# Patient Record
Sex: Male | Born: 1935 | Race: White | Hispanic: No | Marital: Married | Smoking: Former smoker
Health system: Southern US, Community
[De-identification: ages and names within clinical notes are randomized; demographics above are authoritative.]

## PROBLEM LIST (undated history)

## (undated) DIAGNOSIS — K5909 Other constipation: Secondary | ICD-10-CM

## (undated) DIAGNOSIS — I10 Essential (primary) hypertension: Secondary | ICD-10-CM

## (undated) DIAGNOSIS — Z9289 Personal history of other medical treatment: Secondary | ICD-10-CM

## (undated) DIAGNOSIS — N289 Disorder of kidney and ureter, unspecified: Secondary | ICD-10-CM

## (undated) DIAGNOSIS — C439 Malignant melanoma of skin, unspecified: Secondary | ICD-10-CM

## (undated) DIAGNOSIS — N189 Chronic kidney disease, unspecified: Secondary | ICD-10-CM

## (undated) DIAGNOSIS — R918 Other nonspecific abnormal finding of lung field: Secondary | ICD-10-CM

## (undated) HISTORY — DX: Malignant melanoma of skin, unspecified: C43.9

## (undated) HISTORY — DX: Other constipation: K59.09

## (undated) HISTORY — DX: Personal history of other medical treatment: Z92.89

## (undated) HISTORY — DX: Chronic kidney disease, unspecified: N18.9

## (undated) HISTORY — DX: Disorder of kidney and ureter, unspecified: N28.9

## (undated) HISTORY — DX: Essential (primary) hypertension: I10

## (undated) HISTORY — PX: GANGLION CYST EXCISION: SHX1691

## (undated) HISTORY — DX: Other nonspecific abnormal finding of lung field: R91.8

---

## 2000-07-03 ENCOUNTER — Encounter: Payer: Self-pay | Admitting: Family Medicine

## 2003-05-12 ENCOUNTER — Encounter: Admission: RE | Admit: 2003-05-12 | Discharge: 2003-05-12 | Payer: Self-pay | Admitting: Family Medicine

## 2003-07-31 ENCOUNTER — Encounter: Admission: RE | Admit: 2003-07-31 | Discharge: 2003-07-31 | Payer: Self-pay | Admitting: Family Medicine

## 2004-02-08 ENCOUNTER — Encounter: Admission: RE | Admit: 2004-02-08 | Discharge: 2004-02-08 | Payer: Self-pay | Admitting: Family Medicine

## 2004-03-09 ENCOUNTER — Ambulatory Visit: Payer: Self-pay | Admitting: Family Medicine

## 2004-05-11 ENCOUNTER — Ambulatory Visit: Payer: Self-pay | Admitting: Family Medicine

## 2004-08-03 ENCOUNTER — Ambulatory Visit: Payer: Self-pay | Admitting: Family Medicine

## 2004-08-18 ENCOUNTER — Ambulatory Visit: Payer: Self-pay | Admitting: Family Medicine

## 2004-09-29 ENCOUNTER — Ambulatory Visit: Payer: Self-pay | Admitting: Family Medicine

## 2005-02-07 ENCOUNTER — Ambulatory Visit: Payer: Self-pay | Admitting: Family Medicine

## 2005-02-09 ENCOUNTER — Ambulatory Visit: Payer: Self-pay | Admitting: Family Medicine

## 2005-02-24 ENCOUNTER — Ambulatory Visit: Payer: Self-pay

## 2005-03-06 ENCOUNTER — Ambulatory Visit: Payer: Self-pay | Admitting: Family Medicine

## 2005-06-12 ENCOUNTER — Ambulatory Visit: Payer: Self-pay | Admitting: Family Medicine

## 2005-08-14 ENCOUNTER — Ambulatory Visit: Payer: Self-pay | Admitting: Family Medicine

## 2005-08-16 ENCOUNTER — Ambulatory Visit: Payer: Self-pay | Admitting: Family Medicine

## 2006-02-16 ENCOUNTER — Ambulatory Visit: Payer: Self-pay | Admitting: Family Medicine

## 2006-02-16 LAB — CONVERTED CEMR LAB: PSA: 1.93 ng/mL

## 2006-02-27 ENCOUNTER — Ambulatory Visit: Payer: Self-pay | Admitting: Family Medicine

## 2006-03-01 ENCOUNTER — Ambulatory Visit: Payer: Self-pay | Admitting: Family Medicine

## 2006-03-13 ENCOUNTER — Ambulatory Visit: Payer: Self-pay | Admitting: Family Medicine

## 2006-05-29 ENCOUNTER — Ambulatory Visit: Payer: Self-pay | Admitting: Nephrology

## 2006-08-10 ENCOUNTER — Encounter: Payer: Self-pay | Admitting: Family Medicine

## 2006-08-10 DIAGNOSIS — N289 Disorder of kidney and ureter, unspecified: Secondary | ICD-10-CM | POA: Insufficient documentation

## 2006-08-10 DIAGNOSIS — I10 Essential (primary) hypertension: Secondary | ICD-10-CM | POA: Insufficient documentation

## 2006-08-10 DIAGNOSIS — E7800 Pure hypercholesterolemia, unspecified: Secondary | ICD-10-CM | POA: Insufficient documentation

## 2006-08-10 DIAGNOSIS — Z85828 Personal history of other malignant neoplasm of skin: Secondary | ICD-10-CM | POA: Insufficient documentation

## 2006-08-10 DIAGNOSIS — G2581 Restless legs syndrome: Secondary | ICD-10-CM | POA: Insufficient documentation

## 2006-08-10 DIAGNOSIS — J309 Allergic rhinitis, unspecified: Secondary | ICD-10-CM | POA: Insufficient documentation

## 2006-09-03 ENCOUNTER — Encounter: Payer: Self-pay | Admitting: Family Medicine

## 2006-12-04 ENCOUNTER — Ambulatory Visit: Payer: Self-pay | Admitting: Internal Medicine

## 2007-02-25 ENCOUNTER — Telehealth: Payer: Self-pay | Admitting: Family Medicine

## 2007-02-25 ENCOUNTER — Encounter: Payer: Self-pay | Admitting: Family Medicine

## 2007-04-23 ENCOUNTER — Telehealth: Payer: Self-pay | Admitting: Family Medicine

## 2007-05-28 ENCOUNTER — Ambulatory Visit: Payer: Self-pay | Admitting: Family Medicine

## 2007-05-29 LAB — CONVERTED CEMR LAB
ALT: 25 units/L (ref 0–53)
AST: 22 units/L (ref 0–37)
Albumin: 3.6 g/dL (ref 3.5–5.2)
BUN: 19 mg/dL (ref 6–23)
Basophils Absolute: 0.1 10*3/uL (ref 0.0–0.1)
Basophils Relative: 1.2 % — ABNORMAL HIGH (ref 0.0–1.0)
CO2: 26 meq/L (ref 19–32)
Calcium: 8.9 mg/dL (ref 8.4–10.5)
Chloride: 107 meq/L (ref 96–112)
Cholesterol: 175 mg/dL (ref 0–200)
Creatinine, Ser: 1.4 mg/dL (ref 0.4–1.5)
Eosinophils Absolute: 1 10*3/uL — ABNORMAL HIGH (ref 0.0–0.6)
Eosinophils Relative: 12 % — ABNORMAL HIGH (ref 0.0–5.0)
GFR calc Af Amer: 63 mL/min
GFR calc non Af Amer: 52 mL/min
Glucose, Bld: 94 mg/dL (ref 70–99)
HCT: 46.5 % (ref 39.0–52.0)
HDL: 35.5 mg/dL — ABNORMAL LOW (ref >39.0)
Hemoglobin: 15.4 g/dL (ref 13.0–17.0)
LDL Cholesterol: 118 mg/dL — ABNORMAL HIGH (ref 0–99)
Lymphocytes Relative: 26.5 % (ref 12.0–46.0)
MCHC: 33.2 g/dL (ref 30.0–36.0)
MCV: 91.3 fL (ref 78.0–100.0)
Monocytes Absolute: 0.6 10*3/uL (ref 0.2–0.7)
Monocytes Relative: 7 % (ref 3.0–11.0)
Neutro Abs: 4.3 10*3/uL (ref 1.4–7.7)
Neutrophils Relative %: 53.3 % (ref 43.0–77.0)
PSA: 1.57 ng/mL (ref 0.10–4.00)
Phosphorus: 3.1 mg/dL (ref 2.3–4.6)
Platelets: 203 10*3/uL (ref 150–400)
Potassium: 4.1 meq/L (ref 3.5–5.1)
RBC: 5.1 M/uL (ref 4.22–5.81)
RDW: 12.9 % (ref 11.5–14.6)
Sodium: 139 meq/L (ref 135–145)
TSH: 3.2 microintl units/mL (ref 0.35–5.50)
Total CHOL/HDL Ratio: 4.9
Triglycerides: 110 mg/dL (ref 0–149)
VLDL: 22 mg/dL (ref 0–40)
WBC: 8.2 10*3/uL (ref 4.5–10.5)

## 2007-06-04 ENCOUNTER — Ambulatory Visit: Payer: Self-pay | Admitting: Family Medicine

## 2007-06-05 LAB — FECAL OCCULT BLOOD, GUAIAC: Fecal Occult Blood: NEGATIVE

## 2007-09-16 ENCOUNTER — Encounter: Payer: Self-pay | Admitting: Family Medicine

## 2007-09-18 ENCOUNTER — Encounter: Payer: Self-pay | Admitting: Family Medicine

## 2007-09-26 ENCOUNTER — Ambulatory Visit: Payer: Self-pay | Admitting: Family Medicine

## 2007-09-30 DIAGNOSIS — Z9289 Personal history of other medical treatment: Secondary | ICD-10-CM

## 2007-09-30 HISTORY — DX: Personal history of other medical treatment: Z92.89

## 2007-09-30 HISTORY — PX: OTHER SURGICAL HISTORY: SHX169

## 2007-10-04 ENCOUNTER — Ambulatory Visit: Payer: Self-pay | Admitting: Family Medicine

## 2007-10-04 DIAGNOSIS — R609 Edema, unspecified: Secondary | ICD-10-CM | POA: Insufficient documentation

## 2007-10-04 DIAGNOSIS — H1045 Other chronic allergic conjunctivitis: Secondary | ICD-10-CM | POA: Insufficient documentation

## 2007-10-07 DIAGNOSIS — R930 Abnormal findings on diagnostic imaging of skull and head, not elsewhere classified: Secondary | ICD-10-CM | POA: Insufficient documentation

## 2007-10-10 ENCOUNTER — Encounter: Payer: Self-pay | Admitting: Family Medicine

## 2007-10-30 ENCOUNTER — Encounter: Payer: Self-pay | Admitting: Family Medicine

## 2007-10-30 ENCOUNTER — Ambulatory Visit: Payer: Self-pay

## 2007-11-14 ENCOUNTER — Ambulatory Visit: Payer: Self-pay | Admitting: Family Medicine

## 2007-11-15 LAB — CONVERTED CEMR LAB
Albumin: 3.9 g/dL (ref 3.5–5.2)
BUN: 20 mg/dL (ref 6–23)
CO2: 24 meq/L (ref 19–32)
Calcium: 9.2 mg/dL (ref 8.4–10.5)
Chloride: 104 meq/L (ref 96–112)
Creatinine, Ser: 1.4 mg/dL (ref 0.4–1.5)
GFR calc Af Amer: 63 mL/min
GFR calc non Af Amer: 52 mL/min
Glucose, Bld: 96 mg/dL (ref 70–99)
Phosphorus: 3.2 mg/dL (ref 2.3–4.6)
Potassium: 4.7 meq/L (ref 3.5–5.1)
Sed Rate: 8 mm/hr (ref 0–16)
Sodium: 137 meq/L (ref 135–145)

## 2007-11-18 ENCOUNTER — Encounter: Payer: Self-pay | Admitting: Family Medicine

## 2007-11-22 ENCOUNTER — Ambulatory Visit: Payer: Self-pay | Admitting: Family Medicine

## 2007-11-25 ENCOUNTER — Telehealth: Payer: Self-pay | Admitting: Family Medicine

## 2007-11-27 ENCOUNTER — Encounter: Payer: Self-pay | Admitting: Family Medicine

## 2007-12-30 ENCOUNTER — Ambulatory Visit: Payer: Self-pay | Admitting: Family Medicine

## 2008-01-10 ENCOUNTER — Telehealth (INDEPENDENT_AMBULATORY_CARE_PROVIDER_SITE_OTHER): Payer: Self-pay | Admitting: *Deleted

## 2008-02-24 ENCOUNTER — Ambulatory Visit: Payer: Self-pay | Admitting: Family Medicine

## 2008-02-25 LAB — CONVERTED CEMR LAB
ALT: 23 units/L (ref 0–53)
AST: 22 units/L (ref 0–37)
Albumin: 3.6 g/dL (ref 3.5–5.2)
Alkaline Phosphatase: 76 units/L (ref 39–117)
BUN: 21 mg/dL (ref 6–23)
Bilirubin, Direct: 0.1 mg/dL (ref 0.0–0.3)
CO2: 29 meq/L (ref 19–32)
Calcium: 8.7 mg/dL (ref 8.4–10.5)
Chloride: 110 meq/L (ref 96–112)
Cholesterol: 169 mg/dL (ref 0–200)
Creatinine, Ser: 1.3 mg/dL (ref 0.4–1.5)
GFR calc Af Amer: 68 mL/min
GFR calc non Af Amer: 56 mL/min
Glucose, Bld: 94 mg/dL (ref 70–99)
HDL: 36.6 mg/dL — ABNORMAL LOW (ref >39.0)
LDL Cholesterol: 116 mg/dL — ABNORMAL HIGH (ref 0–99)
Phosphorus: 2.8 mg/dL (ref 2.3–4.6)
Potassium: 4.3 meq/L (ref 3.5–5.1)
Sodium: 144 meq/L (ref 135–145)
Total Bilirubin: 0.8 mg/dL (ref 0.3–1.2)
Total CHOL/HDL Ratio: 4.6
Total Protein: 6.5 g/dL (ref 6.0–8.3)
Triglycerides: 81 mg/dL (ref 0–149)
VLDL: 16 mg/dL (ref 0–40)

## 2008-03-02 ENCOUNTER — Ambulatory Visit: Payer: Self-pay | Admitting: Family Medicine

## 2008-06-23 ENCOUNTER — Encounter: Payer: Self-pay | Admitting: Family Medicine

## 2008-07-22 ENCOUNTER — Encounter: Payer: Self-pay | Admitting: Family Medicine

## 2008-09-01 ENCOUNTER — Encounter (INDEPENDENT_AMBULATORY_CARE_PROVIDER_SITE_OTHER): Payer: Self-pay | Admitting: *Deleted

## 2008-09-03 ENCOUNTER — Ambulatory Visit: Payer: Self-pay | Admitting: Family Medicine

## 2008-10-14 ENCOUNTER — Ambulatory Visit: Payer: Self-pay | Admitting: Family Medicine

## 2009-02-01 ENCOUNTER — Ambulatory Visit: Payer: Self-pay | Admitting: Family Medicine

## 2009-02-02 LAB — CONVERTED CEMR LAB
ALT: 18 units/L (ref 0–53)
AST: 19 units/L (ref 0–37)
Albumin: 3.7 g/dL (ref 3.5–5.2)
BUN: 25 mg/dL — ABNORMAL HIGH (ref 6–23)
Basophils Absolute: 0 10*3/uL (ref 0.0–0.1)
Basophils Relative: 0.1 % (ref 0.0–3.0)
CO2: 29 meq/L (ref 19–32)
Calcium: 9.1 mg/dL (ref 8.4–10.5)
Chloride: 110 meq/L (ref 96–112)
Cholesterol: 156 mg/dL (ref 0–200)
Creatinine, Ser: 1.3 mg/dL (ref 0.4–1.5)
Eosinophils Absolute: 0.5 10*3/uL (ref 0.0–0.7)
Eosinophils Relative: 6.6 % — ABNORMAL HIGH (ref 0.0–5.0)
GFR calc non Af Amer: 56.17 mL/min (ref >60)
Glucose, Bld: 90 mg/dL (ref 70–99)
HCT: 45.7 % (ref 39.0–52.0)
HDL: 36.2 mg/dL — ABNORMAL LOW (ref >39.00)
Hemoglobin: 15.4 g/dL (ref 13.0–17.0)
LDL Cholesterol: 103 mg/dL — ABNORMAL HIGH (ref 0–99)
Lymphocytes Relative: 28 % (ref 12.0–46.0)
Lymphs Abs: 2.2 10*3/uL (ref 0.7–4.0)
MCHC: 33.7 g/dL (ref 30.0–36.0)
MCV: 93.1 fL (ref 78.0–100.0)
Monocytes Absolute: 0.6 10*3/uL (ref 0.1–1.0)
Monocytes Relative: 7.3 % (ref 3.0–12.0)
Neutro Abs: 4.5 10*3/uL (ref 1.4–7.7)
Neutrophils Relative %: 58 % (ref 43.0–77.0)
Phosphorus: 2.8 mg/dL (ref 2.3–4.6)
Platelets: 171 10*3/uL (ref 150.0–400.0)
Potassium: 4.6 meq/L (ref 3.5–5.1)
RBC: 4.9 M/uL (ref 4.22–5.81)
RDW: 12.7 % (ref 11.5–14.6)
Sodium: 143 meq/L (ref 135–145)
TSH: 2.82 microintl units/mL (ref 0.35–5.50)
Total CHOL/HDL Ratio: 4
Triglycerides: 86 mg/dL (ref 0.0–149.0)
VLDL: 17.2 mg/dL (ref 0.0–40.0)
WBC: 7.8 10*3/uL (ref 4.5–10.5)

## 2009-06-09 ENCOUNTER — Ambulatory Visit: Payer: Self-pay | Admitting: Family Medicine

## 2009-06-09 DIAGNOSIS — R42 Dizziness and giddiness: Secondary | ICD-10-CM | POA: Insufficient documentation

## 2009-07-26 ENCOUNTER — Encounter: Payer: Self-pay | Admitting: Family Medicine

## 2010-01-06 ENCOUNTER — Ambulatory Visit: Payer: Self-pay | Admitting: Family Medicine

## 2010-01-06 LAB — CONVERTED CEMR LAB
ALT: 19 units/L (ref 0–53)
AST: 20 units/L (ref 0–37)
Albumin: 3.7 g/dL (ref 3.5–5.2)
BUN: 21 mg/dL (ref 6–23)
Basophils Absolute: 0.1 10*3/uL (ref 0.0–0.1)
Basophils Relative: 0.6 % (ref 0.0–3.0)
CO2: 28 meq/L (ref 19–32)
Calcium: 9 mg/dL (ref 8.4–10.5)
Chloride: 108 meq/L (ref 96–112)
Cholesterol: 152 mg/dL (ref 0–200)
Creatinine, Ser: 1.2 mg/dL (ref 0.4–1.5)
Eosinophils Absolute: 0.4 10*3/uL (ref 0.0–0.7)
Eosinophils Relative: 4.3 % (ref 0.0–5.0)
GFR calc non Af Amer: 60.88 mL/min (ref >60)
Glucose, Bld: 87 mg/dL (ref 70–99)
HCT: 42.2 % (ref 39.0–52.0)
HDL: 39.7 mg/dL (ref >39.00)
Hemoglobin: 14.3 g/dL (ref 13.0–17.0)
LDL Cholesterol: 103 mg/dL — ABNORMAL HIGH (ref 0–99)
Lymphocytes Relative: 29.4 % (ref 12.0–46.0)
Lymphs Abs: 2.4 10*3/uL (ref 0.7–4.0)
MCHC: 33.8 g/dL (ref 30.0–36.0)
MCV: 93.3 fL (ref 78.0–100.0)
Monocytes Absolute: 0.6 10*3/uL (ref 0.1–1.0)
Monocytes Relative: 6.7 % (ref 3.0–12.0)
Neutro Abs: 4.9 10*3/uL (ref 1.4–7.7)
Neutrophils Relative %: 59 % (ref 43.0–77.0)
Phosphorus: 3 mg/dL (ref 2.3–4.6)
Platelets: 186 10*3/uL (ref 150.0–400.0)
Potassium: 4.1 meq/L (ref 3.5–5.1)
RBC: 4.53 M/uL (ref 4.22–5.81)
RDW: 13.6 % (ref 11.5–14.6)
Sodium: 144 meq/L (ref 135–145)
TSH: 3.1 microintl units/mL (ref 0.35–5.50)
Total CHOL/HDL Ratio: 4
Triglycerides: 49 mg/dL (ref 0.0–149.0)
VLDL: 9.8 mg/dL (ref 0.0–40.0)
WBC: 8.3 10*3/uL (ref 4.5–10.5)

## 2010-01-12 ENCOUNTER — Ambulatory Visit: Payer: Self-pay | Admitting: Family Medicine

## 2010-01-12 DIAGNOSIS — K5909 Other constipation: Secondary | ICD-10-CM | POA: Insufficient documentation

## 2010-01-24 ENCOUNTER — Ambulatory Visit: Payer: Self-pay | Admitting: Family Medicine

## 2010-02-28 ENCOUNTER — Encounter: Payer: Self-pay | Admitting: Family Medicine

## 2010-03-18 ENCOUNTER — Ambulatory Visit: Payer: Self-pay | Admitting: Gastroenterology

## 2010-03-18 ENCOUNTER — Encounter: Payer: Self-pay | Admitting: Family Medicine

## 2010-03-18 LAB — HM COLONOSCOPY: HM Colonoscopy: NORMAL

## 2010-05-31 NOTE — Procedures (Signed)
Summary: Colonoscopy by Dr.[facility]  Colonoscopy by Dr.Martin [facility]   Imported By: Beau Fanny 03/23/2010 16:35:12  _____________________________________________________________________  External Attachment:    Type:   Image     Comment:   External Document  Appended Document: Colonoscopy by Dr.[facility]     Clinical Lists Changes  Observations: Added new observation of PAST SURG HX: EGD, Colonoscopy- neg. 3/02 nl colonoscopy Ganglion cyst removal- hands 2D Echo- mild LVH (05/2002) Pulmonary nodules on CT (07/2003) 6 month follow-up CT- stable pulmonary nodules (01/2004) Stress Myoview- neg. (01/2005) Renal US- neg., benign app. cysts (05/2006) CT chest with calcification consistant with asbestos exp, also pleural nodule(6/09)- rec re check 6 mo, incidental gallstones also seen on exam 2D echo 6/09 slt thickening of aortic valve with EF of 60 % CT of head- small vessel isch change, some sinus polyps 2011 redundant colon on colonoscopy  (03/27/2010 13:22) Added new observation of PAST MED HX: Allergic rhinitis Blood transfusion Skin cancer, hx of- melanoma by right eye Hypertension Renal insufficiency chronic kidney diseas stage 3 pulmonary nodules (being obs with serial CT) chronic constipation  renal Dr Dehmer-- Lucienne Minks  GI- Dr Marva Panda (03/27/2010 13:22) Added new observation of COLONOSCOPY: normal (03/18/2010 13:22)       Preventive Care Screening  Colonoscopy:    Date:  03/18/2010    Results:  normal   Past History:  Past Medical History: Allergic rhinitis Blood transfusion Skin cancer, hx of- melanoma by right eye Hypertension Renal insufficiency chronic kidney diseas stage 3 pulmonary nodules (being obs with serial CT) chronic constipation  renal Dr Dehmer-- Lucienne Minks  GI- Dr Marva Panda  Past Surgical History: EGD, Colonoscopy- neg. 3/02 nl colonoscopy Ganglion cyst removal- hands 2D Echo- mild LVH  (05/2002) Pulmonary nodules on CT (07/2003) 6 month follow-up CT- stable pulmonary nodules (01/2004) Stress Myoview- neg. (01/2005) Renal US- neg., benign app. cysts (05/2006) CT chest with calcification consistant with asbestos exp, also pleural nodule(6/09)- rec re check 6 mo, incidental gallstones also seen on exam 2D echo 6/09 slt thickening of aortic valve with EF of 60 % CT of head- small vessel isch change, some sinus polyps 2011 redundant colon on colonoscopy

## 2010-05-31 NOTE — Assessment & Plan Note (Signed)
Summary: ROA 6 MTHS CYD   Vital Signs:  Patient profile:   77 year old male Height:      66.75 inches Weight:      163.25 pounds BMI:     25.85 Temp:     98.3 degrees F oral Pulse rate:   64 / minute Pulse rhythm:   regular BP sitting:   126 / 80  (left arm) Cuff size:   regular  Vitals Entered By: Lewanda Rife LPN (January 12, 2010 8:10 AM) CC: six month f/u   History of Present Illness: here for f/u of HTN and lipids and renal insuff  wt is down 8 lb with good bmi of 25  renal insuff is stable with cr 1.2-- thought to be HTN and age rel sees renal  was changed to chorthalidone for HTN and edema -- he stopped it due to too frequent urination was on lasix and doxasosin got new machine at home and bp are generally 114-130s/80s   (one time 138/90- then it came down)  bp today is 126/80  just on the amlodipine now  not swollen at home -- has hardly needed any lasix in the last year -- only uses for swelling  he thinks his new machine is much more accurate  bp today is excellent     wants flu shot today   lipids stable Last Lipid ProfileCholesterol: 152 (01/06/2010 8:48:18 AM)HDL:  39.70 (01/06/2010 8:48:18 AM)LDL:  103 (01/06/2010 8:48:18 AM)Triglycerides:  Last Liver profileSGOT:  20 (01/06/2010 8:48:18 AM)SPGT:  19 (01/06/2010 8:48:18 AM)T. Bili:  0.8 (02/24/2008 11:06:00 AM)Alk Phos:  76 (02/24/2008 11:06:00 AM)   bp good today at 126/80  pneumovax up to date   ? zoster status    Allergies: 1)  ! Penicillin 2)  ! Codeine 3)  ! Iodine 4)  ! * Chlorthalidone 5)  * Altace 6)  * Requip  Past History:  Past Surgical History: Last updated: 11/20/2007 EGD, Colonoscopy- neg. 3/02 nl colonoscopy Ganglion cyst removal- hands 2D Echo- mild LVH (05/2002) Pulmonary nodules on CT (07/2003) 6 month follow-up CT- stable pulmonary nodules (01/2004) Stress Myoview- neg. (01/2005) Renal US- neg., benign app. cysts (05/2006) CT chest with calcification consistant  with asbestos exp, also pleural nodule(6/09)- rec re check 6 mo, incidental gallstones also seen on exam 2D echo 6/09 slt thickening of aortic valve with EF of 60 % CT of head- small vessel isch change, some sinus polyps  Family History: Last updated: 05/28/2007 father HTN, CVA mother HTN brother with pancreatic ca  Social History: Last updated: 10/04/2007 quit smoking in 1957 Patient is a former smoker. --quit in 1950s plays music with gospel/bluegrass group  Risk Factors: Smoking Status: quit (08/10/2006)  Past Medical History: Allergic rhinitis Blood transfusion Skin cancer, hx of- melanoma by right eye Hypertension Renal insufficiency chronic kidney diseas stage 3 pulmonary nodules (being obs with serial CT) chronic constipation  renal Dr Dehmer-- Lucienne Minks   Review of Systems General:  Denies chills, fatigue, fever, loss of appetite, and malaise. Eyes:  Denies blurring. CV:  Denies chest pain or discomfort, lightheadness, and palpitations. Resp:  Denies cough, shortness of breath, and wheezing. GI:  Denies abdominal pain, change in bowel habits, indigestion, and nausea. MS:  Complains of stiffness; denies muscle aches, cramps, and muscle weakness. Derm:  Denies itching, lesion(s), poor wound healing, and rash. Neuro:  Denies numbness and tingling. Psych:  Denies anxiety and depression. Endo:  Denies cold intolerance, excessive thirst, excessive urination, and heat intolerance.  Heme:  Denies abnormal bruising and bleeding.  Physical Exam  General:  elderly male in no distress  Head:  normocephalic, atraumatic, and no abnormalities observed.   Eyes:  vision grossly intact, pupils equal, pupils round, and pupils reactive to light.  no conjunctival pallor, injection or icterus  Ears:  R ear normal and L ear normal.   Neck:  supple with full rom and no masses or thyromegally, no JVD or carotid bruit  Lungs:  Normal respiratory effort, chest expands symmetrically. Lungs  are clear to auscultation, no crackles or wheezes. Heart:  Normal rate and regular rhythm. S1 and S2 normal without gallop, murmur, click, rub or other extra sounds. Abdomen:  Bowel sounds positive,abdomen soft and non-tender without masses, organomegaly or hernias noted. no renal bruits  Msk:  No deformity or scoliosis noted of thoracic or lumbar spine.   Pulses:  R and L carotid,radial,femoral,dorsalis pedis and posterior tibial pulses are full and equal bilaterally Extremities:  trace pedal edema bilat   Neurologic:  sensation intact to light touch, gait normal, and DTRs symmetrical and normal.   Skin:  Intact without suspicious lesions or rashes Cervical Nodes:  No lymphadenopathy noted Inguinal Nodes:  No significant adenopathy Psych:  normal affect, talkative and pleasant    Impression & Recommendations:  Problem # 1:  Hx of HYPERCHOLESTEROLEMIA (ICD-272.0) Assessment Unchanged  this is stable on lipitor and diet  rev low sat fat diet and emph imp of exercise 6 mo lab and f/u for check up planned His updated medication list for this problem includes:    Lipitor 40 Mg Tabs (Atorvastatin calcium) .Marland Kitchen... Take one by mouth daily  Labs Reviewed: SGOT: 20 (01/06/2010)   SGPT: 19 (01/06/2010)   HDL:39.70 (01/06/2010), 36.20 (02/01/2009)  LDL:103 (01/06/2010), 103 (02/01/2009)  Chol:152 (01/06/2010), 156 (02/01/2009)  Trig:49.0 (01/06/2010), 86.0 (02/01/2009)  Orders: Prescription Created Electronically [phone])  Problem # 2:  EDEMA (ICD-782.3) Assessment: Improved this is very infrequent -- uses lasix rarely His updated medication list for this problem includes:    Lasix 20 Mg Tabs (Furosemide) .Marland Kitchen... 1 by mouth once daily as needed swelling  Problem # 3:  RENAL INSUFFICIENCY (ICD-588.9) Assessment: Improved  improved cr of 1.2  pt will update renal dr of bp off chlorthalidone  Orders: Prescription Created Electronically [id number])  Problem # 4:  HYPERTENSION  (ICD-401.9) Assessment: Improved  bp here and at home are generally imp off doxazosin and chlorthalidone  pt will call renal with readings and further inst  emph imp of exercise and good water intake  The following medications were removed from the medication list:    Doxazosin Mesylate 1 Mg Tabs (Doxazosin mesylate) ..... One by mouth twice a day His updated medication list for this problem includes:    Lasix 20 Mg Tabs (Furosemide) .Marland Kitchen... 1 by mouth once daily as needed swelling    Norvasc 5 Mg Tabs (Amlodipine besylate) .Marland Kitchen... 1 by mouth once daily in ams  BP today: 126/80 Prior BP: 126/74 (06/09/2009)  Labs Reviewed: K+: 4.1 (01/06/2010) Creat: : 1.2 (01/06/2010)   Chol: 152 (01/06/2010)   HDL: 39.70 (01/06/2010)   LDL: 103 (01/06/2010)   TG: 49.0 (01/06/2010)  Orders: Prescription Created Electronically [phone])  Problem # 5:  CONSTIPATION, CHRONIC (ICD-564.09) Assessment: Deteriorated  adv inc water and trial of miralax daily - instead of prn  Orders: Prescription Created Electronically [phone])  Complete Medication List: 1)  Lipitor 40 Mg Tabs (Atorvastatin calcium) .... Take one by mouth daily 2)  Adult  Aspirin Low Strength 81 Mg Tbdp (Aspirin) .... One by mouth daily 3)  Fish Oil 1200 Mg Caps (Omega-3 fatty acids) .... One by mouth daily 4)  Lasix 20 Mg Tabs (Furosemide) .Marland Kitchen.. 1 by mouth once daily as needed swelling 5)  Vitamin D 1000 Iu  .Marland Kitchen.. 1 by mouth once daily 6)  Colace  .Marland KitchenMarland Kitchen. 1 by mouth  every ha as needed 7)  Norvasc 5 Mg Tabs (Amlodipine besylate) .Marland Kitchen.. 1 by mouth once daily in ams  Other Orders: Flu Vaccine 29yrs + MEDICARE PATIENTS (Z6109) Administration Flu vaccine - MCR (U0454)  Patient Instructions: 1)  call your renal doctor when you get home -- let them know you have stopped the chlorthalidone due to frequent urination, still off the doxazosin and off lasix except rarely as needed  2)  let them know your blood pressure  3)  if you are interested in a  shingles vaccine -- check on your coverage with your insurance company and then call us back in about a month to schedule  4)  schedule fasting labs in 6 months lipid/ast/alt/renal/ psa/ for 401.1, 272 and prostate screen-- then f/u for 30 minute check up  Prescriptions: NORVASC 5 MG TABS (AMLODIPINE BESYLATE) 1 by mouth once daily in ams  #30 x 11   Entered and Authorized by:   Judith Part MD   Signed by:   Judith Part MD on 01/12/2010   Method used:   Electronically to        [id number]       [address]       [place], [place]  [address]       Ph: [phone] or [phone]       Fax: [phone]   RxID:   [id number] LASIX 20 MG  TABS (FUROSEMIDE) 1 by mouth once daily as needed swelling  #30 x 3   Entered and Authorized by:   Judith Part MD   Signed by:   Judith Part MD on 01/12/2010   Method used:   [facility] to        [id number]       [address]       [place], [place]  [address]       Ph: [phone] or [phone]       Fax: [phone]   RxID:   [id number] LIPITOR 40 MG  TABS (ATORVASTATIN CALCIUM) take one by mouth daily  #30 x 11   Entered and Authorized by:   Judith Part MD   Signed by:   Judith Part MD on 01/12/2010   Method used:   [facility] to        [id number]       [address]       [place], [place]  [address]       Ph: [phone] or [phone]       Fax: [phone]   RxID:   [id number]   Current Allergies (reviewed today): ! PENICILLIN ! CODEINE ! IODINE ! * CHLORTHALIDONE * ALTACE * REQUIP       Flu Vaccine Consent Questions     Do you have a history of severe allergic reactions to this vaccine? no    Any  prior history of allergic reactions to egg and/or gelatin? no    Do you have a sensitivity to the preservative Thimersol? no    Do you have a past history of Guillan-Barre Syndrome? no    Do you  currently have an acute febrile illness? no    Have you ever had a severe reaction to latex? no    Vaccine information given and explained to patient? yes    Are you currently pregnant? no    Lot Number:[id number]   Exp Date:10/29/2010   Site Given  Left Deltoid IMedflu Lewanda Rife LPN  January 12, 2010 9:12 AM

## 2010-05-31 NOTE — Assessment & Plan Note (Signed)
Summary: FOLLOW UP / LFW   Vital Signs:  Patient profile:   77 year old male Height:      66.75 inches Weight:      171.25 pounds BMI:     27.12 Temp:     97.6 degrees F oral Pulse rate:   64 / minute Pulse rhythm:   irregular BP sitting:   126 / 74  (left arm) Cuff size:   regular  Vitals Entered By: Lewanda Rife LPN (June 09, 2009 10:59 AM)  History of Present Illness: here for f/u of HTN and lipid and renal insuff  is feeling pretty good overall  trying to stay healthy   lipids last were good with LDL 103 in the fall  bp 126/74 today- even better with addn of norvasc  no trouble with meds at all  is trying to stay active - harder in the winter  has an exercise machine -- has started using it   wt is stable swelling is in good control   more vertigo this week  this is the time of year it flares  no headache or nausea  took dramamine twice   Allergies: 1)  ! Penicillin 2)  ! Codeine 3)  ! Iodine 4)  * Altace 5)  * Requip  Past History:  Past Medical History: Last updated: 09/03/2008 Allergic rhinitis Blood transfusion Skin cancer, hx of- melanoma by right eye Hypertension Renal insufficiency chronic kidney diseas stage 3 pulmonary nodules (being obs with serial CT)  renal Dr Dehmer-- Lucienne Minks   Past Surgical History: Last updated: 11/20/2007 EGD, Colonoscopy- neg. 3/02 nl colonoscopy Ganglion cyst removal- hands 2D Echo- mild LVH (05/2002) Pulmonary nodules on CT (07/2003) 6 month follow-up CT- stable pulmonary nodules (01/2004) Stress Myoview- neg. (01/2005) Renal US- neg., benign app. cysts (05/2006) CT chest with calcification consistant with asbestos exp, also pleural nodule(6/09)- rec re check 6 mo, incidental gallstones also seen on exam 2D echo 6/09 slt thickening of aortic valve with EF of 60 % CT of head- small vessel isch change, some sinus polyps  Family History: Last updated: 05/28/2007 father HTN, CVA mother HTN brother with  pancreatic ca  Social History: Last updated: 10/04/2007 quit smoking in 1957 Patient is a former smoker. --quit in 1950s plays music with gospel/bluegrass group  Risk Factors: Smoking Status: quit (08/10/2006)  Review of Systems General:  Denies fatigue, fever, loss of appetite, and malaise. Eyes:  Denies blurring, double vision, and eye irritation. ENT:  Complains of postnasal drainage; denies ear discharge, earache, nasal congestion, sinus pressure, and sore throat. CV:  Denies chest pain or discomfort, palpitations, and shortness of breath with exertion. Resp:  Denies cough, shortness of breath, and wheezing. GI:  Denies change in bowel habits, nausea, and vomiting. GU:  Denies urinary frequency. MS:  Denies joint pain. Derm:  Denies itching, lesion(s), poor wound healing, and rash. Neuro:  Complains of sensation of room spinning; denies difficulty with concentration, falling down, headaches, numbness, tingling, tremors, visual disturbances, and weakness. Psych:  Denies anxiety and depression. Endo:  Denies cold intolerance, excessive thirst, excessive urination, and heat intolerance. Heme:  Denies abnormal bruising and bleeding.  Physical Exam  General:  Well-developed,well-nourished,in no acute distress; alert,appropriate and cooperative throughout examination Head:  normocephalic, atraumatic, and no abnormalities observed.   Eyes:  vision grossly intact, pupils equal, pupils round, and pupils reactive to light.  no conjunctival pallor, injection or icterus  Mouth:  pharynx pink and moist.   Neck:  supple with full rom and no masses or thyromegally, no JVD or carotid bruit  Lungs:  Normal respiratory effort, chest expands symmetrically. Lungs are clear to auscultation, no crackles or wheezes. Heart:  Normal rate and regular rhythm. S1 and S2 normal without gallop, murmur, click, rub or other extra sounds. Abdomen:  Bowel sounds positive,abdomen soft and non-tender without  masses, organomegaly or hernias noted. no renal bruits  Msk:  No deformity or scoliosis noted of thoracic or lumbar spine.   Pulses:  R and L carotid,radial,femoral,dorsalis pedis and posterior tibial pulses are full and equal bilaterally Extremities:  trace pedal edema bilat   Neurologic:  sensation intact to light touch, gait normal, and DTRs symmetrical and normal.   Skin:  Intact without suspicious lesions or rashes Cervical Nodes:  No lymphadenopathy noted Inguinal Nodes:  No significant adenopathy Psych:  normal affect, talkative and pleasant    Impression & Recommendations:  Problem # 1:  Hx of HYPERCHOLESTEROLEMIA (ICD-272.0) Assessment Unchanged  well controlled with lipitor -- will continue that  rev low sat fat diet  lab and f/u 6 mo  His updated medication list for this problem includes:    Lipitor 40 Mg Tabs (Atorvastatin calcium) .Marland Kitchen... Take one by mouth daily  Labs Reviewed: SGOT: 19 (02/01/2009)   SGPT: 18 (02/01/2009)   HDL:36.20 (02/01/2009), 36.6 (02/24/2008)  LDL:103 (02/01/2009), 116 (02/24/2008)  Chol:156 (02/01/2009), 169 (02/24/2008)  Trig:86.0 (02/01/2009), 81 (02/24/2008)  Problem # 2:  HYPERTENSION (ICD-401.9) Assessment: Unchanged  bp continues to improve -- with addn of norvasc edema is minimal as well rev low sodium diet  f/u 6 mo  His updated medication list for this problem includes:    Doxazosin Mesylate 1 Mg Tabs (Doxazosin mesylate) ..... One by mouth twice a day    Lasix 20 Mg Tabs (Furosemide) .Marland Kitchen... 1 by mouth once daily as needed swelling    Norvasc 5 Mg Tabs (Amlodipine besylate) .Marland Kitchen... 1 by mouth once daily in ams  BP today: 126/74 Prior BP: 134/80 (10/14/2008)  Labs Reviewed: K+: 4.6 (02/01/2009) Creat: : 1.3 (02/01/2009)   Chol: 156 (02/01/2009)   HDL: 36.20 (02/01/2009)   LDL: 103 (02/01/2009)   TG: 86.0 (02/01/2009)  Orders: Prescription Created Electronically [phone])  Problem # 3:  RENAL INSUFFICIENCY (ICD-588.9) Assessment:  Unchanged stable on current med will continue aggressive bp and chol control f/u renal upcoming   Problem # 4:  VERTIGO (ICD-780.4) Assessment: Unchanged mild and intermittent disc imp of changing position slowly  meclizine as needed  update if worse or other symptoms   Complete Medication List: 1)  Lipitor 40 Mg Tabs (Atorvastatin calcium) .... Take one by mouth daily 2)  Doxazosin Mesylate 1 Mg Tabs (Doxazosin mesylate) .... One by mouth twice a day 3)  Adult Aspirin Low Strength 81 Mg Tbdp (Aspirin) .... One by mouth daily 4)  Fish Oil 1200 Mg Caps (Omega-3 fatty acids) .... One by mouth daily 5)  Lasix 20 Mg Tabs (Furosemide) .Marland Kitchen.. 1 by mouth once daily as needed swelling 6)  Vitamin D 1000 Iu  .Marland Kitchen.. 1 by mouth once daily 7)  Colace  .Marland KitchenMarland Kitchen. 1 by mouth  every ha as needed 8)  Norvasc 5 Mg Tabs (Amlodipine besylate) .Marland Kitchen.. 1 by mouth once daily in ams  Patient Instructions: 1)  no change in medication  2)  keep up the exercise at home until you can get out in the spring  3)  keep working on healthy low fat diet  4)  blood  pressure is well controlled  5)  schedule fasting lab in 6 months and then follow up lipid/ast/alt/renal/cbc with diff/ tsh 401.1, 272  Prescriptions: LASIX 20 MG  TABS (FUROSEMIDE) 1 by mouth once daily as needed swelling  #30 x 11   Entered and Authorized by:   Judith Part MD   Signed by:   Judith Part MD on 06/09/2009   Method used:   [facility] to        [id number]       [address]       [place], [place]  [address]       Ph: [phone] or [phone]       Fax: [phone]   RxID:   [id number] DOXAZOSIN MESYLATE 1 MG  TABS (DOXAZOSIN MESYLATE) one by mouth twice a day  #60 x 11   Entered and Authorized by:   Judith Part MD   Signed by:   Judith Part MD on 06/09/2009   Method used:   [facility] to        [id number]       [address]       [place], [place]   [address]       Ph: [phone] or [phone]       Fax: [phone]   RxID:   [id number]   Current Allergies (reviewed today): ! PENICILLIN ! CODEINE ! IODINE * ALTACE * REQUIP   Influenza Immunization History:    Influenza # 1:  Fluvax 3+ (03/03/2009)

## 2010-05-31 NOTE — Letter (Signed)
Summary: [facility]  [facility]   Imported By: Lanelle Bal 08/05/2009 13:51:58  _____________________________________________________________________  External Attachment:    Type:   Image     Comment:   External Document

## 2010-05-31 NOTE — Consult Note (Signed)
Summary: [facility]  [facility]   Imported By: Lanelle Bal 03/14/2010 11:45:21  _____________________________________________________________________  External Attachment:    Type:   Image     Comment:   External Document

## 2010-05-31 NOTE — Assessment & Plan Note (Signed)
Summary: STOMACH/CLE   Vital Signs:  Patient profile:   77 year old male Height:      66.75 inches Weight:      161.50 pounds BMI:     25.58 Temp:     97.8 degrees F oral Pulse rate:   68 / minute Pulse rhythm:   regular BP sitting:   140 / 82  (left arm) Cuff size:   regular  Vitals Entered By: Lewanda Rife LPN (January 24, 2010 9:33 AM) CC: No normal BM since 1st week in August. Pt taking MIralax, OTC supp, Colace and drinking prune juice. Pt nauseated for 8 days.   History of Present Illness: is very constipated   nausea every am for past 8-10 days  has not had a good bm since august -- very small amounts  uses suppositories occas   has used mag citrate - and that really helped  did it again a week later (which did help significantly)  takes colace every night -- and that makes stool soft   has a very small bowel movement every day  uses suppository 3-4 times per week -- ? what brand / was not dulcolax   no blood in stool  uses miralax every day except this am   a little fullness in lower abdomen  no cramps  just nausea - not vomiting   is eating cereal and prunes and apples  trying to drink lots of water -- less than 8 glasses - but has apple juice and coffee daily    Allergies: 1)  ! Penicillin 2)  ! Codeine 3)  ! Iodine 4)  ! * Chlorthalidone 5)  * Altace 6)  * Requip  Past History:  Past Medical History: Last updated: 01/12/2010 Allergic rhinitis Blood transfusion Skin cancer, hx of- melanoma by right eye Hypertension Renal insufficiency chronic kidney diseas stage 3 pulmonary nodules (being obs with serial CT) chronic constipation  renal Dr Dehmer-- Lucienne Minks   Past Surgical History: Last updated: 11/20/2007 EGD, Colonoscopy- neg. 3/02 nl colonoscopy Ganglion cyst removal- hands 2D Echo- mild LVH (05/2002) Pulmonary nodules on CT (07/2003) 6 month follow-up CT- stable pulmonary nodules (01/2004) Stress Myoview- neg. (01/2005) Renal US-  neg., benign app. cysts (05/2006) CT chest with calcification consistant with asbestos exp, also pleural nodule(6/09)- rec re check 6 mo, incidental gallstones also seen on exam 2D echo 6/09 slt thickening of aortic valve with EF of 60 % CT of head- small vessel isch change, some sinus polyps  Family History: Last updated: 05/28/2007 father HTN, CVA mother HTN brother with pancreatic ca  Social History: Last updated: 10/04/2007 quit smoking in 1957 Patient is a former smoker. --quit in 1950s plays music with gospel/bluegrass group  Risk Factors: Smoking Status: quit (08/10/2006)  Review of Systems General:  Denies chills, fatigue, and fever. Eyes:  Denies blurring and eye irritation. CV:  Denies lightheadness. Resp:  Denies cough and shortness of breath. GI:  Complains of abdominal pain, change in bowel habits, constipation, gas, and nausea; denies bloody stools, indigestion, and vomiting. GU:  Denies dysuria and urinary frequency. MS:  Denies cramps and muscle weakness. Derm:  Denies itching, lesion(s), poor wound healing, and rash. Neuro:  Denies numbness and tingling. Endo:  Denies cold intolerance, excessive thirst, excessive urination, and heat intolerance. Heme:  Denies abnormal bruising and bleeding.  Physical Exam  General:  elderly male in no distress  Head:  normocephalic, atraumatic, and no abnormalities observed.   Eyes:  vision grossly intact, pupils  equal, pupils round, and pupils reactive to light.  no conjunctival pallor, injection or icterus  Mouth:  pharynx pink and moist.   Neck:  supple with full rom and no masses or thyromegally, no JVD or carotid bruit  Lungs:  Normal respiratory effort, chest expands symmetrically. Lungs are clear to auscultation, no crackles or wheezes. Heart:  Normal rate and regular rhythm. S1 and S2 normal without gallop, murmur, click, rub or other extra sounds. Abdomen:  Bowel sounds positive,abdomen soft and non-tender without  masses, organomegaly or hernias noted. no renal bruits  Pulses:  R and L carotid,radial,femoral,dorsalis pedis and posterior tibial pulses are full and equal bilaterally Extremities:  trace pedal edema bilat   Neurologic:  sensation intact to light touch, gait normal, and DTRs symmetrical and normal.   Skin:  Intact without suspicious lesions or rashes Cervical Nodes:  No lymphadenopathy noted Inguinal Nodes:  No significant adenopathy Psych:  normal affect, talkative and pleasant    Impression & Recommendations:  Problem # 1:  CONSTIPATION, CHRONIC (ICD-564.09) Assessment Deteriorated this is worsening with time - dependent on laxatives , and getting a bit of nausea even with small meals  disc need for fiber and fluids - doing well with this  last colonosc was 2002 ref to GI for eval  pt will use mag citrate today as this works well His updated medication list for this problem includes:    Miralax Powd (Polyethylene glycol 3350) ..... Otc as directed.  Orders: Gastroenterology Referral (GI)  Complete Medication List: 1)  Lipitor 40 Mg Tabs (Atorvastatin calcium) .... Take one by mouth daily 2)  Adult Aspirin Low Strength 81 Mg Tbdp (Aspirin) .... One by mouth daily 3)  Fish Oil 1200 Mg Caps (Omega-3 fatty acids) .... One by mouth daily 4)  Lasix 20 Mg Tabs (Furosemide) .Marland Kitchen.. 1 by mouth once daily as needed swelling 5)  Vitamin D 1000 Iu  .Marland Kitchen.. 1 by mouth once daily 6)  Colace  .Marland KitchenMarland Kitchen. 1 by mouth  every ha as needed 7)  Norvasc 5 Mg Tabs (Amlodipine besylate) .Marland Kitchen.. 1 by mouth once daily in ams 8)  Miralax Powd (Polyethylene glycol 3350) .... Otc as directed. 9)  Otc Suppository To Help Constipation Pt Not Sure of Name  .... Otc as directed.  Patient Instructions: 1)  take the magnesium citrate today 2)  continue miralax and high fiber diet with lots of fluids (colace is ok too)  3)  we will do referral to GI at check out   Current Allergies (reviewed today): ! PENICILLIN !  CODEINE ! IODINE ! * CHLORTHALIDONE * ALTACE * REQUIP

## 2010-07-11 ENCOUNTER — Encounter: Payer: Self-pay | Admitting: *Deleted

## 2010-07-11 ENCOUNTER — Other Ambulatory Visit: Payer: Self-pay | Admitting: Family Medicine

## 2010-07-11 ENCOUNTER — Other Ambulatory Visit (INDEPENDENT_AMBULATORY_CARE_PROVIDER_SITE_OTHER): Payer: Medicare Other

## 2010-07-11 DIAGNOSIS — I10 Essential (primary) hypertension: Secondary | ICD-10-CM

## 2010-07-11 DIAGNOSIS — Z125 Encounter for screening for malignant neoplasm of prostate: Secondary | ICD-10-CM

## 2010-07-11 DIAGNOSIS — E7800 Pure hypercholesterolemia, unspecified: Secondary | ICD-10-CM

## 2010-07-11 DIAGNOSIS — N259 Disorder resulting from impaired renal tubular function, unspecified: Secondary | ICD-10-CM

## 2010-07-11 LAB — LIPID PANEL
Cholesterol: 173 mg/dL (ref 0–200)
HDL: 43.6 mg/dL (ref >39.00)
LDL Cholesterol: 115 mg/dL — ABNORMAL HIGH (ref 0–99)
Total CHOL/HDL Ratio: 4
Triglycerides: 73 mg/dL (ref 0.0–149.0)
VLDL: 14.6 mg/dL (ref 0.0–40.0)

## 2010-07-11 LAB — RENAL FUNCTION PANEL
Albumin: 3.8 g/dL (ref 3.5–5.2)
BUN: 20 mg/dL (ref 6–23)
CO2: 26 mEq/L (ref 19–32)
Calcium: 9.1 mg/dL (ref 8.4–10.5)
Chloride: 104 mEq/L (ref 96–112)
Creatinine, Ser: 1.4 mg/dL (ref 0.4–1.5)
GFR: 52.25 mL/min — ABNORMAL LOW (ref >60.00)
Glucose, Bld: 93 mg/dL (ref 70–99)
Phosphorus: 3.3 mg/dL (ref 2.3–4.6)
Potassium: 4.5 mEq/L (ref 3.5–5.1)
Sodium: 138 mEq/L (ref 135–145)

## 2010-07-11 LAB — PSA: PSA: 1.6 ng/mL (ref 0.10–4.00)

## 2010-07-11 LAB — ALT: ALT: 33 U/L (ref 0–53)

## 2010-07-11 LAB — AST: AST: 26 U/L (ref 0–37)

## 2010-07-13 ENCOUNTER — Encounter: Payer: Self-pay | Admitting: Family Medicine

## 2010-07-13 ENCOUNTER — Ambulatory Visit (INDEPENDENT_AMBULATORY_CARE_PROVIDER_SITE_OTHER): Payer: Medicare Other | Admitting: Family Medicine

## 2010-07-13 DIAGNOSIS — K5909 Other constipation: Secondary | ICD-10-CM

## 2010-07-13 DIAGNOSIS — I10 Essential (primary) hypertension: Secondary | ICD-10-CM

## 2010-07-13 DIAGNOSIS — R609 Edema, unspecified: Secondary | ICD-10-CM

## 2010-07-13 DIAGNOSIS — N259 Disorder resulting from impaired renal tubular function, unspecified: Secondary | ICD-10-CM

## 2010-07-28 NOTE — Assessment & Plan Note (Signed)
Summary: ROA for 6 month follow u p//jrr r/s 07/18/10   Vital Signs:  Patient profile:   77 year old male Height:      66.75 inches Weight:      166.50 pounds BMI:     26.37 Temp:     97.9 degrees F oral Pulse rate:   76 / minute Pulse rhythm:   regular BP sitting:   130 / 76  (left arm) Cuff size:   regular  Vitals Entered By: Lewanda Rife LPN (July 13, 2010 8:33 AM) CC: six month f/u after labs   History of Present Illness: here for f/u of HTN and lipids and renal insuff  has been working in the yard   wt is up 5 lb with bmi of 26 ate too many sweets this winter   HTN 130/76 some at home go up to 140-150 systolic   lipids up just a bit with LDL 115 but better HDL Last Lipid ProfileCholesterol: 173 (07/11/2010 8:45:18 AM)HDL:  43.60 (07/11/2010 8:45:18 AM)LDL:  115 (07/11/2010 8:45:18 AM)Triglycerides:  Last Liver profileSGOT:  26 (07/11/2010 8:45:18 AM)SPGT:  33 (07/11/2010 8:45:18 AM)T. Bili:  0.8 (02/24/2008 11:06:00 AM)Alk Phos:  76 (02/24/2008 11:06:00 AM)   cr is 1.4 up from 1.2  has not been drinking enough water  sees renal   psa is 1.6- stable  chlorthalidone was added and caused too much constipation - had to stop it  has not been taking lasix due to frequent urination  colonosc ok - redundant colon  miralax is helping daily   Allergies: 1)  ! Penicillin 2)  ! Codeine 3)  ! Iodine 4)  ! * Chlorthalidone 5)  * Altace 6)  * Requip  Past History:  Past Medical History: Last updated: 03/27/2010 Allergic rhinitis Blood transfusion Skin cancer, hx of- melanoma by right eye Hypertension Renal insufficiency chronic kidney diseas stage 3 pulmonary nodules (being obs with serial CT) chronic constipation  renal Dr Dehmer-- Lucienne Minks  GI- Dr Marva Panda  Past Surgical History: Last updated: 03/27/2010 EGD, Colonoscopy- neg. 3/02 nl colonoscopy Ganglion cyst removal- hands 2D Echo- mild LVH (05/2002) Pulmonary nodules on CT (07/2003) 6 month follow-up  CT- stable pulmonary nodules (01/2004) Stress Myoview- neg. (01/2005) Renal US- neg., benign app. cysts (05/2006) CT chest with calcification consistant with asbestos exp, also pleural nodule(6/09)- rec re check 6 mo, incidental gallstones also seen on exam 2D echo 6/09 slt thickening of aortic valve with EF of 60 % CT of head- small vessel isch change, some sinus polyps 2011 redundant colon on colonoscopy  Family History: Last updated: 05/28/2007 father HTN, CVA mother HTN brother with pancreatic ca  Social History: Last updated: 10/04/2007 quit smoking in 1957 Patient is a former smoker. --quit in 1950s plays music with gospel/bluegrass group  Risk Factors: Smoking Status: quit (08/10/2006)  Review of Systems General:  Denies fatigue, loss of appetite, and malaise. Eyes:  Denies blurring and eye irritation. CV:  Denies chest pain or discomfort, lightheadness, and palpitations. Resp:  Denies cough, shortness of breath, and wheezing. GI:  Denies abdominal pain, change in bowel habits, and indigestion. GU:  Complains of nocturia and urinary frequency. Derm:  Denies lesion(s), poor wound healing, and rash. Neuro:  Denies numbness and tingling. Psych:  Denies anxiety. Endo:  Denies cold intolerance and excessive hunger. Heme:  Denies abnormal bruising and bleeding.  Physical Exam  General:  Well-developed,well-nourished,in no acute distress; alert,appropriate and cooperative throughout examination Head:  normocephalic, atraumatic, and no abnormalities observed.  Eyes:  vision grossly intact, pupils equal, pupils round, and pupils reactive to light.  no conjunctival pallor, injection or icterus  Nose:  no nasal discharge.   Mouth:  pharynx pink and moist.   Neck:  supple with full rom and no masses or thyromegally, no JVD or carotid bruit  Chest Wall:  No deformities, masses, tenderness or gynecomastia noted. Lungs:  Normal respiratory effort, chest expands symmetrically.  Lungs are clear to auscultation, no crackles or wheezes. Heart:  Normal rate and regular rhythm. S1 and S2 normal without gallop, murmur, click, rub or other extra sounds. Abdomen:  Bowel sounds positive,abdomen soft and non-tender without masses, organomegaly or hernias noted. no renal bruits  Msk:  No deformity or scoliosis noted of thoracic or lumbar spine.   Pulses:  R and L carotid,radial,femoral,dorsalis pedis and posterior tibial pulses are full and equal bilaterally Extremities:  trace pedal edema bilat   Neurologic:  sensation intact to light touch, gait normal, and DTRs symmetrical and normal.   Skin:  Intact without suspicious lesions or rashes Cervical Nodes:  No lymphadenopathy noted Inguinal Nodes:  No significant adenopathy Psych:  normal affect, talkative and pleasant    Impression & Recommendations:  Problem # 1:  CONSTIPATION, CHRONIC (ICD-564.09) Assessment Improved better with miralax daily rev colonosc His updated medication list for this problem includes:    Miralax Powd (Polyethylene glycol 3350) ..... Otc as directed.  Problem # 2:  SPECIAL SCREENING MALIGNANT NEOPLASM OF PROSTATE (ICD-V76.44) Assessment: Comment Only psa stable- no problems   Problem # 3:  Hx of HYPERCHOLESTEROLEMIA (ICD-272.0) Assessment: Unchanged  good control with lipitor and diet rev labs with pt  rev low sat fat diet  His updated medication list for this problem includes:    Lipitor 40 Mg Tabs (Atorvastatin calcium) .Marland Kitchen... Take one by mouth daily  Labs Reviewed: SGOT: 26 (07/11/2010)   SGPT: 33 (07/11/2010)   HDL:43.60 (07/11/2010), 39.70 (01/06/2010)  LDL:115 (07/11/2010), 103 (01/06/2010)  Chol:173 (07/11/2010), 152 (01/06/2010)  Trig:73.0 (07/11/2010), 49.0 (01/06/2010)  Problem # 4:  RENAL INSUFFICIENCY (ICD-588.9) Assessment: Deteriorated a bit worse- poss from inc bp  will inc norvasc to 10- see how he tolerates that and f/u for labs and visit if not changed will f/u  with renal   Problem # 5:  HYPERTENSION (ICD-401.9) Assessment: Deteriorated  not optimal lately inc norvasc to 10 with f/u in 6-8 weeks disc poss side eff ie: swelling His updated medication list for this problem includes:    Lasix 20 Mg Tabs (Furosemide) .Marland Kitchen... 1 by mouth once daily as needed swelling    Norvasc 10 Mg Tabs (Amlodipine besylate) .Marland Kitchen... 1 by mouth once daily  BP today: 130/76 Prior BP: 140/82 (01/24/2010)  Labs Reviewed: K+: 4.5 (07/11/2010) Creat: : 1.4 (07/11/2010)   Chol: 173 (07/11/2010)   HDL: 43.60 (07/11/2010)   LDL: 115 (07/11/2010)   TG: 73.0 (07/11/2010)  Orders: Prescription Created Electronically [phone])  Complete Medication List: 1)  Lipitor 40 Mg Tabs (Atorvastatin calcium) .... Take one by mouth daily 2)  Adult Aspirin Low Strength 81 Mg Tbdp (Aspirin) .... One by mouth daily 3)  Fish Oil 1200 Mg Caps (Omega-3 fatty acids) .... One by mouth daily 4)  Lasix 20 Mg Tabs (Furosemide) .Marland Kitchen.. 1 by mouth once daily as needed swelling 5)  Vitamin D 1000 Iu  .Marland Kitchen.. 1 by mouth once daily 6)  Norvasc 10 Mg Tabs (Amlodipine besylate) .Marland Kitchen.. 1 by mouth once daily 7)  Miralax Powd (Polyethylene glycol 3350) .Marland KitchenMarland KitchenMarland Kitchen  Otc as directed. 8)  Otc Suppository To Help Constipation Pt Not Sure of Name  .... Otc as directed.  Patient Instructions: 1)  increase your norvasc to 10 mg once daily  2)  take lasix when  needed  3)  if you have bad leg swelling - update me  4)  keep watching bp  5)  follow up with me in 6-8 weeks for visit and labs  6)  other labs look good  7)  watch diet  8)  stay active  Prescriptions: NORVASC 10 MG TABS (AMLODIPINE BESYLATE) 1 by mouth once daily  #30 x 11   Entered and Authorized by:   Judith Part MD   Signed by:   Judith Part MD on 07/13/2010   Method used:   Electronically to        [id number] ([facility]ail)       [address]       [place], [place]  [address]       Ph: [phone] or [phone]       Fax:  [phone]   RxID:   [id number]    Orders Added: 1)  Prescription Created Electronically [G8553] 2)  Est. Patient Level IV [10175]    Current Allergies (reviewed today): ! PENICILLIN ! CODEINE ! IODINE ! * CHLORTHALIDONE * ALTACE * REQUIP

## 2010-09-03 ENCOUNTER — Encounter: Payer: Self-pay | Admitting: Family Medicine

## 2010-09-06 ENCOUNTER — Encounter: Payer: Self-pay | Admitting: Family Medicine

## 2010-09-06 ENCOUNTER — Ambulatory Visit (INDEPENDENT_AMBULATORY_CARE_PROVIDER_SITE_OTHER): Payer: Medicare Other | Admitting: Family Medicine

## 2010-09-06 DIAGNOSIS — N259 Disorder resulting from impaired renal tubular function, unspecified: Secondary | ICD-10-CM

## 2010-09-06 DIAGNOSIS — I10 Essential (primary) hypertension: Secondary | ICD-10-CM

## 2010-09-06 NOTE — Assessment & Plan Note (Signed)
Goal to keep bp in good control  This has improved  Labs -renal panel and f/u 3 mo  Does not currently have renal doctor f/u  Will make plan then  Enc good water intake as usual

## 2010-09-06 NOTE — Patient Instructions (Signed)
Your blood pressure is better  Continue your current medications Keep drinking your water  Use lasix when you need it  Schedule labs and then follow up in 3 months

## 2010-09-06 NOTE — Assessment & Plan Note (Signed)
Better control with inc in amlodipine and tolerated well  Disc low sodium diet Use lasix when needed Lab and f/u 3 mo

## 2010-09-06 NOTE — Progress Notes (Signed)
Subjective:    Patient ID: Matthew [name], male    DOB: 1924-10-04, 77 y.o.   MRN: [id number]  HPI Here for f/u of HTN -- is feeling about the same as before  Not optimal at last visit and inc his amlodipine to 10 mg daily  Home bp are much better -- usually 130 or below over 70s-80s  Is a little higher here   Edema -- not bad at all - only had to take lasix twice  A little swollen today   First bp today is 138/80 Feels good  Staying active   Wt is stable   Hx of renal insuff last cr 1.4 Does not have a renal follow up - will need labs  Does work on keeping up a good water intake   Past Medical History  Diagnosis Date  . Allergic rhinitis   . History of transfusion of whole blood   . Melanoma     history of, by right eye  . Hypertension   . Renal insufficiency   . Chronic kidney disease     stage 3, [place] benign appearing cysts 05/2006  . Pulmonary nodules     being obs with serial CT, 6 month f/u stable 10/05  . Chronic constipation   . History of CT scan of chest 6/09    calcification consistent with asbestos exp, also pleural nodule, incidental gallstones    History   Social History  . Marital Status: Married    Spouse Name: N/A    Number of Children: N/A  . Years of Education: N/A   Occupational History  . Not on file.   Social History Main Topics  . Smoking status: Former Games developer  . Smokeless tobacco: Not on file   Comment: Quit smoking in 1957.  Marland Kitchen Alcohol Use: Not on file  . Drug Use: Not on file  . Sexually Active: Not on file   Other Topics Concern  . Not on file   Social History Narrative   Plays music with gospel/ bluegrass group          Review of Systems Review of Systems  Constitutional: Negative for fever, appetite change, fatigue and unexpected weight change.  Eyes: Negative for pain and visual disturbance.  Respiratory: Negative for cough and shortness of breath.   Cardiovascular: Negative for cp and sob, pos for mild edema     Gastrointestinal: Negative for nausea, diarrhea and constipation.  Genitourinary: Negative for urgency and frequency.  Skin: Negative for pallor.  Neurological: Negative for weakness, light-headedness, numbness and headaches.  Hematological: Negative for adenopathy. Does not bruise/bleed easily.  Psychiatric/Behavioral: Negative for dysphoric mood. The patient is not nervous/anxious.          Objective:   Physical Exam  Constitutional: He appears well-developed and well-nourished. No distress.  HENT:  Head: Normocephalic and atraumatic.  Eyes: Conjunctivae and EOM are normal. Pupils are equal, round, and reactive to light.  Neck: Normal range of motion. Neck supple. No JVD present. Carotid bruit is not present. No thyromegaly present.  Cardiovascular: Normal rate, regular rhythm and normal heart sounds.   No murmur heard. Pulmonary/Chest: Effort normal and breath sounds normal. No respiratory distress. He has no wheezes. He has no rales.  Abdominal: Soft. Bowel sounds are normal. He exhibits no abdominal bruit. There is no tenderness.  Musculoskeletal: He exhibits no edema and no tenderness.  Lymphadenopathy:    He has no cervical adenopathy.  Neurological: He is alert. He has normal reflexes.  No cranial nerve deficit. Coordination normal.  Skin: Skin is warm and dry.  Psychiatric: He has a normal mood and affect.          Assessment & Plan:

## 2010-12-05 ENCOUNTER — Other Ambulatory Visit (INDEPENDENT_AMBULATORY_CARE_PROVIDER_SITE_OTHER): Payer: Medicare Other | Admitting: Family Medicine

## 2010-12-05 DIAGNOSIS — N259 Disorder resulting from impaired renal tubular function, unspecified: Secondary | ICD-10-CM

## 2010-12-05 LAB — RENAL FUNCTION PANEL
Albumin: 4.1 g/dL (ref 3.5–5.2)
BUN: 25 mg/dL — ABNORMAL HIGH (ref 6–23)
CO2: 25 mEq/L (ref 19–32)
Calcium: 9.2 mg/dL (ref 8.4–10.5)
Chloride: 103 mEq/L (ref 96–112)
Creatinine, Ser: 1.5 mg/dL (ref 0.4–1.5)
GFR: 48.53 mL/min — ABNORMAL LOW (ref >60.00)
Glucose, Bld: 90 mg/dL (ref 70–99)
Phosphorus: 3.1 mg/dL (ref 2.3–4.6)
Potassium: 4.4 mEq/L (ref 3.5–5.1)
Sodium: 138 mEq/L (ref 135–145)

## 2010-12-07 ENCOUNTER — Ambulatory Visit (INDEPENDENT_AMBULATORY_CARE_PROVIDER_SITE_OTHER): Payer: Medicare Other | Admitting: Family Medicine

## 2010-12-07 ENCOUNTER — Encounter: Payer: Self-pay | Admitting: Family Medicine

## 2010-12-07 DIAGNOSIS — E7800 Pure hypercholesterolemia, unspecified: Secondary | ICD-10-CM

## 2010-12-07 DIAGNOSIS — I10 Essential (primary) hypertension: Secondary | ICD-10-CM

## 2010-12-07 DIAGNOSIS — N259 Disorder resulting from impaired renal tubular function, unspecified: Secondary | ICD-10-CM

## 2010-12-07 MED ORDER — ATORVASTATIN CALCIUM 40 MG PO TABS: 40.0000 mg | ORAL_TABLET | Freq: Every day | ORAL | Status: DC

## 2010-12-07 NOTE — Assessment & Plan Note (Signed)
Has been in good control with statin Refilled today Check at next visit 6 mo

## 2010-12-07 NOTE — Progress Notes (Signed)
Subjective:    Patient ID: Matthew [name], male    DOB: 18-Apr-1925, 77 y.o.   MRN: [id number]  HPI Here for f/u of HTN and renal insuff  HTN is in very good control  110/76 No ha or palpitations  Edema - is doing good even with swelling  Lasix is really helping   In general has very little energy  Gets up with a little vertigo- and it goes away quickly (worse in summer )   Wt is down 7 lb Is working outside and cutting weeds  Renal panel - cr is 1.5 and bun 25 Admittedly does not drink enough water  This cr is up from 1.4  Has seen renal in past - thought to have HTN rel kidney dz  Lab Results  Component Value Date   CHOL 173 07/11/2010   CHOL 152 01/06/2010   CHOL 156 02/01/2009   Lab Results  Component Value Date   HDL 43.60 07/11/2010   HDL 96.29 01/06/2010   HDL 36.20* 02/01/2009   Lab Results  Component Value Date   LDLCALC 115* 07/11/2010   LDLCALC 103* 01/06/2010   LDLCALC 103* 02/01/2009   Lab Results  Component Value Date   TRIG 73.0 07/11/2010   TRIG 49.0 01/06/2010   TRIG 86.0 02/01/2009   Lab Results  Component Value Date   CHOLHDL 4 07/11/2010   CHOLHDL 4 01/06/2010   CHOLHDL 4 02/01/2009   No results found for this basename: LDLDIRECT   ok in march  Patient Active Problem List  Diagnoses  . HYPERCHOLESTEROLEMIA  . RESTLESS LEG SYNDROME  . ALLERGIC CONJUNCTIVITIS  . HYPERTENSION  . ALLERGIC RHINITIS  . CONSTIPATION, CHRONIC  . RENAL INSUFFICIENCY  . VERTIGO  . EDEMA  . Nonspecific (abnormal) findings on radiological and other examination of body structure  . SKIN CANCER, HX OF  . ABNORMAL CHEST XRAY   Past Medical History  Diagnosis Date  . Allergic rhinitis   . History of transfusion of whole blood   . Melanoma     history of, by right eye  . Hypertension   . Renal insufficiency   . Chronic kidney disease     stage 3, [place] benign appearing cysts 05/2006  . Pulmonary nodules     being obs with serial CT, 6 month f/u stable 10/05  . Chronic  constipation   . History of CT scan of chest 6/09    calcification consistent with asbestos exp, also pleural nodule, incidental gallstones   Past Surgical History  Procedure Date  . Ganglion cyst excision     hands  . 2d echo 6/09    slight thickening of aortic valve with EF of 60%   History  Substance Use Topics  . Smoking status: Former Games developer  . Smokeless tobacco: Not on file   Comment: Quit smoking in 1957.  Marland Kitchen Alcohol Use: Not on file   Family History  Problem Relation Age of Onset  . Hypertension Mother   . Hypertension Father   . Stroke Father   . Cancer Brother     pancreatic   Allergies  Allergen Reactions  . Chlorthalidone     REACTION: frequent urination  . Codeine     REACTION: nausea  . Iodine     REACTION: caused bubble to come up in eye  . Penicillins     REACTION: GI  . Ramipril     REACTION: cough  . Ropinirole Hydrochloride     REACTION: insomnia  Current Outpatient Prescriptions on File Prior to Visit  Medication Sig Dispense Refill  . amLODipine (NORVASC) 10 MG tablet Take 10 mg by mouth daily.        Marland Kitchen aspirin 81 MG tablet Take 81 mg by mouth daily.        . Cholecalciferol (VITAMIN D) 1000 UNITS capsule Take 1,000 Units by mouth daily.        . Omega-3 Fatty Acids (FISH OIL) 1200 MG CAPS Take by mouth daily.        . polyethylene glycol powder (MIRALAX) powder Take otc as directed       . furosemide (LASIX) 20 MG tablet Take one by mouth once daily as needed for swelling       . Glycerin, Laxative, (GLYCERIN ADULT) 3 G SUPP Place rectally. OTC as directed           Review of Systems Review of Systems  Constitutional: Negative for fever, appetite change, fatigue and unexpected weight change.  Eyes: Negative for pain and visual disturbance.  Respiratory: Negative for cough and shortness of breath.   Cardiovascular: Negative. For cp or palpitations or edema   Gastrointestinal: Negative for nausea, diarrhea and constipation.    Genitourinary: Negative for urgency and frequency.  Skin: Negative for pallor. or rash  Neurological: Negative for weakness, light-headedness, numbness and headaches.  Hematological: Negative for adenopathy. Does not bruise/bleed easily.  Psychiatric/Behavioral: Negative for dysphoric mood. The patient is not nervous/anxious.         Objective:   Physical Exam  Constitutional: He appears well-developed and well-nourished. No distress.  HENT:  Head: Normocephalic and atraumatic.  Mouth/Throat: Oropharynx is clear and moist.  Eyes: Conjunctivae and EOM are normal. Pupils are equal, round, and reactive to light.  Neck: Normal range of motion. Neck supple. Carotid bruit is not present. No tracheal deviation present. No thyromegaly present.  Cardiovascular: Normal rate, regular rhythm, normal heart sounds and intact distal pulses.   Pulmonary/Chest: Effort normal and breath sounds normal. No respiratory distress. He has no wheezes.  Abdominal: Soft. Bowel sounds are normal. He exhibits no distension and no abdominal bruit. There is no tenderness.  Musculoskeletal: Normal range of motion. He exhibits no edema and no tenderness.  Lymphadenopathy:    He has no cervical adenopathy.  Neurological: He is alert. He has normal reflexes. Coordination normal.  Skin: Skin is warm and dry. No rash noted. No erythema.  Psychiatric: He has a normal mood and affect.          Assessment & Plan:

## 2010-12-07 NOTE — Patient Instructions (Signed)
Keep up the good work with good health habits  bp is good  We will do referral to renal ( kidney doctor ) at check out  (they will need last notes)  Drink more water  Follow up with me in about 6 months with labs prior

## 2010-12-07 NOTE — Assessment & Plan Note (Signed)
In very good control with norvasc and lasix Edema well controlled  Cr is up - will check in with renal  Adv to drink more water

## 2010-12-07 NOTE — Assessment & Plan Note (Signed)
Cr is up to 1.5 Adv to drink more water Has hypertensive renal dz Ref back to renal to check in

## 2011-01-17 ENCOUNTER — Other Ambulatory Visit: Payer: Self-pay | Admitting: *Deleted

## 2011-01-17 MED ORDER — ATORVASTATIN CALCIUM 40 MG PO TABS: 40.0000 mg | ORAL_TABLET | Freq: Every day | ORAL | Status: DC

## 2011-02-06 ENCOUNTER — Ambulatory Visit: Payer: Medicare Other | Admitting: Internal Medicine

## 2011-02-07 ENCOUNTER — Encounter: Payer: Self-pay | Admitting: Family Medicine

## 2011-02-07 ENCOUNTER — Ambulatory Visit (INDEPENDENT_AMBULATORY_CARE_PROVIDER_SITE_OTHER): Payer: Medicare Other | Admitting: Family Medicine

## 2011-02-07 VITALS — BP 122/70 | HR 64 | Temp 97.8°F | Ht 66.75 in | Wt 160.0 lb

## 2011-02-07 DIAGNOSIS — J069 Acute upper respiratory infection, unspecified: Secondary | ICD-10-CM

## 2011-02-07 MED ORDER — BENZONATATE 100 MG PO CAPS: 100.0000 mg | ORAL_CAPSULE | Freq: Four times a day (QID) | ORAL | Status: AC | PRN

## 2011-02-07 MED ORDER — AZITHROMYCIN 250 MG PO TABS: ORAL_TABLET | ORAL | Status: AC

## 2011-02-07 NOTE — Progress Notes (Signed)
SUBJECTIVE:  Matthew [name] is a 77 y.o. male who complains of coryza, dry cough, myalgias and fever for 5 days. He denies a history of nausea, shortness of breath and vomiting and denies a history of asthma.    OBJECTIVE: BP 122/70  Pulse 64  Temp(Src) 97.8 F (36.6 C) (Oral)  Ht 5' 6.75" (1.695 m)  Wt 160 lb (72.576 kg)  BMI 25.25 kg/m2  He appears well, vital signs are as noted. Ears normal.  Throat and pharynx normal.  Neck supple. No adenopathy in the neck. Nose is congested. Sinuses non tender. The chest is clear, without wheezes or rales although slightly decreased BS at bases-?baseline.  Patient Active Problem List  Diagnoses  . HYPERCHOLESTEROLEMIA  . RESTLESS LEG SYNDROME  . ALLERGIC CONJUNCTIVITIS  . HYPERTENSION  . ALLERGIC RHINITIS  . CONSTIPATION, CHRONIC  . RENAL INSUFFICIENCY  . VERTIGO  . EDEMA  . Nonspecific (abnormal) findings on radiological and other examination of body structure  . SKIN CANCER, HX OF  . ABNORMAL CHEST XRAY  . URI (upper respiratory infection)   Past Medical History  Diagnosis Date  . Allergic rhinitis   . History of transfusion of whole blood   . Melanoma     history of, by right eye  . Hypertension   . Renal insufficiency   . Chronic kidney disease     stage 3, [place] benign appearing cysts 05/2006  . Pulmonary nodules     being obs with serial CT, 6 month f/u stable 10/05  . Chronic constipation   . History of CT scan of chest 6/09    calcification consistent with asbestos exp, also pleural nodule, incidental gallstones   Past Surgical History  Procedure Date  . Ganglion cyst excision     hands  . 2d echo 6/09    slight thickening of aortic valve with EF of 60%   History  Substance Use Topics  . Smoking status: Former Games developer  . Smokeless tobacco: Not on file   Comment: Quit smoking in 1957.  Marland Kitchen Alcohol Use: Not on file   Family History  Problem Relation Age of Onset  . Hypertension Mother   . Hypertension Father   .  Stroke Father   . Cancer Brother     pancreatic   Allergies  Allergen Reactions  . Chlorthalidone     REACTION: frequent urination  . Codeine     REACTION: nausea  . Iodine     REACTION: caused bubble to come up in eye  . Penicillins     REACTION: GI  . Ramipril     REACTION: cough  . Ropinirole Hydrochloride     REACTION: insomnia   Current Outpatient Prescriptions on File Prior to Visit  Medication Sig Dispense Refill  . amLODipine (NORVASC) 10 MG tablet Take 10 mg by mouth daily.        Marland Kitchen aspirin 81 MG tablet Take 81 mg by mouth daily.        Marland Kitchen atorvastatin (LIPITOR) 40 MG tablet Take 1 tablet (40 mg total) by mouth daily.  30 tablet  11  . Cholecalciferol (VITAMIN D) 1000 UNITS capsule Take 1,000 Units by mouth daily.        . furosemide (LASIX) 20 MG tablet Take one by mouth once daily as needed for swelling       . Omega-3 Fatty Acids (FISH OIL) 1200 MG CAPS Take by mouth daily.        . polyethylene glycol  powder (MIRALAX) powder Take otc as directed       . Glycerin, Laxative, (GLYCERIN ADULT) 3 G SUPP Place rectally. OTC as directed         ASSESSMENT:  viral upper respiratory illness and bronchitis  PLAN: Given that pt has been febrile at home for several days and complaining of fatigue with decreased breath sounds at bases, will treat for possible bronchitis with Zpack (PCN allergic).  Symptomatic therapy suggested: push fluids, rest and return office visit prn if symptoms persist or worsen. Call or return to clinic prn if these symptoms worsen or fail to improve as anticipated. The patient indicates understanding of these issues and agrees with the plan.

## 2011-02-07 NOTE — Patient Instructions (Signed)
Good to see you. Take antibiotic as directed.  Drink lots of fluids.  Treat sympotmatically with Mucinex, nasal saline irrigation, and Tylenol/Ibuprofen.Cough suppressant at night. Call if not improving as expected in 5-7 days.    

## 2011-02-13 ENCOUNTER — Telehealth: Payer: Self-pay | Admitting: *Deleted

## 2011-02-13 NOTE — Telephone Encounter (Signed)
Thanks keep me updated

## 2011-02-13 NOTE — Telephone Encounter (Signed)
I agree with adv not to take more abx -unless fever or other symptoms (let me know) Not unusual for cough to linger  I know he cannot take codiene- please ask if he can take hydrocodone -- in terms of px cough med

## 2011-02-13 NOTE — Telephone Encounter (Signed)
Pt was seen last week by Dr. Dayton Martes and given a z-pack.  He finished this on Saturday and still has a cough.  He is asking if he can get a refill on the it.  He doesn't have any other symptoms.  I advised him that the cough can linger for several weeks and if he doesn't have any other symptoms he can probably do without more antibiotic.  He said the tesselon he was given isnt helping at all.  Please advise.  [facility] [place]  I told him that Dr. Dayton Martes is out of the office today, so it may be Tuesday before he hears back from [place].

## 2011-02-13 NOTE — Telephone Encounter (Signed)
Patient notified as instructed by telephone. Pt said he is not sure if he can take hydrocodone or not. Pt said this afternoon the cough is much better and is not bothering him much at all now. Pt said he would call back if further symptoms or cough get worse.

## 2011-06-04 ENCOUNTER — Telehealth: Payer: Self-pay | Admitting: Family Medicine

## 2011-06-04 DIAGNOSIS — Z125 Encounter for screening for malignant neoplasm of prostate: Secondary | ICD-10-CM

## 2011-06-04 DIAGNOSIS — I10 Essential (primary) hypertension: Secondary | ICD-10-CM

## 2011-06-04 DIAGNOSIS — E7800 Pure hypercholesterolemia, unspecified: Secondary | ICD-10-CM

## 2011-06-04 DIAGNOSIS — R609 Edema, unspecified: Secondary | ICD-10-CM

## 2011-06-04 DIAGNOSIS — N259 Disorder resulting from impaired renal tubular function, unspecified: Secondary | ICD-10-CM

## 2011-06-04 DIAGNOSIS — G2581 Restless legs syndrome: Secondary | ICD-10-CM

## 2011-06-04 NOTE — Telephone Encounter (Signed)
Message copied by Judy Pimple on Sun Jun 04, 2011  2:04 PM ------      Message from: Baldomero Lamy      Created: Wed May 24, 2011 11:38 AM      Regarding: F/U labs Mon 2/4       Please order  future f/u labs for pt's upcomming lab appt.      Thanks      Rodney Booze

## 2011-06-05 ENCOUNTER — Other Ambulatory Visit (INDEPENDENT_AMBULATORY_CARE_PROVIDER_SITE_OTHER): Payer: Medicare Other

## 2011-06-05 DIAGNOSIS — R609 Edema, unspecified: Secondary | ICD-10-CM

## 2011-06-05 DIAGNOSIS — E7800 Pure hypercholesterolemia, unspecified: Secondary | ICD-10-CM

## 2011-06-05 DIAGNOSIS — Z125 Encounter for screening for malignant neoplasm of prostate: Secondary | ICD-10-CM

## 2011-06-05 DIAGNOSIS — N259 Disorder resulting from impaired renal tubular function, unspecified: Secondary | ICD-10-CM

## 2011-06-05 DIAGNOSIS — I10 Essential (primary) hypertension: Secondary | ICD-10-CM

## 2011-06-05 DIAGNOSIS — G2581 Restless legs syndrome: Secondary | ICD-10-CM

## 2011-06-05 LAB — CBC WITH DIFFERENTIAL/PLATELET
Basophils Absolute: 0 10*3/uL (ref 0.0–0.1)
Eosinophils Relative: 4.5 % (ref 0.0–5.0)
Lymphocytes Relative: 27.6 % (ref 12.0–46.0)
Monocytes Relative: 6.8 % (ref 3.0–12.0)
Platelets: 194 10*3/uL (ref 150.0–400.0)
RDW: 13 % (ref 11.5–14.6)
WBC: 8.5 10*3/uL (ref 4.5–10.5)

## 2011-06-05 LAB — RENAL FUNCTION PANEL
Albumin: 3.7 g/dL (ref 3.5–5.2)
GFR: 50.04 mL/min — ABNORMAL LOW (ref >60.00)
Glucose, Bld: 91 mg/dL (ref 70–99)
Phosphorus: 3.5 mg/dL (ref 2.3–4.6)
Potassium: 4.3 mEq/L (ref 3.5–5.1)
Sodium: 141 mEq/L (ref 135–145)

## 2011-06-05 LAB — LIPID PANEL
Cholesterol: 145 mg/dL (ref 0–200)
HDL: 38 mg/dL — ABNORMAL LOW (ref >39.00)
LDL Cholesterol: 92 mg/dL (ref 0–99)
Triglycerides: 73 mg/dL (ref 0.0–149.0)
VLDL: 14.6 mg/dL (ref 0.0–40.0)

## 2011-06-05 LAB — HEPATIC FUNCTION PANEL
ALT: 18 U/L (ref 0–53)
Albumin: 3.7 g/dL (ref 3.5–5.2)
Bilirubin, Direct: 0.2 mg/dL (ref 0.0–0.3)
Total Protein: 6.5 g/dL (ref 6.0–8.3)

## 2011-06-05 LAB — PSA: PSA: 1.58 ng/mL (ref 0.10–4.00)

## 2011-06-12 ENCOUNTER — Ambulatory Visit: Payer: Medicare Other | Admitting: Family Medicine

## 2011-06-13 ENCOUNTER — Encounter: Payer: Self-pay | Admitting: Family Medicine

## 2011-06-13 ENCOUNTER — Ambulatory Visit (INDEPENDENT_AMBULATORY_CARE_PROVIDER_SITE_OTHER): Payer: Medicare Other | Admitting: Family Medicine

## 2011-06-13 VITALS — BP 120/66 | HR 60 | Temp 97.6°F | Ht 66.75 in | Wt 164.5 lb

## 2011-06-13 DIAGNOSIS — E7800 Pure hypercholesterolemia, unspecified: Secondary | ICD-10-CM

## 2011-06-13 DIAGNOSIS — N259 Disorder resulting from impaired renal tubular function, unspecified: Secondary | ICD-10-CM

## 2011-06-13 DIAGNOSIS — I10 Essential (primary) hypertension: Secondary | ICD-10-CM

## 2011-06-13 MED ORDER — ATORVASTATIN CALCIUM 40 MG PO TABS: 40.0000 mg | ORAL_TABLET | Freq: Every day | ORAL | Status: DC

## 2011-06-13 MED ORDER — AMLODIPINE BESYLATE 10 MG PO TABS: 10.0000 mg | ORAL_TABLET | Freq: Every day | ORAL | Status: DC

## 2011-06-13 NOTE — Assessment & Plan Note (Signed)
Good control with lipitor and diet Disc goals for lipids and reasons to control them Rev labs with pt Rev low sat fat diet in detail  F/u 6 mo

## 2011-06-13 NOTE — Assessment & Plan Note (Signed)
On norvasc (rarely lasix for edema) bp in fair control at this time  No changes needed  Disc lifstyle change with low sodium diet and exercise   F/u 6 mo  Labs rev Med refilled

## 2011-06-13 NOTE — Assessment & Plan Note (Signed)
Stable cr 1.4 Pt does know he needs to inc water intake  He will continue renal f/u/ good bp control

## 2011-06-13 NOTE — Patient Instructions (Signed)
If you are interested in a shingles/zoster vaccine - call your insurance to check on coverage,( you should not get it within 1 month of other vaccines) , then call us for a prescription  for it to take to a pharmacy that gives the shot or get it here Labs look ok  Keep watching diet for fats and sugars / and work on good water intake  Follow up in 6 months

## 2011-06-13 NOTE — Progress Notes (Signed)
Subjective:    Patient ID: Matthew [name], male    DOB: July 28, 1924, 78 y.o.   MRN: [id number]  HPI Here for f/u of chronic med problems  Is feeling pretty good  Is pretty busy and active - still does yard work - and walks indoors or treadmill   [employer] is up 4 lb with bmi of 25 Ate too much junk over the holidays  Edema- none   On lipitor for lipids and good diet Lab Results  Component Value Date   CHOL 145 06/05/2011   CHOL 173 07/11/2010   CHOL 152 01/06/2010   Lab Results  Component Value Date   HDL 38.00* 06/05/2011   HDL 95.62 07/11/2010   HDL 13.08 01/06/2010   Lab Results  Component Value Date   LDLCALC 92 06/05/2011   LDLCALC 115* 07/11/2010   LDLCALC 103* 01/06/2010   Lab Results  Component Value Date   TRIG 73.0 06/05/2011   TRIG 73.0 07/11/2010   TRIG 49.0 01/06/2010   Lab Results  Component Value Date   CHOLHDL 4 06/05/2011   CHOLHDL 4 07/11/2010   CHOLHDL 4 01/06/2010   No results found for this basename: LDLDIRECT    Very good control and improved  Hx of renal insuff Lab Results  Component Value Date   CREATININE 1.4 06/05/2011   BUN 24* 06/05/2011   NA 141 06/05/2011   K 4.3 06/05/2011   CL 108 06/05/2011   CO2 27 06/05/2011   has to work at drinking more water  Sees renal - last check was doing better  On lasix just as needed- does not have to take hardly at all  psa is stable  Zoster status- has not had the vaccine -   Flu shot- did get one in the fall sept - has not had the flu  Patient Active Problem List  Diagnoses  . HYPERCHOLESTEROLEMIA  . RESTLESS LEG SYNDROME  . ALLERGIC CONJUNCTIVITIS  . HYPERTENSION  . ALLERGIC RHINITIS  . CONSTIPATION, CHRONIC  . RENAL INSUFFICIENCY  . VERTIGO  . EDEMA  . Nonspecific (abnormal) findings on radiological and other examination of body structure  . SKIN CANCER, HX OF  . ABNORMAL CHEST XRAY  . Prostate cancer screening   Past Medical History  Diagnosis Date  . Allergic rhinitis   . History of transfusion of whole  blood   . Melanoma     history of, by right eye  . Hypertension   . Renal insufficiency   . Chronic kidney disease     stage 3, [place] benign appearing cysts 05/2006  . Pulmonary nodules     being obs with serial CT, 6 month f/u stable 10/05  . Chronic constipation   . History of CT scan of chest 6/09    calcification consistent with asbestos exp, also pleural nodule, incidental gallstones   Past Surgical History  Procedure Date  . Ganglion cyst excision     hands  . 2d echo 6/09    slight thickening of aortic valve with EF of 60%   History  Substance Use Topics  . Smoking status: Former Games developer  . Smokeless tobacco: Not on file   Comment: Quit smoking in 1957.  Marland Kitchen Alcohol Use: Not on file   Family History  Problem Relation Age of Onset  . Hypertension Mother   . Hypertension Father   . Stroke Father   . Cancer Brother     pancreatic   Allergies  Allergen Reactions  .  Chlorthalidone     REACTION: frequent urination  . Codeine     REACTION: nausea  . Iodine     REACTION: caused bubble to come up in eye  . Penicillins     REACTION: GI  . Ramipril     REACTION: cough  . Ropinirole Hydrochloride     REACTION: insomnia   Current Outpatient Prescriptions on File Prior to Visit  Medication Sig Dispense Refill  . aspirin 81 MG tablet Take 81 mg by mouth daily.        . Cholecalciferol (VITAMIN D) 1000 UNITS capsule Take 1,000 Units by mouth daily.        . Omega-3 Fatty Acids (FISH OIL) 1200 MG CAPS Take by mouth daily.        . polyethylene glycol powder (MIRALAX) powder Take otc as directed       . benzonatate (TESSALON PERLES) 100 MG capsule Take 1 capsule (100 mg total) by mouth every 6 (six) hours as needed for cough.  30 capsule  0  . furosemide (LASIX) 20 MG tablet Take one by mouth once daily as needed for swelling       . Glycerin, Laxative, (GLYCERIN ADULT) 3 G SUPP Place rectally. OTC as directed           Review of Systems Review of Systems    Constitutional: Negative for fever, appetite change, fatigue and unexpected weight change.  Eyes: Negative for pain and visual disturbance.  Respiratory: Negative for cough and shortness of breath.   Cardiovascular: Negative for cp or palpitations    Gastrointestinal: Negative for nausea, diarrhea and constipation.  Genitourinary: Negative for urgency and frequency.  Skin: Negative for pallor or rash   Neurological: Negative for weakness, light-headedness, numbness and headaches.  Hematological: Negative for adenopathy. Does not bruise/bleed easily.  Psychiatric/Behavioral: Negative for dysphoric mood. The patient is not nervous/anxious.          Objective:   Physical Exam  Constitutional: He appears well-developed and well-nourished. No distress.       Elderly well appearing male  HENT:  Head: Normocephalic and atraumatic.  Mouth/Throat: Oropharynx is clear and moist.  Eyes: Conjunctivae and EOM are normal. Pupils are equal, round, and reactive to light. No scleral icterus.  Neck: Normal range of motion. Neck supple. No JVD present. Carotid bruit is not present. No thyromegaly present.  Cardiovascular: Normal rate, regular rhythm, normal heart sounds and intact distal pulses.  Exam reveals no gallop.   Pulmonary/Chest: Effort normal and breath sounds normal. No respiratory distress. He has no wheezes. He exhibits no tenderness.  Abdominal: Soft. Bowel sounds are normal. He exhibits no distension, no abdominal bruit and no mass. There is no tenderness.  Musculoskeletal: Normal range of motion. He exhibits edema. He exhibits no tenderness.       Trace pedal edema bilaterally  Lymphadenopathy:    He has no cervical adenopathy.  Neurological: He is alert. He has normal reflexes. No cranial nerve deficit. He exhibits normal muscle tone. Coordination normal.  Skin: Skin is warm and dry. No rash noted. No pallor.       Area of erythema on R pinna - with scale (per pt site of recent bx --  ? Squamous cell carcinoma)-- dermatology is caring for   Psychiatric: He has a normal mood and affect.          Assessment & Plan:

## 2011-07-12 ENCOUNTER — Telehealth: Payer: Self-pay | Admitting: *Deleted

## 2011-07-12 NOTE — Telephone Encounter (Signed)
That is fine- go ahead and do it

## 2011-07-12 NOTE — Telephone Encounter (Signed)
[facility] notified as instructed by phone.

## 2011-07-12 NOTE — Telephone Encounter (Signed)
Pharmacy wants to change the atorvastatin and amlodipine scripts that were sent in on 06/13/11 to #90 each with 2 refills each.  OK given to change.

## 2011-08-21 ENCOUNTER — Telehealth: Payer: Self-pay | Admitting: *Deleted

## 2011-08-21 NOTE — Telephone Encounter (Signed)
Patient called to scheduled shingles vaccine.  He stated that he checked with his insurance company and they will cover it in the doctors office.  Patient scheduled for nurse visit on 08/23/2011.

## 2011-08-23 ENCOUNTER — Ambulatory Visit (INDEPENDENT_AMBULATORY_CARE_PROVIDER_SITE_OTHER): Payer: Medicare Other | Admitting: *Deleted

## 2011-08-23 DIAGNOSIS — Z2911 Encounter for prophylactic immunotherapy for respiratory syncytial virus (RSV): Secondary | ICD-10-CM

## 2011-08-23 DIAGNOSIS — Z23 Encounter for immunization: Secondary | ICD-10-CM

## 2011-12-11 ENCOUNTER — Encounter: Payer: Self-pay | Admitting: Family Medicine

## 2011-12-11 ENCOUNTER — Ambulatory Visit (INDEPENDENT_AMBULATORY_CARE_PROVIDER_SITE_OTHER): Payer: Medicare Other | Admitting: Family Medicine

## 2011-12-11 VITALS — BP 120/68 | HR 66 | Temp 97.7°F | Ht 69.0 in | Wt 157.2 lb

## 2011-12-11 DIAGNOSIS — N259 Disorder resulting from impaired renal tubular function, unspecified: Secondary | ICD-10-CM

## 2011-12-11 DIAGNOSIS — Z85828 Personal history of other malignant neoplasm of skin: Secondary | ICD-10-CM

## 2011-12-11 DIAGNOSIS — I10 Essential (primary) hypertension: Secondary | ICD-10-CM

## 2011-12-11 DIAGNOSIS — E7800 Pure hypercholesterolemia, unspecified: Secondary | ICD-10-CM

## 2011-12-11 LAB — COMPREHENSIVE METABOLIC PANEL
AST: 17 U/L (ref 0–37)
Albumin: 3.7 g/dL (ref 3.5–5.2)
Alkaline Phosphatase: 60 U/L (ref 39–117)
Glucose, Bld: 103 mg/dL — ABNORMAL HIGH (ref 70–99)
Potassium: 4.7 mEq/L (ref 3.5–5.1)
Sodium: 142 mEq/L (ref 135–145)
Total Protein: 6.6 g/dL (ref 6.0–8.3)

## 2011-12-11 MED ORDER — ATORVASTATIN CALCIUM 40 MG PO TABS: 40.0000 mg | ORAL_TABLET | Freq: Every day | ORAL | Status: DC

## 2011-12-11 MED ORDER — AMLODIPINE BESYLATE 10 MG PO TABS: 10.0000 mg | ORAL_TABLET | Freq: Every day | ORAL | Status: DC

## 2011-12-11 NOTE — Assessment & Plan Note (Signed)
Well controlled with statin and diet  lipitor refilled

## 2011-12-11 NOTE — Progress Notes (Signed)
Subjective:    Patient ID: Matthew [name], male    DOB: [birth year], 78 y.o.   MRN: [id number]  HPI Here for f/u of chronic conditions Energy level is down in general  Lost his appetite for breakfast- ? This is why   Wt is down 7 lb with bmi of 23  Is generally tired  ? If it is age related a bit    bp is  good   Today BP Readings from Last 3 Encounters:  12/11/11 120/68  06/13/11 120/66  02/07/11 122/70    No cp or palpitations or headaches or edema  No side effects to medicines   On norvasc--not much swelling at all - has not needed a prn lasix in a very long time   Renal insuff --only goes once per year at this point  Needs labs   Lipid Lab Results  Component Value Date   CHOL 145 06/05/2011   HDL 38.00* 06/05/2011   LDLCALC 92 06/05/2011   TRIG 73.0 06/05/2011   CHOLHDL 4 06/05/2011   on lipitor and diet   Still doing some exercise- walking outside when it is nice -and has exercise machine at home  Patient Active Problem List  Diagnosis  . HYPERCHOLESTEROLEMIA  . RESTLESS LEG SYNDROME  . ALLERGIC CONJUNCTIVITIS  . HYPERTENSION  . ALLERGIC RHINITIS  . CONSTIPATION, CHRONIC  . RENAL INSUFFICIENCY  . VERTIGO  . EDEMA  . Nonspecific (abnormal) findings on radiological and other examination of body structure  . SKIN CANCER, HX OF  . ABNORMAL CHEST XRAY  . Prostate cancer screening   Past Medical History  Diagnosis Date  . Allergic rhinitis   . History of transfusion of whole blood   . Melanoma     history of, by right eye  . Hypertension   . Renal insufficiency   . Chronic kidney disease     stage 3, [place] benign appearing cysts 05/2006  . Pulmonary nodules     being obs with serial CT, 6 month f/u stable 10/05  . Chronic constipation   . History of CT scan of chest 6/09    calcification consistent with asbestos exp, also pleural nodule, incidental gallstones   Past Surgical History  Procedure Date  . Ganglion cyst excision     hands  . 2d echo 6/09   slight thickening of aortic valve with EF of 60%   History  Substance Use Topics  . Smoking status: Former Games developer  . Smokeless tobacco: Not on file   Comment: Quit smoking in 1957.  Marland Kitchen Alcohol Use: No   Family History  Problem Relation Age of Onset  . Hypertension Mother   . Hypertension Father   . Stroke Father   . Cancer Brother     pancreatic   Allergies  Allergen Reactions  . Chlorthalidone     REACTION: frequent urination  . Codeine     REACTION: nausea  . Iodine     REACTION: caused bubble to come up in eye  . Penicillins     REACTION: GI  . Ramipril     REACTION: cough  . Ropinirole Hydrochloride     REACTION: insomnia   Current Outpatient Prescriptions on File Prior to Visit  Medication Sig Dispense Refill  . amLODipine (NORVASC) 10 MG tablet Take 1 tablet (10 mg total) by mouth daily.  30 tablet  11  . aspirin 81 MG tablet Take 81 mg by mouth daily.        Marland Kitchen  atorvastatin (LIPITOR) 40 MG tablet Take 1 tablet (40 mg total) by mouth daily.  30 tablet  11  . Cholecalciferol (VITAMIN D) 1000 UNITS capsule Take 1,000 Units by mouth daily.        . Omega-3 Fatty Acids (FISH OIL) 1200 MG CAPS Take by mouth daily.        . polyethylene glycol powder (MIRALAX) powder Take otc as directed       . benzonatate (TESSALON PERLES) 100 MG capsule Take 1 capsule (100 mg total) by mouth every 6 (six) hours as needed for cough.  30 capsule  0  . furosemide (LASIX) 20 MG tablet Take one by mouth once daily as needed for swelling       . Glycerin, Laxative, (GLYCERIN ADULT) 3 G SUPP Place rectally. OTC as directed          Review of Systems Review of Systems  Constitutional: Negative for fever, appetite change,  and unexpected weight change.  Eyes: Negative for pain and visual disturbance.  Respiratory: Negative for cough and shortness of breath.   Cardiovascular: Negative for cp or palpitations    Gastrointestinal: Negative for nausea, diarrhea and constipation.  Genitourinary:  Negative for urgency and frequency.  Skin: Negative for pallor or rash  ? Area of skin carcinoma on R ear is bigger  Neurological: Negative for weakness, light-headedness, numbness and headaches.  Hematological: Negative for adenopathy. Does not bruise/bleed easily.  Psychiatric/Behavioral: Negative for dysphoric mood. The patient is not nervous/anxious.         Objective:   Physical Exam  Constitutional: He appears well-developed and well-nourished. No distress.  HENT:  Head: Normocephalic and atraumatic.  Mouth/Throat: Oropharynx is clear and moist.  Eyes: Conjunctivae and EOM are normal. Pupils are equal, round, and reactive to light. No scleral icterus.  Neck: Normal range of motion. Neck supple. No JVD present. No thyromegaly present.  Cardiovascular: Normal rate, normal heart sounds and intact distal pulses.  Exam reveals no gallop.   Pulmonary/Chest: Effort normal and breath sounds normal. No respiratory distress. He has no wheezes.       No crackles   Abdominal: Soft. Bowel sounds are normal.  Musculoskeletal: He exhibits no edema and no tenderness.  Lymphadenopathy:    He has no cervical adenopathy.  Neurological: He is alert. He has normal reflexes. No cranial nerve deficit. He exhibits normal muscle tone. Coordination normal.  Skin: Skin is warm and dry. No rash noted. There is erythema. No pallor.       Erythematous mass - which is rubbery and vascular growing off on R pinna  Psychiatric: He has a normal mood and affect.          Assessment & Plan:

## 2011-12-11 NOTE — Assessment & Plan Note (Signed)
Multifactorial- hx of HTN  Yearly renal f/u Lab today

## 2011-12-11 NOTE — Assessment & Plan Note (Signed)
bp in fair control at this time  No changes needed  Disc lifstyle change with low sodium diet and exercise   No edema with amlodipine Labs today

## 2011-12-11 NOTE — Assessment & Plan Note (Signed)
Lesion on R pinna looks larger to me  Re to derm for re eval

## 2011-12-11 NOTE — Patient Instructions (Addendum)
Keep moving - that is important  Take naps during the day if you need to  Try a meal supplement for breakfast- like carnation instant breakfast or slim fast shake We will do dermatology referral at check out  Lab today Follow up in 6 months for annual exam with labs prior

## 2012-06-04 ENCOUNTER — Telehealth: Payer: Self-pay | Admitting: Family Medicine

## 2012-06-04 DIAGNOSIS — N259 Disorder resulting from impaired renal tubular function, unspecified: Secondary | ICD-10-CM

## 2012-06-04 DIAGNOSIS — R609 Edema, unspecified: Secondary | ICD-10-CM

## 2012-06-04 DIAGNOSIS — E7800 Pure hypercholesterolemia, unspecified: Secondary | ICD-10-CM

## 2012-06-04 DIAGNOSIS — I10 Essential (primary) hypertension: Secondary | ICD-10-CM

## 2012-06-04 NOTE — Telephone Encounter (Signed)
Message copied by Judy Pimple on Tue Jun 04, 2012 10:01 PM ------      Message from: Baldomero Lamy      Created: Fri May 24, 2012  1:52 PM      Regarding: Cpx labs Wed 06/05/12       Please order  future cpx labs for pt's upcoming lab appt.      Thanks      Rodney Booze

## 2012-06-05 ENCOUNTER — Other Ambulatory Visit (INDEPENDENT_AMBULATORY_CARE_PROVIDER_SITE_OTHER): Payer: Medicare Other

## 2012-06-05 DIAGNOSIS — R609 Edema, unspecified: Secondary | ICD-10-CM

## 2012-06-05 DIAGNOSIS — N259 Disorder resulting from impaired renal tubular function, unspecified: Secondary | ICD-10-CM

## 2012-06-05 DIAGNOSIS — I10 Essential (primary) hypertension: Secondary | ICD-10-CM

## 2012-06-05 DIAGNOSIS — E7800 Pure hypercholesterolemia, unspecified: Secondary | ICD-10-CM

## 2012-06-05 LAB — CBC WITH DIFFERENTIAL/PLATELET
Basophils Absolute: 0 10*3/uL (ref 0.0–0.1)
Eosinophils Absolute: 0.4 10*3/uL (ref 0.0–0.7)
MCHC: 33.1 g/dL (ref 30.0–36.0)
MCV: 93 fl (ref 78.0–100.0)
Monocytes Absolute: 0.6 10*3/uL (ref 0.1–1.0)
Neutrophils Relative %: 62.5 % (ref 43.0–77.0)
Platelets: 206 10*3/uL (ref 150.0–400.0)
RDW: 13.1 % (ref 11.5–14.6)
WBC: 8.6 10*3/uL (ref 4.5–10.5)

## 2012-06-05 LAB — COMPREHENSIVE METABOLIC PANEL
AST: 26 U/L (ref 0–37)
Albumin: 4 g/dL (ref 3.5–5.2)
Alkaline Phosphatase: 82 U/L (ref 39–117)
Potassium: 4 mEq/L (ref 3.5–5.1)
Sodium: 141 mEq/L (ref 135–145)
Total Protein: 7.2 g/dL (ref 6.0–8.3)

## 2012-06-05 LAB — LIPID PANEL
Cholesterol: 168 mg/dL (ref 0–200)
VLDL: 18 mg/dL (ref 0.0–40.0)

## 2012-06-05 LAB — TSH: TSH: 3.58 u[IU]/mL (ref 0.35–5.50)

## 2012-06-12 ENCOUNTER — Encounter: Payer: Medicare Other | Admitting: Family Medicine

## 2012-06-18 ENCOUNTER — Encounter: Payer: Self-pay | Admitting: Family Medicine

## 2012-06-18 ENCOUNTER — Ambulatory Visit (INDEPENDENT_AMBULATORY_CARE_PROVIDER_SITE_OTHER): Payer: Medicare Other | Admitting: Family Medicine

## 2012-06-18 VITALS — BP 130/80 | HR 70 | Temp 97.8°F | Ht 67.0 in | Wt 162.0 lb

## 2012-06-18 DIAGNOSIS — I10 Essential (primary) hypertension: Secondary | ICD-10-CM

## 2012-06-18 DIAGNOSIS — Z23 Encounter for immunization: Secondary | ICD-10-CM

## 2012-06-18 DIAGNOSIS — N259 Disorder resulting from impaired renal tubular function, unspecified: Secondary | ICD-10-CM

## 2012-06-18 DIAGNOSIS — E7800 Pure hypercholesterolemia, unspecified: Secondary | ICD-10-CM

## 2012-06-18 MED ORDER — TETANUS-DIPHTHERIA TOXOIDS TD 5-2 LFU IM INJ: 0.5000 mL | INJECTION | Freq: Once | INTRAMUSCULAR | Status: DC

## 2012-06-18 NOTE — Assessment & Plan Note (Signed)
Disc goals for lipids and reasons to control them Doing well on lipitor and diet  Rev labs with pt Rev low sat fat diet in detail

## 2012-06-18 NOTE — Patient Instructions (Addendum)
Tetanus shot today No change in medicines Stay active- I'm glad you are doing well

## 2012-06-18 NOTE — Assessment & Plan Note (Signed)
Followed by renal Lab Results  Component Value Date   CREATININE 1.4 06/05/2012   no edema

## 2012-06-18 NOTE — Assessment & Plan Note (Signed)
bp in fair control at this time  No changes needed  Disc lifstyle change with low sodium diet and exercise  Labs reviewed today Enc to stay as active as possible

## 2012-06-18 NOTE — Progress Notes (Signed)
Subjective:    Patient ID: Matthew [name], male    DOB: 1924-10-16, 79 y.o.   MRN: [id number]  HPI Here for check up of chronic medical conditions and to review health mt list   Thinks he is doing about the same  No new problems   Wt is up 5 lb with bmi of 25 Is drinking boost for breakfast - feels better doing this  No swelling at al   Renal insuff- overall stable to improved Does not drink enough fluids -- is honest about that    Chemistry      Component Value Date/Time   NA 141 06/05/2012 0847   K 4.0 06/05/2012 0847   CL 107 06/05/2012 0847   CO2 29 06/05/2012 0847   BUN 26* 06/05/2012 0847   CREATININE 1.4 06/05/2012 0847      Component Value Date/Time   CALCIUM 9.3 06/05/2012 0847   ALKPHOS 82 06/05/2012 0847   AST 26 06/05/2012 0847   ALT 32 06/05/2012 0847   BILITOT 0.9 06/05/2012 0847       bp is stable today  No cp or palpitations or headaches or edema  No side effects to medicines  BP Readings from Last 3 Encounters:  06/18/12 130/80  12/11/11 120/68  06/13/11 120/66     Hyperlipidemia Lab Results  Component Value Date   CHOL 168 06/05/2012   CHOL 145 06/05/2011   CHOL 173 07/11/2010   Lab Results  Component Value Date   HDL 51.10 06/05/2012   HDL 38.00* 06/05/2011   HDL 82.95 07/11/2010   Lab Results  Component Value Date   LDLCALC 99 06/05/2012   LDLCALC 92 06/05/2011   LDLCALC 115* 07/11/2010   Lab Results  Component Value Date   TRIG 90.0 06/05/2012   TRIG 73.0 06/05/2011   TRIG 73.0 07/11/2010   Lab Results  Component Value Date   CHOLHDL 3 06/05/2012   CHOLHDL 4 06/05/2011   CHOLHDL 4 07/11/2010   No results found for this basename: LDLDIRECT   walking much more - treadmill and outdoors   Flu shot- got that at a pharmacy in october  Td is due 10 years  colonosc 11/11  Falls-none at all   Mood-  Very good/ no depression at all   No prostate problems at all - urination is normal    Patient Active Problem List  Diagnosis  . HYPERCHOLESTEROLEMIA  .  RESTLESS LEG SYNDROME  . ALLERGIC CONJUNCTIVITIS  . HYPERTENSION  . ALLERGIC RHINITIS  . CONSTIPATION, CHRONIC  . RENAL INSUFFICIENCY  . VERTIGO  . EDEMA  . Nonspecific (abnormal) findings on radiological and other examination of body structure  . SKIN CANCER, HX OF  . ABNORMAL CHEST XRAY  . Prostate cancer screening   Past Medical History  Diagnosis Date  . Allergic rhinitis   . History of transfusion of whole blood   . Melanoma     history of, by right eye  . Hypertension   . Renal insufficiency   . Chronic kidney disease     stage 3, [place] benign appearing cysts 05/2006  . Pulmonary nodules     being obs with serial CT, 6 month f/u stable 10/05  . Chronic constipation   . History of CT scan of chest 6/09    calcification consistent with asbestos exp, also pleural nodule, incidental gallstones   Past Surgical History  Procedure Laterality Date  . Ganglion cyst excision      hands  .  2d echo  6/09    slight thickening of aortic valve with EF of 60%   History  Substance Use Topics  . Smoking status: Former Games developer  . Smokeless tobacco: Not on file     Comment: Quit smoking in 1957.  Marland Kitchen Alcohol Use: No   Family History  Problem Relation Age of Onset  . Hypertension Mother   . Hypertension Father   . Stroke Father   . Cancer Brother     pancreatic   Allergies  Allergen Reactions  . Chlorthalidone     REACTION: frequent urination  . Codeine     REACTION: nausea  . Iodine     REACTION: caused bubble to come up in eye  . Penicillins     REACTION: GI  . Ramipril     REACTION: cough  . Ropinirole Hydrochloride     REACTION: insomnia   Current Outpatient Prescriptions on File Prior to Visit  Medication Sig Dispense Refill  . amLODipine (NORVASC) 10 MG tablet Take 1 tablet (10 mg total) by mouth daily.  90 tablet  3  . aspirin 81 MG tablet Take 81 mg by mouth daily.        Marland Kitchen atorvastatin (LIPITOR) 40 MG tablet Take 1 tablet (40 mg total) by mouth daily.  90  tablet  3  . Cholecalciferol (VITAMIN D) 1000 UNITS capsule Take 1,000 Units by mouth daily.        . Omega-3 Fatty Acids (FISH OIL) 1200 MG CAPS Take by mouth daily.        . polyethylene glycol powder (MIRALAX) powder Take otc as directed       . furosemide (LASIX) 20 MG tablet Take one by mouth once daily as needed for swelling       . Glycerin, Laxative, (GLYCERIN ADULT) 3 G SUPP Place rectally. OTC as directed        No current facility-administered medications on file prior to visit.    Review of Systems    Review of Systems  Constitutional: Negative for fever, appetite change, fatigue and unexpected weight change.  Eyes: Negative for pain and visual disturbance.  Respiratory: Negative for cough and shortness of breath.   Cardiovascular: Negative for cp or palpitations    Gastrointestinal: Negative for nausea, diarrhea and constipation.  Genitourinary: Negative for urgency and frequency.  Skin: Negative for pallor or rash   Neurological: Negative for weakness, light-headedness, numbness and headaches.  Hematological: Negative for adenopathy. Does not bruise/bleed easily.  Psychiatric/Behavioral: Negative for dysphoric mood. The patient is not nervous/anxious.      Objective:   Physical Exam  Constitutional: He appears well-developed and well-nourished. No distress.  Well appearing elderly male  HENT:  Head: Normocephalic and atraumatic.  Right Ear: External ear normal.  Left Ear: External ear normal.  Mouth/Throat: Oropharynx is clear and moist. No oropharyngeal exudate.  Scant cerumen bilat  Eyes: Conjunctivae and EOM are normal. Pupils are equal, round, and reactive to light. Right eye exhibits no discharge. Left eye exhibits no discharge. No scleral icterus.  Neck: Normal range of motion. Neck supple. No JVD present. Carotid bruit is not present. No thyromegaly present.  Cardiovascular: Normal rate, regular rhythm, normal heart sounds and intact distal pulses.  Exam  reveals no gallop.   Pulmonary/Chest: Effort normal and breath sounds normal. No respiratory distress. He has no wheezes.  No crackles  Abdominal: Soft. Bowel sounds are normal. He exhibits no distension, no abdominal bruit and no mass. There is  no tenderness.  Musculoskeletal: Normal range of motion. He exhibits edema. He exhibits no tenderness.  Trace pedal edema  Lymphadenopathy:    He has no cervical adenopathy.  Neurological: He is alert. He has normal reflexes. No cranial nerve deficit. He exhibits normal muscle tone. Coordination normal.  Skin: Skin is warm and dry. No rash noted. No erythema. No pallor.  Keratoses noted along with diffuse solar aging   Psychiatric: He has a normal mood and affect.          Assessment & Plan:

## 2012-06-19 ENCOUNTER — Telehealth: Payer: Self-pay | Admitting: *Deleted

## 2012-06-19 NOTE — Telephone Encounter (Signed)
1 year is ok if no problems and he continues to see his renal doctor- he can go ahead and schedule that  If he is uncomfortable with that 6 mo is fine  thanks

## 2012-06-19 NOTE — Telephone Encounter (Signed)
Pt was here yesterday for a CPE and wanted to know when does he need to follow up with you, it didn't say on his AVS

## 2012-06-19 NOTE — Telephone Encounter (Signed)
Called pt and phone busy.  

## 2012-06-20 NOTE — Telephone Encounter (Signed)
Pt notified of Dr. Royden Purl comments and he is ok with the yearly CPE pt said he will call back closer to next Feb to schedule that appt

## 2012-12-17 ENCOUNTER — Encounter: Payer: Self-pay | Admitting: Family Medicine

## 2012-12-17 ENCOUNTER — Ambulatory Visit (INDEPENDENT_AMBULATORY_CARE_PROVIDER_SITE_OTHER): Payer: Medicare Other | Admitting: Family Medicine

## 2012-12-17 VITALS — BP 94/58 | HR 71 | Temp 97.6°F | Ht 67.0 in | Wt 157.2 lb

## 2012-12-17 DIAGNOSIS — E7800 Pure hypercholesterolemia, unspecified: Secondary | ICD-10-CM

## 2012-12-17 DIAGNOSIS — N259 Disorder resulting from impaired renal tubular function, unspecified: Secondary | ICD-10-CM

## 2012-12-17 DIAGNOSIS — I10 Essential (primary) hypertension: Secondary | ICD-10-CM

## 2012-12-17 LAB — COMPREHENSIVE METABOLIC PANEL
ALT: 14 U/L (ref 0–53)
Albumin: 4 g/dL (ref 3.5–5.2)
CO2: 24 mEq/L (ref 19–32)
Calcium: 9.1 mg/dL (ref 8.4–10.5)
Chloride: 106 mEq/L (ref 96–112)
GFR: 49.85 mL/min — ABNORMAL LOW (ref >60.00)
Glucose, Bld: 95 mg/dL (ref 70–99)
Potassium: 4.2 mEq/L (ref 3.5–5.1)
Sodium: 140 mEq/L (ref 135–145)
Total Protein: 7.2 g/dL (ref 6.0–8.3)

## 2012-12-17 LAB — LIPID PANEL
Cholesterol: 140 mg/dL (ref 0–200)
VLDL: 12.4 mg/dL (ref 0.0–40.0)

## 2012-12-17 NOTE — Patient Instructions (Addendum)
Blood pressure is well controlled  Try to drink more water and stay as active as you can  Lab today  Follow up in 6 months for annual exam with labs prior

## 2012-12-17 NOTE — Assessment & Plan Note (Signed)
bp in fair control at this time  No changes needed  Disc lifstyle change with low sodium diet and exercise  Pt is not dizzy with today's bp  Disc need to control bp for renal function  Lab today

## 2012-12-17 NOTE — Progress Notes (Signed)
Subjective:    Patient ID: Matthew [name], male    DOB: [birth year], 79 y.o.   MRN: [id number]  HPI Here for f/u of chronic medical problems   He is doing well overall  Feels his age more and more - his energy is lower as he gets older   Wt is down 5 lb with bmi of 24 At his usual weight  He stopped drinking boost - used to drink those 2 a day/ has cut down to 1  Appetite is pretty good   bp is down a bit today ( he says he just took his medicine) At home 120s/ 80s for the most part  No cp or palpitations or headaches or edema  No side effects to medicines  BP Readings from Last 3 Encounters:  12/17/12 94/58  06/18/12 130/80  12/11/11 120/68      Hyperlipidemia Statin and diet  Lab Results  Component Value Date   CHOL 168 06/05/2012   HDL 51.10 06/05/2012   LDLCALC 99 06/05/2012   TRIG 90.0 06/05/2012   CHOLHDL 3 06/05/2012     Renal insuff  Seen by renal - but not for a while  Since stable - has not been back (? 2012) Does not as a rule drink enough water    Chemistry      Component Value Date/Time   NA 141 06/05/2012 0847   K 4.0 06/05/2012 0847   CL 107 06/05/2012 0847   CO2 29 06/05/2012 0847   BUN 26* 06/05/2012 0847   CREATININE 1.4 06/05/2012 0847      Component Value Date/Time   CALCIUM 9.3 06/05/2012 0847   ALKPHOS 82 06/05/2012 0847   AST 26 06/05/2012 0847   ALT 32 06/05/2012 0847   BILITOT 0.9 06/05/2012 0847       Wt is down 5 lb with bmi of 24  Diet is healthy -stays away from red meat and sat fats- eating more vegetables   Patient Active Problem List   Diagnosis Date Noted  . Prostate cancer screening 06/04/2011  . CONSTIPATION, CHRONIC 01/12/2010  . Nonspecific (abnormal) findings on radiological and other examination of body structure 10/07/2007  . ALLERGIC CONJUNCTIVITIS 10/04/2007  . EDEMA 10/04/2007  . HYPERCHOLESTEROLEMIA 08/10/2006  . RESTLESS LEG SYNDROME 08/10/2006  . HYPERTENSION 08/10/2006  . ALLERGIC RHINITIS 08/10/2006  . RENAL INSUFFICIENCY  08/10/2006  . SKIN CANCER, HX OF 08/10/2006   Past Medical History  Diagnosis Date  . Allergic rhinitis   . History of transfusion of whole blood   . Melanoma     history of, by right eye  . Hypertension   . Renal insufficiency   . Chronic kidney disease     stage 3, [place] benign appearing cysts 05/2006  . Pulmonary nodules     being obs with serial CT, 6 month f/u stable 10/05  . Chronic constipation   . History of CT scan of chest 6/09    calcification consistent with asbestos exp, also pleural nodule, incidental gallstones   Past Surgical History  Procedure Laterality Date  . Ganglion cyst excision      hands  . 2d echo  6/09    slight thickening of aortic valve with EF of 60%   History  Substance Use Topics  . Smoking status: Former Games developer  . Smokeless tobacco: Not on file     Comment: Quit smoking in 1957.  Marland Kitchen Alcohol Use: No   Family History  Problem Relation Age of Onset  .  Hypertension Mother   . Hypertension Father   . Stroke Father   . Cancer Brother     pancreatic   Allergies  Allergen Reactions  . Chlorthalidone     REACTION: frequent urination  . Codeine     REACTION: nausea  . Iodine     REACTION: caused bubble to come up in eye  . Penicillins     REACTION: GI  . Ramipril     REACTION: cough  . Ropinirole Hydrochloride     REACTION: insomnia   Current Outpatient Prescriptions on File Prior to Visit  Medication Sig Dispense Refill  . amLODipine (NORVASC) 10 MG tablet Take 1 tablet (10 mg total) by mouth daily.  90 tablet  3  . aspirin 81 MG tablet Take 81 mg by mouth daily.        Marland Kitchen atorvastatin (LIPITOR) 40 MG tablet Take 1 tablet (40 mg total) by mouth daily.  90 tablet  3  . Cholecalciferol (VITAMIN D) 1000 UNITS capsule Take 1,000 Units by mouth daily.        . Omega-3 Fatty Acids (FISH OIL) 1200 MG CAPS Take by mouth daily.        . polyethylene glycol powder (MIRALAX) powder Take otc as directed       . furosemide (LASIX) 20 MG tablet Take  one by mouth once daily as needed for swelling        No current facility-administered medications on file prior to visit.    Review of Systems Review of Systems  Constitutional: Negative for fever, appetite change, fatigue and unexpected weight change.  Eyes: Negative for pain and visual disturbance.  Respiratory: Negative for cough and shortness of breath.   Cardiovascular: Negative for cp or palpitations    Gastrointestinal: Negative for nausea, diarrhea and constipation.  Genitourinary: Negative for urgency and frequency.  Skin: Negative for pallor or rash   MSK pos for arthritis stiffness/ neg for swollen joints  Neurological: Negative for weakness, light-headedness, numbness and headaches.  Hematological: Negative for adenopathy. Does not bruise/bleed easily.  Psychiatric/Behavioral: Negative for dysphoric mood. The patient is not nervous/anxious.         Objective:   Physical Exam  Constitutional: He appears well-developed and well-nourished. No distress.  HENT:  Head: Normocephalic and atraumatic.  Eyes: Conjunctivae and EOM are normal. Pupils are equal, round, and reactive to light. Right eye exhibits no discharge. Left eye exhibits no discharge. No scleral icterus.  Neck: Normal range of motion. Neck supple. No JVD present. Carotid bruit is not present. No thyromegaly present.  Cardiovascular: Normal rate, regular rhythm, normal heart sounds and intact distal pulses.  Exam reveals no gallop.   Pulmonary/Chest: Effort normal and breath sounds normal. No respiratory distress. He has no wheezes. He has no rales.  No crackles  Abdominal: Soft. Bowel sounds are normal. He exhibits no distension, no abdominal bruit and no mass. There is no tenderness.  Musculoskeletal: He exhibits no edema and no tenderness.  Lymphadenopathy:    He has no cervical adenopathy.  Neurological: He is alert. He has normal reflexes. No cranial nerve deficit. He exhibits normal muscle tone.  Coordination normal.  Skin: Skin is warm and dry. No rash noted. No erythema. No pallor.  Psychiatric: He has a normal mood and affect.          Assessment & Plan:

## 2012-12-17 NOTE — Assessment & Plan Note (Signed)
Disc goals for lipids and reasons to control them Rev labs with pt Rev low sat fat diet in detail Lab today 

## 2012-12-17 NOTE — Assessment & Plan Note (Signed)
Multifactorial / with hx of hypertensive nephropathy Per pt - no longer followed by renal  Will check cmet today  No urinary symptoms but does not drink enough water- enc him to do that

## 2012-12-18 ENCOUNTER — Encounter: Payer: Self-pay | Admitting: Family Medicine

## 2013-01-01 ENCOUNTER — Other Ambulatory Visit: Payer: Self-pay | Admitting: *Deleted

## 2013-01-01 MED ORDER — AMLODIPINE BESYLATE 10 MG PO TABS: 10.0000 mg | ORAL_TABLET | Freq: Every day | ORAL | Status: DC

## 2013-01-01 MED ORDER — ATORVASTATIN CALCIUM 40 MG PO TABS: 40.0000 mg | ORAL_TABLET | Freq: Every day | ORAL | Status: DC

## 2013-02-07 ENCOUNTER — Encounter: Payer: Self-pay | Admitting: Family Medicine

## 2013-02-07 ENCOUNTER — Ambulatory Visit (INDEPENDENT_AMBULATORY_CARE_PROVIDER_SITE_OTHER): Payer: Medicare Other | Admitting: Family Medicine

## 2013-02-07 VITALS — BP 134/82 | HR 71 | Temp 97.7°F | Ht 67.0 in | Wt 155.5 lb

## 2013-02-07 DIAGNOSIS — J209 Acute bronchitis, unspecified: Secondary | ICD-10-CM

## 2013-02-07 MED ORDER — AZITHROMYCIN 250 MG PO TABS: ORAL_TABLET | ORAL | Status: DC

## 2013-02-07 MED ORDER — BENZONATATE 200 MG PO CAPS: 200.0000 mg | ORAL_CAPSULE | Freq: Three times a day (TID) | ORAL | Status: DC | PRN

## 2013-02-07 NOTE — Progress Notes (Signed)
Subjective:    Patient ID: Matthew [name], male    DOB: Aug 31, 1924, 79 y.o.   MRN: [id number]  HPI Developed a cough and congestion in chest this week  Now sore throat from cough  Cough is hacking and worse at night -not brining anything up yet -- but he could hear rattling No wheeze  chlorcedin did not help No fever    Patient Active Problem List   Diagnosis Date Noted  . Prostate cancer screening 06/04/2011  . CONSTIPATION, CHRONIC 01/12/2010  . Nonspecific (abnormal) findings on radiological and other examination of body structure 10/07/2007  . ALLERGIC CONJUNCTIVITIS 10/04/2007  . EDEMA 10/04/2007  . HYPERCHOLESTEROLEMIA 08/10/2006  . RESTLESS LEG SYNDROME 08/10/2006  . HYPERTENSION 08/10/2006  . ALLERGIC RHINITIS 08/10/2006  . RENAL INSUFFICIENCY 08/10/2006  . SKIN CANCER, HX OF 08/10/2006   Past Medical History  Diagnosis Date  . Allergic rhinitis   . History of transfusion of whole blood   . Melanoma     history of, by right eye  . Hypertension   . Renal insufficiency   . Chronic kidney disease     stage 3, [place] benign appearing cysts 05/2006  . Pulmonary nodules     being obs with serial CT, 6 month f/u stable 10/05  . Chronic constipation   . History of CT scan of chest 6/09    calcification consistent with asbestos exp, also pleural nodule, incidental gallstones   Past Surgical History  Procedure Laterality Date  . Ganglion cyst excision      hands  . 2d echo  6/09    slight thickening of aortic valve with EF of 60%   History  Substance Use Topics  . Smoking status: Former Games developer  . Smokeless tobacco: Not on file     Comment: Quit smoking in 1957.  Marland Kitchen Alcohol Use: No   Family History  Problem Relation Age of Onset  . Hypertension Mother   . Hypertension Father   . Stroke Father   . Cancer Brother     pancreatic   Allergies  Allergen Reactions  . Chlorthalidone     REACTION: frequent urination  . Codeine     REACTION: nausea  . Iodine     REACTION: caused bubble to come up in eye  . Penicillins     REACTION: GI  . Ramipril     REACTION: cough  . Ropinirole Hydrochloride     REACTION: insomnia   Current Outpatient Prescriptions on File Prior to Visit  Medication Sig Dispense Refill  . amLODipine (NORVASC) 10 MG tablet Take 1 tablet (10 mg total) by mouth daily.  90 tablet  3  . aspirin 81 MG tablet Take 81 mg by mouth daily.        Marland Kitchen atorvastatin (LIPITOR) 40 MG tablet Take 1 tablet (40 mg total) by mouth daily.  90 tablet  3  . Cholecalciferol (VITAMIN D) 1000 UNITS capsule Take 1,000 Units by mouth daily.        . Omega-3 Fatty Acids (FISH OIL) 1200 MG CAPS Take by mouth daily.        . polyethylene glycol powder (MIRALAX) powder Take otc as directed       . furosemide (LASIX) 20 MG tablet Take one by mouth once daily as needed for swelling        No current facility-administered medications on file prior to visit.      Review of Systems Review of Systems  Constitutional: Negative for  fever, appetite change,  and unexpected weight change.  ENT pos for congestion/ post nasal drip / neg for facial pain  Eyes: Negative for pain and visual disturbance.  Respiratory: Negative for wheeze  and shortness of breath.   Cardiovascular: Negative for cp or palpitations    Gastrointestinal: Negative for nausea, diarrhea and constipation.  Genitourinary: Negative for urgency and frequency.  Skin: Negative for pallor or rash   Neurological: Negative for weakness, light-headedness, numbness and headaches.  Hematological: Negative for adenopathy. Does not bruise/bleed easily.  Psychiatric/Behavioral: Negative for dysphoric mood. The patient is not nervous/anxious.         Objective:   Physical Exam  Constitutional: He appears well-developed and well-nourished. No distress.  HENT:  Head: Normocephalic and atraumatic.  Right Ear: External ear normal.  Left Ear: External ear normal.  Mouth/Throat: Oropharynx is clear and  moist. No oropharyngeal exudate.  Nares are injected and congested  No sinus tenderness Throat- post inj and clear post nasal drip   Eyes: Conjunctivae and EOM are normal. Pupils are equal, round, and reactive to light. Right eye exhibits no discharge. Left eye exhibits no discharge. No scleral icterus.  Neck: Normal range of motion. Neck supple. No JVD present. No thyromegaly present.  Cardiovascular: Normal rate and regular rhythm.   Pulmonary/Chest: Effort normal and breath sounds normal. No respiratory distress. He has no wheezes.  Harsh bs with a few rhonchi in bilateral bases   Lymphadenopathy:    He has no cervical adenopathy.  Neurological: He is alert.  Skin: Skin is warm and dry. No rash noted. No pallor.  Psychiatric: He has a normal mood and affect.          Assessment & Plan:

## 2013-02-07 NOTE — Patient Instructions (Signed)
Take the zithromax as directed  Try tessalon for cough- swallow one pill whole up to three times daily  Drink a lot of fluids  Also for cough- try over the counter mucinex DM twice daily  Update if not starting to improve in a week or if worsening

## 2013-02-09 NOTE — Assessment & Plan Note (Signed)
Caught from his wife with junky but nonprod cough Cover with zpack Tessalon and mucinex dm for cough Disc symptomatic care - see instructions on AVS  Update if not starting to improve in a week or if worsening  e

## 2013-04-23 ENCOUNTER — Ambulatory Visit: Payer: Self-pay | Admitting: Family Medicine

## 2013-04-23 ENCOUNTER — Inpatient Hospital Stay: Payer: Self-pay | Admitting: Internal Medicine

## 2013-04-23 LAB — COMPREHENSIVE METABOLIC PANEL
Albumin: 3.3 g/dL — ABNORMAL LOW (ref 3.4–5.0)
Alkaline Phosphatase: 297 U/L — ABNORMAL HIGH
BUN: 31 mg/dL — ABNORMAL HIGH (ref 7–18)
Calcium, Total: 9.3 mg/dL (ref 8.5–10.1)
Chloride: 101 mmol/L (ref 98–107)
Co2: 26 mmol/L (ref 21–32)
EGFR (African American): 56 — ABNORMAL LOW
EGFR (Non-African Amer.): 49 — ABNORMAL LOW
Glucose: 120 mg/dL — ABNORMAL HIGH (ref 65–99)
Osmolality: 276 (ref 275–301)
Potassium: 3.5 mmol/L (ref 3.5–5.1)
SGOT(AST): 514 U/L — ABNORMAL HIGH (ref 15–37)
SGPT (ALT): 635 U/L — ABNORMAL HIGH (ref 12–78)
Sodium: 134 mmol/L — ABNORMAL LOW (ref 136–145)

## 2013-04-23 LAB — URINALYSIS, COMPLETE
Bacteria: NONE SEEN
Blood: NEGATIVE
Nitrite: NEGATIVE
Ph: 5 (ref 4.5–8.0)
RBC,UR: 1 /HPF (ref 0–5)
Specific Gravity: 1.02 (ref 1.003–1.030)
Squamous Epithelial: <1
WBC UR: 2 /HPF (ref 0–5)

## 2013-04-23 LAB — CBC WITH DIFFERENTIAL/PLATELET
Basophil #: 0 10*3/uL (ref 0.0–0.1)
Eosinophil %: 0.1 %
HCT: 46.7 % (ref 40.0–52.0)
HGB: 15.6 g/dL (ref 13.0–18.0)
MCH: 30.7 pg (ref 26.0–34.0)
MCHC: 33.5 g/dL (ref 32.0–36.0)
Monocyte %: 2.7 %
Neutrophil #: 11 10*3/uL — ABNORMAL HIGH (ref 1.4–6.5)
Neutrophil %: 93.3 %
Platelet: 177 10*3/uL (ref 150–440)
RBC: 5.1 10*6/uL (ref 4.40–5.90)
RDW: 13.5 % (ref 11.5–14.5)
WBC: 11.8 10*3/uL — ABNORMAL HIGH (ref 3.8–10.6)

## 2013-04-23 LAB — TROPONIN I: Troponin-I: <0.02 ng/mL

## 2013-04-24 LAB — URINALYSIS, COMPLETE
Bilirubin,UR: NEGATIVE
Glucose,UR: NEGATIVE mg/dL (ref 0–75)
Leukocyte Esterase: NEGATIVE
Ph: 6 (ref 4.5–8.0)
Protein: NEGATIVE
RBC,UR: NONE SEEN /HPF (ref 0–5)
Specific Gravity: 1.004 (ref 1.003–1.030)
Squamous Epithelial: NONE SEEN
WBC UR: 1 /HPF (ref 0–5)

## 2013-04-24 LAB — COMPREHENSIVE METABOLIC PANEL
Albumin: 2.6 g/dL — ABNORMAL LOW (ref 3.4–5.0)
Alkaline Phosphatase: 238 U/L — ABNORMAL HIGH
Anion Gap: 8 (ref 7–16)
BUN: 24 mg/dL — ABNORMAL HIGH (ref 7–18)
Bilirubin,Total: 3.9 mg/dL — ABNORMAL HIGH (ref 0.2–1.0)
Calcium, Total: 8.7 mg/dL (ref 8.5–10.1)
Chloride: 106 mmol/L (ref 98–107)
Creatinine: 1.34 mg/dL — ABNORMAL HIGH (ref 0.60–1.30)
EGFR (Non-African Amer.): 48 — ABNORMAL LOW
SGOT(AST): 284 U/L — ABNORMAL HIGH (ref 15–37)
SGPT (ALT): 431 U/L — ABNORMAL HIGH (ref 12–78)
Sodium: 138 mmol/L (ref 136–145)
Total Protein: 5.8 g/dL — ABNORMAL LOW (ref 6.4–8.2)

## 2013-04-24 LAB — CBC WITH DIFFERENTIAL/PLATELET
Basophil %: 0.2 %
Eosinophil #: 0.1 10*3/uL (ref 0.0–0.7)
Eosinophil %: 0.6 %
HCT: 36.5 % — ABNORMAL LOW (ref 40.0–52.0)
HGB: 12.5 g/dL — ABNORMAL LOW (ref 13.0–18.0)
Lymphocyte #: 1.3 10*3/uL (ref 1.0–3.6)
MCH: 31.1 pg (ref 26.0–34.0)
MCHC: 34.3 g/dL (ref 32.0–36.0)
MCV: 91 fL (ref 80–100)
Monocyte #: 1.1 x10 3/mm — ABNORMAL HIGH (ref 0.2–1.0)
Monocyte %: 7.9 %
Neutrophil #: 11.6 10*3/uL — ABNORMAL HIGH (ref 1.4–6.5)
Neutrophil %: 82.3 %
RBC: 4.02 10*6/uL — ABNORMAL LOW (ref 4.40–5.90)
RDW: 13.7 % (ref 11.5–14.5)
WBC: 14.1 10*3/uL — ABNORMAL HIGH (ref 3.8–10.6)

## 2013-04-25 LAB — CBC WITH DIFFERENTIAL/PLATELET
Basophil #: 0.1 10*3/uL (ref 0.0–0.1)
Eosinophil %: 4.1 %
HCT: 37.1 % — ABNORMAL LOW (ref 40.0–52.0)
HGB: 12.7 g/dL — ABNORMAL LOW (ref 13.0–18.0)
MCHC: 34.3 g/dL (ref 32.0–36.0)
MCV: 91 fL (ref 80–100)
Monocyte #: 0.9 x10 3/mm (ref 0.2–1.0)
Monocyte %: 9.3 %
Neutrophil #: 7.6 10*3/uL — ABNORMAL HIGH (ref 1.4–6.5)
Neutrophil %: 76.1 %
RDW: 13.5 % (ref 11.5–14.5)
WBC: 10 10*3/uL (ref 3.8–10.6)

## 2013-04-25 LAB — COMPREHENSIVE METABOLIC PANEL
Albumin: 2.4 g/dL — ABNORMAL LOW (ref 3.4–5.0)
BUN: 20 mg/dL — ABNORMAL HIGH (ref 7–18)
Bilirubin,Total: 1.2 mg/dL — ABNORMAL HIGH (ref 0.2–1.0)
Calcium, Total: 8.6 mg/dL (ref 8.5–10.1)
Chloride: 109 mmol/L — ABNORMAL HIGH (ref 98–107)
Co2: 26 mmol/L (ref 21–32)
Creatinine: 1.23 mg/dL (ref 0.60–1.30)
EGFR (African American): >60
EGFR (Non-African Amer.): 53 — ABNORMAL LOW
Glucose: 72 mg/dL (ref 65–99)
Potassium: 3.5 mmol/L (ref 3.5–5.1)
SGOT(AST): 135 U/L — ABNORMAL HIGH (ref 15–37)
Sodium: 141 mmol/L (ref 136–145)
Total Protein: 5.7 g/dL — ABNORMAL LOW (ref 6.4–8.2)

## 2013-04-25 LAB — URINE CULTURE

## 2013-04-26 LAB — CBC WITH DIFFERENTIAL/PLATELET
Basophil %: 0.4 %
Eosinophil #: 0.5 10*3/uL (ref 0.0–0.7)
Eosinophil %: 4.9 %
HCT: 39.2 % — ABNORMAL LOW (ref 40.0–52.0)
HGB: 13.3 g/dL (ref 13.0–18.0)
Lymphocyte #: 1.3 10*3/uL (ref 1.0–3.6)
MCH: 30.9 pg (ref 26.0–34.0)
Monocyte %: 12.6 %
Neutrophil #: 6.5 10*3/uL (ref 1.4–6.5)
Neutrophil %: 68.6 %
Platelet: 186 10*3/uL (ref 150–440)
RBC: 4.32 10*6/uL — ABNORMAL LOW (ref 4.40–5.90)
RDW: 13.3 % (ref 11.5–14.5)

## 2013-04-26 LAB — COMPREHENSIVE METABOLIC PANEL
Albumin: 2.5 g/dL — ABNORMAL LOW (ref 3.4–5.0)
Alkaline Phosphatase: 234 U/L — ABNORMAL HIGH
Anion Gap: 7 (ref 7–16)
BUN: 19 mg/dL — ABNORMAL HIGH (ref 7–18)
Bilirubin,Total: 0.9 mg/dL (ref 0.2–1.0)
Calcium, Total: 8.3 mg/dL — ABNORMAL LOW (ref 8.5–10.1)
Chloride: 108 mmol/L — ABNORMAL HIGH (ref 98–107)
Creatinine: 1.13 mg/dL (ref 0.60–1.30)
EGFR (African American): >60
EGFR (Non-African Amer.): 59 — ABNORMAL LOW
Glucose: 96 mg/dL (ref 65–99)
Osmolality: 282 (ref 275–301)
Potassium: 3.5 mmol/L (ref 3.5–5.1)
SGOT(AST): 68 U/L — ABNORMAL HIGH (ref 15–37)
SGPT (ALT): 199 U/L — ABNORMAL HIGH (ref 12–78)
Sodium: 140 mmol/L (ref 136–145)

## 2013-04-27 LAB — CBC WITH DIFFERENTIAL/PLATELET
Basophil #: 0 10*3/uL (ref 0.0–0.1)
Eosinophil #: 0.1 10*3/uL (ref 0.0–0.7)
HCT: 39 % — ABNORMAL LOW (ref 40.0–52.0)
Lymphocyte %: 10.2 %
MCHC: 33.5 g/dL (ref 32.0–36.0)
MCV: 91 fL (ref 80–100)
Monocyte %: 9.2 %
Platelet: 194 10*3/uL (ref 150–440)
RBC: 4.3 10*6/uL — ABNORMAL LOW (ref 4.40–5.90)
RDW: 13.4 % (ref 11.5–14.5)

## 2013-04-27 LAB — BASIC METABOLIC PANEL
BUN: 14 mg/dL (ref 7–18)
Chloride: 108 mmol/L — ABNORMAL HIGH (ref 98–107)
EGFR (African American): >60
EGFR (Non-African Amer.): >60
Glucose: 110 mg/dL — ABNORMAL HIGH (ref 65–99)
Potassium: 4.2 mmol/L (ref 3.5–5.1)
Sodium: 139 mmol/L (ref 136–145)

## 2013-04-27 LAB — HEPATIC FUNCTION PANEL A (ARMC)
Alkaline Phosphatase: 204 U/L — ABNORMAL HIGH
Bilirubin, Direct: 0.4 mg/dL — ABNORMAL HIGH (ref 0.00–0.20)
Bilirubin,Total: 0.8 mg/dL (ref 0.2–1.0)
SGOT(AST): 101 U/L — ABNORMAL HIGH (ref 15–37)

## 2013-04-27 LAB — LIPASE, BLOOD: Lipase: 67 U/L — ABNORMAL LOW (ref 73–393)

## 2013-04-29 LAB — PATHOLOGY REPORT

## 2013-04-29 LAB — CULTURE, BLOOD (SINGLE)

## 2013-05-09 ENCOUNTER — Ambulatory Visit: Payer: Medicare Other | Admitting: Family Medicine

## 2013-05-13 DIAGNOSIS — Z48815 Encounter for surgical aftercare following surgery on the digestive system: Secondary | ICD-10-CM

## 2013-05-13 DIAGNOSIS — R609 Edema, unspecified: Secondary | ICD-10-CM

## 2013-05-13 DIAGNOSIS — Z48814 Encounter for surgical aftercare following surgery on the teeth or oral cavity: Secondary | ICD-10-CM

## 2013-05-13 DIAGNOSIS — I10 Essential (primary) hypertension: Secondary | ICD-10-CM

## 2013-05-16 ENCOUNTER — Encounter: Payer: Self-pay | Admitting: Family Medicine

## 2013-05-16 ENCOUNTER — Ambulatory Visit (INDEPENDENT_AMBULATORY_CARE_PROVIDER_SITE_OTHER): Payer: Medicare HMO | Admitting: Family Medicine

## 2013-05-16 VITALS — BP 132/84 | HR 82 | Temp 97.6°F | Ht 67.0 in | Wt 157.2 lb

## 2013-05-16 DIAGNOSIS — R609 Edema, unspecified: Secondary | ICD-10-CM

## 2013-05-16 DIAGNOSIS — Z9889 Other specified postprocedural states: Secondary | ICD-10-CM

## 2013-05-16 DIAGNOSIS — Z9049 Acquired absence of other specified parts of digestive tract: Secondary | ICD-10-CM | POA: Insufficient documentation

## 2013-05-16 LAB — COMPREHENSIVE METABOLIC PANEL
ALT: 38 U/L (ref 0–53)
AST: 26 U/L (ref 0–37)
Albumin: 3.9 g/dL (ref 3.5–5.2)
Alkaline Phosphatase: 105 U/L (ref 39–117)
BILIRUBIN TOTAL: 1.1 mg/dL (ref 0.3–1.2)
BUN: 21 mg/dL (ref 6–23)
CO2: 27 meq/L (ref 19–32)
CREATININE: 1.1 mg/dL (ref 0.4–1.5)
Calcium: 9.6 mg/dL (ref 8.4–10.5)
Chloride: 103 mEq/L (ref 96–112)
GFR: 64.69 mL/min (ref >60.00)
Glucose, Bld: 96 mg/dL (ref 70–99)
Potassium: 4.3 mEq/L (ref 3.5–5.1)
Sodium: 140 mEq/L (ref 135–145)
Total Protein: 7.3 g/dL (ref 6.0–8.3)

## 2013-05-16 LAB — CBC WITH DIFFERENTIAL/PLATELET
BASOS PCT: 1.4 % (ref 0.0–3.0)
Basophils Absolute: 0.1 10*3/uL (ref 0.0–0.1)
Eosinophils Absolute: 0.5 10*3/uL (ref 0.0–0.7)
Eosinophils Relative: 4.9 % (ref 0.0–5.0)
HCT: 42.7 % (ref 39.0–52.0)
Hemoglobin: 14.3 g/dL (ref 13.0–17.0)
Lymphocytes Relative: 21.9 % (ref 12.0–46.0)
Lymphs Abs: 2.1 10*3/uL (ref 0.7–4.0)
MCHC: 33.4 g/dL (ref 30.0–36.0)
MCV: 91.2 fl (ref 78.0–100.0)
MONO ABS: 0.7 10*3/uL (ref 0.1–1.0)
Monocytes Relative: 6.9 % (ref 3.0–12.0)
NEUTROS PCT: 64.9 % (ref 43.0–77.0)
Neutro Abs: 6.2 10*3/uL (ref 1.4–7.7)
Platelets: 274 10*3/uL (ref 150.0–400.0)
RBC: 4.68 Mil/uL (ref 4.22–5.81)
RDW: 14.1 % (ref 11.5–14.6)
WBC: 9.6 10*3/uL (ref 4.5–10.5)

## 2013-05-16 MED ORDER — FUROSEMIDE 20 MG PO TABS: 20.0000 mg | ORAL_TABLET | Freq: Every day | ORAL | Status: DC

## 2013-05-16 NOTE — Progress Notes (Signed)
Pre-visit discussion using our clinic review tool. No additional management support is needed unless otherwise documented below in the visit note.  

## 2013-05-16 NOTE — Patient Instructions (Signed)
Please send for recent discharge summary from [facility] today  Take lasix fri / sat and Sunday- then call to let me know Monday how your swelling is  I'm glad you are feeling better  Follow up with your surgeon as planned

## 2013-05-16 NOTE — Progress Notes (Signed)
Subjective:    Patient ID: Matthew [name], male    DOB: April 10, 1925, 80 y.o.   MRN: [id number]  HPI Here for f/u of hosp - at [facility] for cholecystitis  He had ccy and cholangiography   Is feeling better   Went in on 12/ 24 -- and d/c 12/31 Had abd pain - for 2 d and got really bad , did vomit /never ran a fever  Had surgery 09/23  No complications with the surgery- he did great   Before he went in - LFTs were up  Right now has swelling of ankles  Px of lasix ran out  Has flomax to help urination  PT also gave exericses to do also   Still tired   Has f/u with surgeon Matthew [name] - this coming Wednesday   Still has some of his bandages on   Patient Active Problem List   Diagnosis Date Noted  . Hx laparoscopic cholecystectomy 05/16/2013  . Acute bronchitis 02/07/2013  . Prostate cancer screening 06/04/2011  . CONSTIPATION, CHRONIC 01/12/2010  . Nonspecific (abnormal) findings on radiological and other examination of body structure 10/07/2007  . ALLERGIC CONJUNCTIVITIS 10/04/2007  . EDEMA 10/04/2007  . HYPERCHOLESTEROLEMIA 08/10/2006  . RESTLESS LEG SYNDROME 08/10/2006  . HYPERTENSION 08/10/2006  . ALLERGIC RHINITIS 08/10/2006  . RENAL INSUFFICIENCY 08/10/2006  . SKIN CANCER, HX OF 08/10/2006   Past Medical History  Diagnosis Date  . Allergic rhinitis   . History of transfusion of whole blood   . Melanoma     history of, by right eye  . Hypertension   . Renal insufficiency   . Chronic kidney disease     stage 3, [place] benign appearing cysts 05/2006  . Pulmonary nodules     being obs with serial CT, 6 month f/u stable 10/05  . Chronic constipation   . History of CT scan of chest 6/09    calcification consistent with asbestos exp, also pleural nodule, incidental gallstones   Past Surgical History  Procedure Laterality Date  . Ganglion cyst excision      hands  . 2d echo  6/09    slight thickening of aortic valve with EF of 60%   History  Substance Use Topics  .  Smoking status: Former Research scientist (life sciences)  . Smokeless tobacco: Not on file     Comment: Quit smoking in 1957.  Marland Kitchen Alcohol Use: No   Family History  Problem Relation Age of Onset  . Hypertension Mother   . Hypertension Father   . Stroke Father   . Cancer Brother     pancreatic   Allergies  Allergen Reactions  . Chlorthalidone     REACTION: frequent urination  . Codeine     REACTION: nausea  . Iodine     REACTION: caused bubble to come up in eye  . Penicillins     REACTION: GI  . Ramipril     REACTION: cough  . Ropinirole Hydrochloride     REACTION: insomnia   Current Outpatient Prescriptions on File Prior to Visit  Medication Sig Dispense Refill  . amLODipine (NORVASC) 10 MG tablet Take 1 tablet (10 mg total) by mouth daily.  90 tablet  3  . aspirin 81 MG tablet Take 81 mg by mouth daily.        Marland Kitchen atorvastatin (LIPITOR) 40 MG tablet Take 1 tablet (40 mg total) by mouth daily.  90 tablet  3  . benzonatate (TESSALON) 200 MG capsule Take 1 capsule (200  mg total) by mouth 3 (three) times daily as needed for cough (swallow whole (do not bite the pill)).  30 capsule  0  . Cholecalciferol (VITAMIN D) 1000 UNITS capsule Take 1,000 Units by mouth daily.        . Omega-3 Fatty Acids (FISH OIL) 1200 MG CAPS Take by mouth daily.        . polyethylene glycol powder (MIRALAX) powder Take otc as directed        No current facility-administered medications on file prior to visit.    Review of Systems Review of Systems  Constitutional: Negative for fever, appetite change, fatigue and unexpected weight change.  Eyes: Negative for pain and visual disturbance.  Respiratory: Negative for cough and shortness of breath.  pos for pedal edema  Cardiovascular: Negative for cp or palpitations    Gastrointestinal: Negative for nausea, diarrhea and constipation. neg for abd pain  Genitourinary: Negative for urgency and frequency.  Skin: Negative for pallor or rash   Neurological: Negative for weakness,  light-headedness, numbness and headaches.  Hematological: Negative for adenopathy. Does not bruise/bleed easily.  Psychiatric/Behavioral: Negative for dysphoric mood. The patient is not nervous/anxious.         Objective:   Physical Exam  Constitutional: He appears well-developed and well-nourished. No distress.  HENT:  Head: Normocephalic and atraumatic.  Mouth/Throat: Oropharynx is clear and moist.  Eyes: Conjunctivae and EOM are normal. Pupils are equal, round, and reactive to light. Right eye exhibits no discharge. Left eye exhibits no discharge.  Neck: Normal range of motion. Neck supple. No JVD present. No thyromegaly present.  Cardiovascular: Normal rate, regular rhythm and intact distal pulses.  Exam reveals no gallop.   Pulmonary/Chest: Effort normal and breath sounds normal. No respiratory distress. He has no wheezes. He has no rales.  No crackles   Abdominal: Soft. Bowel sounds are normal. He exhibits no distension and no mass. There is no tenderness. There is no rebound and no guarding.  4 small laparascopy incisions - with clear bandages- small amt of bruising/no redness or tenderness  Musculoskeletal: He exhibits edema.  1 plus edema pedal bilat   Lymphadenopathy:    He has no cervical adenopathy.  Neurological: He is alert. No cranial nerve deficit. He exhibits normal muscle tone. Coordination normal.  Skin: Skin is warm and dry. No rash noted. No erythema. No pallor.  Psychiatric: He has a normal mood and affect.          Assessment & Plan:

## 2013-05-18 NOTE — Assessment & Plan Note (Signed)
This returned after hosp- likely due to fluids he received there  Will take lasix daily fri/ sat/sun and call mon with a report  If he needs this any longer- we will need to monitor his K

## 2013-05-18 NOTE — Assessment & Plan Note (Addendum)
Pt has recovered well -no longer has pain  F/u with surgeon upcoming for re check hosp notes/ labs and studies rev with pt in detail

## 2013-05-19 ENCOUNTER — Telehealth: Payer: Self-pay | Admitting: *Deleted

## 2013-05-19 NOTE — Telephone Encounter (Signed)
Pt notified and f/u appt scheduled for 05/23/12 at 9:00am

## 2013-05-19 NOTE — Telephone Encounter (Signed)
You advise pt at his appt. on Friday to take lasix on Friday-Sunday and update [place] on Monday how his sxs are doing.  Pt called and said that he can see a slight improvement but not much. So he wanted to know what should he do and should he keep taking the lasix, please advise

## 2013-05-19 NOTE — Telephone Encounter (Signed)
Keep taking it and then f/u with me Friday for visit and we will check K that day as well  Avoid salt in diet  thanks

## 2013-05-23 ENCOUNTER — Ambulatory Visit (INDEPENDENT_AMBULATORY_CARE_PROVIDER_SITE_OTHER): Payer: Medicare HMO | Admitting: Family Medicine

## 2013-05-23 ENCOUNTER — Encounter: Payer: Self-pay | Admitting: Family Medicine

## 2013-05-23 VITALS — BP 130/68 | HR 76 | Temp 97.3°F | Ht 67.0 in | Wt 155.0 lb

## 2013-05-23 DIAGNOSIS — R609 Edema, unspecified: Secondary | ICD-10-CM

## 2013-05-23 DIAGNOSIS — I10 Essential (primary) hypertension: Secondary | ICD-10-CM

## 2013-05-23 LAB — RENAL FUNCTION PANEL
ALBUMIN: 3.8 g/dL (ref 3.5–5.2)
BUN: 21 mg/dL (ref 6–23)
CO2: 28 mEq/L (ref 19–32)
Calcium: 9.7 mg/dL (ref 8.4–10.5)
Chloride: 104 mEq/L (ref 96–112)
Creatinine, Ser: 1.2 mg/dL (ref 0.4–1.5)
GFR: 60.39 mL/min (ref >60.00)
GLUCOSE: 95 mg/dL (ref 70–99)
PHOSPHORUS: 3.6 mg/dL (ref 2.3–4.6)
Potassium: 4.2 mEq/L (ref 3.5–5.1)
SODIUM: 140 meq/L (ref 135–145)

## 2013-05-23 NOTE — Assessment & Plan Note (Addendum)
This is slowly imp with lasix 20  Still trying to diurese fluid from hosp/ surg HTN stable  Renal fx -monitor Lab today -renal panel- advise May need another week or two of lasix -pt is not able to tolerate more than this (frequent urination)

## 2013-05-23 NOTE — Assessment & Plan Note (Signed)
Stable bp with addn of lasix  Continue to watch  Hopefully lasix will still be temporary for post hosp edema

## 2013-05-23 NOTE — Progress Notes (Signed)
Subjective:    Patient ID: Matthew [name], male    DOB: 05-23-1924, 79 y.o.   MRN: [id number]  HPI Here for f/u of pedal edema - after his ccy/ hosp   Taking lasix 20 mg daily Some improvement but not back to normal  Feeling fairly good   Needs bmp today No cramping  Patient Active Problem List   Diagnosis Date Noted  . Hx laparoscopic cholecystectomy 05/16/2013  . Acute bronchitis 02/07/2013  . Prostate cancer screening 06/04/2011  . CONSTIPATION, CHRONIC 01/12/2010  . Nonspecific (abnormal) findings on radiological and other examination of body structure 10/07/2007  . ALLERGIC CONJUNCTIVITIS 10/04/2007  . EDEMA 10/04/2007  . HYPERCHOLESTEROLEMIA 08/10/2006  . RESTLESS LEG SYNDROME 08/10/2006  . HYPERTENSION 08/10/2006  . ALLERGIC RHINITIS 08/10/2006  . RENAL INSUFFICIENCY 08/10/2006  . SKIN CANCER, HX OF 08/10/2006   Past Medical History  Diagnosis Date  . Allergic rhinitis   . History of transfusion of whole blood   . Melanoma     history of, by right eye  . Hypertension   . Renal insufficiency   . Chronic kidney disease     stage 3, [place] benign appearing cysts 05/2006  . Pulmonary nodules     being obs with serial CT, 6 month f/u stable 10/05  . Chronic constipation   . History of CT scan of chest 6/09    calcification consistent with asbestos exp, also pleural nodule, incidental gallstones   Past Surgical History  Procedure Laterality Date  . Ganglion cyst excision      hands  . 2d echo  6/09    slight thickening of aortic valve with EF of 60%   History  Substance Use Topics  . Smoking status: Former Research scientist (life sciences)  . Smokeless tobacco: Not on file     Comment: Quit smoking in 1957.  Marland Kitchen Alcohol Use: No   Family History  Problem Relation Age of Onset  . Hypertension Mother   . Hypertension Father   . Stroke Father   . Cancer Brother     pancreatic   Allergies  Allergen Reactions  . Chlorthalidone     REACTION: frequent urination  . Codeine    REACTION: nausea  . Iodine     REACTION: caused bubble to come up in eye  . Penicillins     REACTION: GI  . Ramipril     REACTION: cough  . Ropinirole Hydrochloride     REACTION: insomnia   Current Outpatient Prescriptions on File Prior to Visit  Medication Sig Dispense Refill  . amLODipine (NORVASC) 10 MG tablet Take 1 tablet (10 mg total) by mouth daily.  90 tablet  3  . aspirin 81 MG tablet Take 81 mg by mouth daily.        Marland Kitchen atorvastatin (LIPITOR) 40 MG tablet Take 1 tablet (40 mg total) by mouth daily.  90 tablet  3  . benzonatate (TESSALON) 200 MG capsule Take 1 capsule (200 mg total) by mouth 3 (three) times daily as needed for cough (swallow whole (do not bite the pill)).  30 capsule  0  . Cholecalciferol (VITAMIN D) 1000 UNITS capsule Take 1,000 Units by mouth daily.        . furosemide (LASIX) 20 MG tablet Take 1 tablet (20 mg total) by mouth daily. Take one by mouth once daily as needed for swelling  30 tablet  0  . Omega-3 Fatty Acids (FISH OIL) 1200 MG CAPS Take by mouth daily.        Marland Kitchen  polyethylene glycol powder (MIRALAX) powder Take otc as directed       . tamsulosin (FLOMAX) 0.4 MG CAPS capsule Take 0.4 mg by mouth daily.       No current facility-administered medications on file prior to visit.      Review of Systems Review of Systems  Constitutional: Negative for fever, appetite change, fatigue and unexpected weight change.  Eyes: Negative for pain and visual disturbance.  Respiratory: Negative for cough and shortness of breath.  pos for pedal edema  Cardiovascular: Negative for cp or palpitations    Gastrointestinal: Negative for nausea, diarrhea and constipation.  Genitourinary: Negative for urgency and frequency.  Skin: Negative for pallor or rash   Neurological: Negative for weakness, light-headedness, numbness and headaches.  Hematological: Negative for adenopathy. Does not bruise/bleed easily.  Psychiatric/Behavioral: Negative for dysphoric mood. The  patient is not nervous/anxious.         Objective:   Physical Exam  Constitutional: He appears well-developed and well-nourished. No distress.  Frail app elderly male in no distress   HENT:  Head: Normocephalic and atraumatic.  Mouth/Throat: Oropharynx is clear and moist.  Eyes: Conjunctivae and EOM are normal. Pupils are equal, round, and reactive to light. No scleral icterus.  Neck: Normal range of motion. Neck supple. No JVD present. Carotid bruit is not present.  Cardiovascular: Normal rate, regular rhythm and intact distal pulses.  Exam reveals no gallop.   Pulmonary/Chest: Effort normal and breath sounds normal. No respiratory distress. He has no wheezes.  No crackles   Musculoskeletal: Normal range of motion. He exhibits edema.  One plus edema- soft and improved from last time  Pitting noted   Lymphadenopathy:    He has no cervical adenopathy.  Neurological: He is alert.  Skin: Skin is warm and dry. No erythema.  Psychiatric: He has a normal mood and affect.          Assessment & Plan:

## 2013-05-23 NOTE — Patient Instructions (Signed)
Continue lasix 20 mg daily  Labs today  We will advise you from there  Update me next week as to how your swelling is

## 2013-05-23 NOTE — Progress Notes (Signed)
Pre-visit discussion using our clinic review tool. No additional management support is needed unless otherwise documented below in the visit note.  

## 2013-05-27 ENCOUNTER — Telehealth: Payer: Self-pay | Admitting: *Deleted

## 2013-05-27 NOTE — Telephone Encounter (Signed)
Great-he can stop the lasix then, but let me know if swelling comes back  thanks

## 2013-05-27 NOTE — Telephone Encounter (Signed)
Patient saw you on Thursday and was asked to call back with an update.  He says to let you know the swelling is much better.

## 2013-05-27 NOTE — Telephone Encounter (Signed)
Spoke with patient and advised results   

## 2013-06-04 ENCOUNTER — Telehealth: Payer: Self-pay | Admitting: Family Medicine

## 2013-06-04 NOTE — Telephone Encounter (Signed)
Relevant patient education mailed to patient.  

## 2013-06-15 ENCOUNTER — Telehealth: Payer: Self-pay | Admitting: Family Medicine

## 2013-06-15 DIAGNOSIS — N259 Disorder resulting from impaired renal tubular function, unspecified: Secondary | ICD-10-CM

## 2013-06-15 DIAGNOSIS — I10 Essential (primary) hypertension: Secondary | ICD-10-CM

## 2013-06-15 DIAGNOSIS — E7800 Pure hypercholesterolemia, unspecified: Secondary | ICD-10-CM

## 2013-06-15 NOTE — Telephone Encounter (Signed)
Message copied by Abner Greenspan on Sun Jun 15, 2013  1:20 PM ------      Message from: Ellamae Sia      Created: Thu Jun 12, 2013 10:58 AM      Regarding: Lab orders for Monday, 2.16.15       Patient is scheduled for CPX labs, please order future labs, Thanks , Terri       ------

## 2013-06-16 ENCOUNTER — Other Ambulatory Visit (INDEPENDENT_AMBULATORY_CARE_PROVIDER_SITE_OTHER): Payer: Medicare HMO

## 2013-06-16 DIAGNOSIS — I10 Essential (primary) hypertension: Secondary | ICD-10-CM

## 2013-06-16 DIAGNOSIS — E7800 Pure hypercholesterolemia, unspecified: Secondary | ICD-10-CM

## 2013-06-16 DIAGNOSIS — N259 Disorder resulting from impaired renal tubular function, unspecified: Secondary | ICD-10-CM

## 2013-06-16 LAB — CBC WITH DIFFERENTIAL/PLATELET
BASOS ABS: 0 10*3/uL (ref 0.0–0.1)
BASOS PCT: 0.4 % (ref 0.0–3.0)
EOS ABS: 0.4 10*3/uL (ref 0.0–0.7)
Eosinophils Relative: 5.1 % — ABNORMAL HIGH (ref 0.0–5.0)
HCT: 45.6 % (ref 39.0–52.0)
Hemoglobin: 14.9 g/dL (ref 13.0–17.0)
LYMPHS PCT: 26.5 % (ref 12.0–46.0)
Lymphs Abs: 2.1 10*3/uL (ref 0.7–4.0)
MCHC: 32.7 g/dL (ref 30.0–36.0)
MCV: 93.8 fl (ref 78.0–100.0)
MONOS PCT: 6.2 % (ref 3.0–12.0)
Monocytes Absolute: 0.5 10*3/uL (ref 0.1–1.0)
NEUTROS PCT: 61.8 % (ref 43.0–77.0)
Neutro Abs: 4.8 10*3/uL (ref 1.4–7.7)
Platelets: 221 10*3/uL (ref 150.0–400.0)
RBC: 4.87 Mil/uL (ref 4.22–5.81)
RDW: 13.9 % (ref 11.5–14.6)
WBC: 7.8 10*3/uL (ref 4.5–10.5)

## 2013-06-16 LAB — COMPREHENSIVE METABOLIC PANEL
ALBUMIN: 3.9 g/dL (ref 3.5–5.2)
ALT: 26 U/L (ref 0–53)
AST: 19 U/L (ref 0–37)
Alkaline Phosphatase: 82 U/L (ref 39–117)
BUN: 20 mg/dL (ref 6–23)
CALCIUM: 9.5 mg/dL (ref 8.4–10.5)
CHLORIDE: 106 meq/L (ref 96–112)
CO2: 26 meq/L (ref 19–32)
Creatinine, Ser: 1.2 mg/dL (ref 0.4–1.5)
GFR: 61.56 mL/min (ref >60.00)
Glucose, Bld: 86 mg/dL (ref 70–99)
POTASSIUM: 3.9 meq/L (ref 3.5–5.1)
Sodium: 141 mEq/L (ref 135–145)
Total Bilirubin: 0.9 mg/dL (ref 0.3–1.2)
Total Protein: 7.2 g/dL (ref 6.0–8.3)

## 2013-06-16 LAB — LIPID PANEL
CHOL/HDL RATIO: 3
Cholesterol: 153 mg/dL (ref 0–200)
HDL: 45.6 mg/dL (ref >39.00)
LDL Cholesterol: 78 mg/dL (ref 0–99)
Triglycerides: 147 mg/dL (ref 0.0–149.0)
VLDL: 29.4 mg/dL (ref 0.0–40.0)

## 2013-06-16 LAB — TSH: TSH: 2.84 u[IU]/mL (ref 0.35–5.50)

## 2013-06-20 ENCOUNTER — Encounter: Payer: Medicare Other | Admitting: Family Medicine

## 2013-06-27 ENCOUNTER — Encounter: Payer: Medicare HMO | Admitting: Family Medicine

## 2013-07-09 ENCOUNTER — Encounter: Payer: Self-pay | Admitting: Family Medicine

## 2013-07-09 ENCOUNTER — Ambulatory Visit (INDEPENDENT_AMBULATORY_CARE_PROVIDER_SITE_OTHER): Payer: Medicare HMO | Admitting: Family Medicine

## 2013-07-09 VITALS — BP 124/82 | HR 78 | Temp 97.6°F | Ht 67.0 in | Wt 157.8 lb

## 2013-07-09 DIAGNOSIS — Z0000 Encounter for general adult medical examination without abnormal findings: Secondary | ICD-10-CM

## 2013-07-09 DIAGNOSIS — R5381 Other malaise: Secondary | ICD-10-CM

## 2013-07-09 DIAGNOSIS — R5383 Other fatigue: Secondary | ICD-10-CM

## 2013-07-09 DIAGNOSIS — I10 Essential (primary) hypertension: Secondary | ICD-10-CM

## 2013-07-09 DIAGNOSIS — E7800 Pure hypercholesterolemia, unspecified: Secondary | ICD-10-CM

## 2013-07-09 DIAGNOSIS — N259 Disorder resulting from impaired renal tubular function, unspecified: Secondary | ICD-10-CM

## 2013-07-09 MED ORDER — FUROSEMIDE 20 MG PO TABS: 20.0000 mg | ORAL_TABLET | Freq: Every day | ORAL | Status: DC

## 2013-07-09 NOTE — Assessment & Plan Note (Signed)
Likely multifactorial  Age/ recent illness/ recent surgery/ stressors disc  Rev labs Will update if new sympt or no imp

## 2013-07-09 NOTE — Assessment & Plan Note (Signed)
Reviewed health habits including diet and exercise and skin cancer prevention Reviewed appropriate screening tests for age  Also reviewed health mt list, fam hx and immunization status , as well as social and family history   See HPI Labs reviewed  

## 2013-07-09 NOTE — Progress Notes (Signed)
Pre visit review using our clinic review tool, if applicable. No additional management support is needed unless otherwise documented below in the visit note. 

## 2013-07-09 NOTE — Assessment & Plan Note (Signed)
Disc goals for lipids and reasons to control them Rev labs with pt Rev low sat fat diet in detail Will continue statin and diet  

## 2013-07-09 NOTE — Progress Notes (Signed)
Subjective:    Patient ID: Matthew [name], male    DOB: 08-Oct-1924, 80 y.o.   MRN: [id number]  HPI I have personally reviewed the Medicare Annual Wellness questionnaire and have noted 1. The patient's medical and social history 2. Their use of alcohol, tobacco or illicit drugs 3. Their current medications and supplements 4. The patient's functional ability including ADL's, fall risks, home safety risks and hearing or visual             impairment. 5. Diet and physical activities 6. Evidence for depression or mood disorders  The patients weight, height, BMI have been recorded in the chart and visual acuity is per eye clinic.  I have made referrals, counseling and provided education to the patient based review of the above and I have provided the pt with a written personalized care plan for preventive services.  Doing about the same  Really tired -this persists  Sleeping better overall - he uses tylenol pm occasionally  Sometimes he naps during the day  Thinks the surgery wiped him out in general- trying to get his energy level back  Vital signs all look ok   See scanned forms.  Routine anticipatory guidance given to patient.  See health maintenance.   Wt is up 1-2 lbs with bmi 24  He is eating healthy diet  Takes boost in am due to dec appetite in the am   Flu vaccine -had it in the fall  Tetanus vaccine 2/14 Pneumovax 1/09  Zoster vaccine 4/13 Colonoscopy 11/11 - no recall due to age / no symptoms  Advance directive -he has a living will set up  Cognitive function addressed- see scanned forms- and if abnormal then additional documentation follows. -no major issues with that - short term recall takes a bit longer than it used to   [place] and SH reviewed  Meds, vitals, and allergies reviewed.   ROS: See HPI.  Otherwise negative.   bp is stable today  No cp or palpitations or headaches or edema  No side effects to medicines  BP Readings from Last 3 Encounters:  07/09/13  124/82  05/23/13 130/68  05/16/13 132/84     Pedal edema-improving  On lasix-when he needs it   Hyperlipidemia On statin and diet Lab Results  Component Value Date   CHOL 153 06/16/2013   CHOL 140 12/17/2012   CHOL 168 06/05/2012   Lab Results  Component Value Date   HDL 45.60 06/16/2013   HDL 42.30 12/17/2012   HDL 51.10 06/05/2012   Lab Results  Component Value Date   LDLCALC 78 06/16/2013   LDLCALC 85 12/17/2012   LDLCALC 99 06/05/2012   Lab Results  Component Value Date   TRIG 147.0 06/16/2013   TRIG 62.0 12/17/2012   TRIG 90.0 06/05/2012   Lab Results  Component Value Date   CHOLHDL 3 06/16/2013   CHOLHDL 3 12/17/2012   CHOLHDL 3 06/05/2012   No results found for this basename: LDLDIRECT  that is in good control    Hx of renal insuff   Chemistry      Component Value Date/Time   NA 141 06/16/2013 0749   K 3.9 06/16/2013 0749   CL 106 06/16/2013 0749   CO2 26 06/16/2013 0749   BUN 20 06/16/2013 0749   CREATININE 1.2 06/16/2013 0749      Component Value Date/Time   CALCIUM 9.5 06/16/2013 0749   ALKPHOS 82 06/16/2013 0749   AST 19 06/16/2013 0749  ALT 26 06/16/2013 0749   BILITOT 0.9 06/16/2013 0749     this is stable  He is drinking more water and eating less salt    Lab Results  Component Value Date   WBC 7.8 06/16/2013   HGB 14.9 06/16/2013   HCT 45.6 06/16/2013   MCV 93.8 06/16/2013   PLT 221.0 06/16/2013   Lab Results  Component Value Date   TSH 2.84 06/16/2013     No prostate or urinary problems   Patient Active Problem List   Diagnosis Date Noted  . Encounter for Medicare annual wellness exam 07/09/2013  . Hx laparoscopic cholecystectomy 05/16/2013  . Prostate cancer screening 06/04/2011  . CONSTIPATION, CHRONIC 01/12/2010  . Nonspecific (abnormal) findings on radiological and other examination of body structure 10/07/2007  . ALLERGIC CONJUNCTIVITIS 10/04/2007  . EDEMA 10/04/2007  . HYPERCHOLESTEROLEMIA 08/10/2006  . RESTLESS LEG SYNDROME 08/10/2006  .  HYPERTENSION 08/10/2006  . ALLERGIC RHINITIS 08/10/2006  . RENAL INSUFFICIENCY 08/10/2006  . SKIN CANCER, HX OF 08/10/2006   Past Medical History  Diagnosis Date  . Allergic rhinitis   . History of transfusion of whole blood   . Melanoma     history of, by right eye  . Hypertension   . Renal insufficiency   . Chronic kidney disease     stage 3, [place] benign appearing cysts 05/2006  . Pulmonary nodules     being obs with serial CT, 6 month f/u stable 10/05  . Chronic constipation   . History of CT scan of chest 6/09    calcification consistent with asbestos exp, also pleural nodule, incidental gallstones   Past Surgical History  Procedure Laterality Date  . Ganglion cyst excision      hands  . 2d echo  6/09    slight thickening of aortic valve with EF of 60%   History  Substance Use Topics  . Smoking status: Former Research scientist (life sciences)  . Smokeless tobacco: Not on file     Comment: Quit smoking in 1957.  Marland Kitchen Alcohol Use: No   Family History  Problem Relation Age of Onset  . Hypertension Mother   . Hypertension Father   . Stroke Father   . Cancer Brother     pancreatic   Allergies  Allergen Reactions  . Chlorthalidone     REACTION: frequent urination  . Codeine     REACTION: nausea  . Iodine     REACTION: caused bubble to come up in eye  . Penicillins     REACTION: GI  . Ramipril     REACTION: cough  . Ropinirole Hydrochloride     REACTION: insomnia   Current Outpatient Prescriptions on File Prior to Visit  Medication Sig Dispense Refill  . amLODipine (NORVASC) 10 MG tablet Take 1 tablet (10 mg total) by mouth daily.  90 tablet  3  . aspirin 81 MG tablet Take 81 mg by mouth daily.        Marland Kitchen atorvastatin (LIPITOR) 40 MG tablet Take 1 tablet (40 mg total) by mouth daily.  90 tablet  3  . Cholecalciferol (VITAMIN D) 1000 UNITS capsule Take 1,000 Units by mouth daily.        . furosemide (LASIX) 20 MG tablet Take 1 tablet (20 mg total) by mouth daily. Take one by mouth once daily  as needed for swelling  30 tablet  0  . Omega-3 Fatty Acids (FISH OIL) 1200 MG CAPS Take by mouth daily.        Marland Kitchen  polyethylene glycol powder (MIRALAX) powder Take otc as directed        No current facility-administered medications on file prior to visit.   Review of Systems Review of Systems  Constitutional: Negative for fever, appetite change, and unexpected weight change.  Eyes: Negative for pain and visual disturbance.  Respiratory: Negative for cough and shortness of breath.   Cardiovascular: Negative for cp or palpitations    Gastrointestinal: Negative for nausea, diarrhea and constipation.  Genitourinary: Negative for urgency and frequency.  Skin: Negative for pallor or rash   Neurological: Negative for weakness, light-headedness, numbness and headaches.  Hematological: Negative for adenopathy. Does not bruise/bleed easily.  Psychiatric/Behavioral: Negative for dysphoric mood. The patient is not nervous/anxious.         Objective:   Physical Exam  Constitutional: He is oriented to person, place, and time. He appears well-developed and well-nourished. No distress.  HENT:  Head: Normocephalic and atraumatic.  Right Ear: External ear normal.  Left Ear: External ear normal.  Nose: Nose normal.  Mouth/Throat: Oropharynx is clear and moist.  Eyes: Conjunctivae and EOM are normal. Pupils are equal, round, and reactive to light. Right eye exhibits no discharge. Left eye exhibits no discharge. No scleral icterus.  Neck: Normal range of motion. Neck supple. No JVD present. Carotid bruit is not present. No thyromegaly present.  Cardiovascular: Normal rate, regular rhythm, normal heart sounds and intact distal pulses.  Exam reveals no gallop.   Pulmonary/Chest: Effort normal and breath sounds normal. No respiratory distress. He has no wheezes. He has no rales.  No crackles   Abdominal: Soft. Bowel sounds are normal. He exhibits no distension, no abdominal bruit and no mass. There is no  tenderness.  Musculoskeletal: Normal range of motion. He exhibits edema. He exhibits no tenderness.  One plus pedal edema that is pitting  Lymphadenopathy:    He has no cervical adenopathy.  Neurological: He is alert and oriented to person, place, and time. He has normal reflexes. No cranial nerve deficit. He exhibits normal muscle tone. Coordination normal.  Skin: Skin is warm and dry. No rash noted. No erythema. No pallor.  Solar lentigos diffusely  SKs diffusely  Few seb cysts on back   Psychiatric: He has a normal mood and affect.          Assessment & Plan:

## 2013-07-09 NOTE — Patient Instructions (Signed)
Gradually increase your exercise and activity as tolerated If any new symptoms let me know Try to eat a healthy diet  Avoid falls -- do not walk in stocking feet - wear some shoes in the house   Fall Prevention and Home Safety Falls cause injuries and can affect all age groups. It is possible to use preventive measures to significantly decrease the likelihood of falls. There are many simple measures which can make your home safer and prevent falls. OUTDOORS  Repair cracks and edges of walkways and driveways.  Remove high doorway thresholds.  Trim shrubbery on the main path into your home.  Have good outside lighting.  Clear walkways of tools, rocks, debris, and clutter.  Check that handrails are not broken and are securely fastened. Both sides of steps should have handrails.  Have leaves, snow, and ice cleared regularly.  Use sand or salt on walkways during winter months.  In the garage, clean up grease or oil spills. BATHROOM  Install night lights.  Install grab bars by the toilet and in the tub and shower.  Use non-skid mats or decals in the tub or shower.  Place a plastic non-slip stool in the shower to sit on, if needed.  Keep floors dry and clean up all water on the floor immediately.  Remove soap buildup in the tub or shower on a regular basis.  Secure bath mats with non-slip, double-sided rug tape.  Remove throw rugs and tripping hazards from the floors. BEDROOMS  Install night lights.  Make sure a bedside light is easy to reach.  Do not use oversized bedding.  Keep a telephone by your bedside.  Have a firm chair with side arms to use for getting dressed.  Remove throw rugs and tripping hazards from the floor. KITCHEN  Keep handles on pots and pans turned toward the center of the stove. Use back burners when possible.  Clean up spills quickly and allow time for drying.  Avoid walking on wet floors.  Avoid hot utensils and knives.  Position  shelves so they are not too high or low.  Place commonly used objects within easy reach.  If necessary, use a sturdy step stool with a grab bar when reaching.  Keep electrical cables out of the way.  Do not use floor polish or wax that makes floors slippery. If you must use wax, use non-skid floor wax.  Remove throw rugs and tripping hazards from the floor. STAIRWAYS  Never leave objects on stairs.  Place handrails on both sides of stairways and use them. Fix any loose handrails. Make sure handrails on both sides of the stairways are as long as the stairs.  Check carpeting to make sure it is firmly attached along stairs. Make repairs to worn or loose carpet promptly.  Avoid placing throw rugs at the top or bottom of stairways, or properly secure the rug with carpet tape to prevent slippage. Get rid of throw rugs, if possible.  Have an electrician put in a light switch at the top and bottom of the stairs. OTHER FALL PREVENTION TIPS  Wear low-heel or rubber-soled shoes that are supportive and fit well. Wear closed toe shoes.  When using a stepladder, make sure it is fully opened and both spreaders are firmly locked. Do not climb a closed stepladder.  Add color or contrast paint or tape to grab bars and handrails in your home. Place contrasting color strips on first and last steps.  Learn and use mobility aids as  needed. Install an electrical emergency response system.  Turn on lights to avoid dark areas. Replace light bulbs that burn out immediately. Get light switches that glow.  Arrange furniture to create clear pathways. Keep furniture in the same place.  Firmly attach carpet with non-skid or double-sided tape.  Eliminate uneven floor surfaces.  Select a carpet pattern that does not visually hide the edge of steps.  Be aware of all pets. OTHER HOME SAFETY TIPS  Set the water temperature for 120 F (48.8 C).  Keep emergency numbers on or near the telephone.  Keep  smoke detectors on every level of the home and near sleeping areas. Document Released: 04/07/2002 Document Revised: 10/17/2011 Document Reviewed: 07/07/2011 [facility] Patient Information 2014 [place].

## 2013-07-09 NOTE — Assessment & Plan Note (Signed)
bp in fair control at this time  BP Readings from Last 1 Encounters:  07/09/13 124/82   No changes needed Disc lifstyle change with low sodium diet and exercise  Labs rev

## 2013-07-09 NOTE — Assessment & Plan Note (Signed)
Chem panel is stable today No c/o Is working on water intake

## 2013-12-25 ENCOUNTER — Other Ambulatory Visit: Payer: Self-pay | Admitting: *Deleted

## 2013-12-25 MED ORDER — ATORVASTATIN CALCIUM 40 MG PO TABS: 40.0000 mg | ORAL_TABLET | Freq: Every day | ORAL | Status: DC

## 2013-12-25 MED ORDER — AMLODIPINE BESYLATE 10 MG PO TABS: 10.0000 mg | ORAL_TABLET | Freq: Every day | ORAL | Status: DC

## 2013-12-25 NOTE — Addendum Note (Signed)
Addended by: Tammi Sou on: 12/25/2013 09:02 AM   Modules accepted: Orders

## 2014-03-22 ENCOUNTER — Other Ambulatory Visit: Payer: Self-pay | Admitting: Family Medicine

## 2014-06-18 ENCOUNTER — Other Ambulatory Visit: Payer: Self-pay | Admitting: Family Medicine

## 2014-07-14 ENCOUNTER — Other Ambulatory Visit: Payer: Self-pay | Admitting: Family Medicine

## 2014-07-23 ENCOUNTER — Other Ambulatory Visit: Payer: Self-pay | Admitting: Family Medicine

## 2014-08-10 ENCOUNTER — Ambulatory Visit (INDEPENDENT_AMBULATORY_CARE_PROVIDER_SITE_OTHER): Payer: Medicare HMO | Admitting: Family Medicine

## 2014-08-10 ENCOUNTER — Encounter: Payer: Self-pay | Admitting: Family Medicine

## 2014-08-10 VITALS — BP 132/78 | HR 75 | Temp 97.5°F | Ht 67.0 in | Wt 150.0 lb

## 2014-08-10 DIAGNOSIS — E7800 Pure hypercholesterolemia, unspecified: Secondary | ICD-10-CM

## 2014-08-10 DIAGNOSIS — Z23 Encounter for immunization: Secondary | ICD-10-CM | POA: Diagnosis not present

## 2014-08-10 DIAGNOSIS — I10 Essential (primary) hypertension: Secondary | ICD-10-CM | POA: Diagnosis not present

## 2014-08-10 DIAGNOSIS — N289 Disorder of kidney and ureter, unspecified: Secondary | ICD-10-CM | POA: Diagnosis not present

## 2014-08-10 LAB — CBC WITH DIFFERENTIAL/PLATELET
Basophils Absolute: 0 10*3/uL (ref 0.0–0.1)
Basophils Relative: 0.4 % (ref 0.0–3.0)
EOS ABS: 0.3 10*3/uL (ref 0.0–0.7)
Eosinophils Relative: 3.4 % (ref 0.0–5.0)
HCT: 43.3 % (ref 39.0–52.0)
Hemoglobin: 14.7 g/dL (ref 13.0–17.0)
LYMPHS PCT: 29.2 % (ref 12.0–46.0)
Lymphs Abs: 2.4 10*3/uL (ref 0.7–4.0)
MCHC: 34 g/dL (ref 30.0–36.0)
MCV: 91.2 fl (ref 78.0–100.0)
MONO ABS: 0.5 10*3/uL (ref 0.1–1.0)
Monocytes Relative: 6.2 % (ref 3.0–12.0)
NEUTROS PCT: 60.8 % (ref 43.0–77.0)
Neutro Abs: 4.9 10*3/uL (ref 1.4–7.7)
Platelets: 207 10*3/uL (ref 150.0–400.0)
RBC: 4.75 Mil/uL (ref 4.22–5.81)
RDW: 12.9 % (ref 11.5–15.5)
WBC: 8.1 10*3/uL (ref 4.0–10.5)

## 2014-08-10 LAB — COMPREHENSIVE METABOLIC PANEL
ALBUMIN: 4 g/dL (ref 3.5–5.2)
ALK PHOS: 82 U/L (ref 39–117)
ALT: 21 U/L (ref 0–53)
AST: 19 U/L (ref 0–37)
BUN: 29 mg/dL — ABNORMAL HIGH (ref 6–23)
CO2: 26 mEq/L (ref 19–32)
Calcium: 9.6 mg/dL (ref 8.4–10.5)
Chloride: 105 mEq/L (ref 96–112)
Creatinine, Ser: 1.28 mg/dL (ref 0.40–1.50)
GFR: 56.44 mL/min — ABNORMAL LOW (ref >60.00)
Glucose, Bld: 95 mg/dL (ref 70–99)
Potassium: 3.9 mEq/L (ref 3.5–5.1)
Sodium: 139 mEq/L (ref 135–145)
Total Bilirubin: 0.8 mg/dL (ref 0.2–1.2)
Total Protein: 7 g/dL (ref 6.0–8.3)

## 2014-08-10 LAB — LIPID PANEL
CHOL/HDL RATIO: 3
Cholesterol: 127 mg/dL (ref 0–200)
HDL: 46.2 mg/dL (ref >39.00)
LDL CALC: 68 mg/dL (ref 0–99)
NonHDL: 80.8
TRIGLYCERIDES: 64 mg/dL (ref 0.0–149.0)
VLDL: 12.8 mg/dL (ref 0.0–40.0)

## 2014-08-10 LAB — TSH: TSH: 5.05 u[IU]/mL — AB (ref 0.35–4.50)

## 2014-08-10 MED ORDER — ATORVASTATIN CALCIUM 40 MG PO TABS
ORAL_TABLET | ORAL | Status: DC
Start: 2014-08-10 — End: 2015-08-02

## 2014-08-10 MED ORDER — AMLODIPINE BESYLATE 10 MG PO TABS
ORAL_TABLET | ORAL | Status: DC
Start: 2014-08-10 — End: 2015-08-02

## 2014-08-10 NOTE — Assessment & Plan Note (Signed)
Disc goals for lipids and reasons to control them Rev labs with pt- from last time (lipid check today) Refilled lipitor which he tolerates well  Rev low sat fat diet in detail

## 2014-08-10 NOTE — Progress Notes (Signed)
Subjective:    Patient ID: Matthew [name], male    DOB: [birth year], 81 y.o.   MRN: [id number]  HPI Here for f/u of chronic medical problems  Doing ok overall -feels fairly good  Misses his energy  "old and slow" -wish he could get more done    bp is stable today  No cp or palpitations or headaches or edema  No side effects to medicines  BP Readings from Last 3 Encounters:  08/10/14 132/78  07/09/13 124/82  05/23/13 130/68     Wt is down 7 lb with bmi of 23  His diet has changed - he is eating differently- a little healthier  Taking some boost supplements  His preferences have changes   Not as much exercise as "he should" Has a treadmill he uses  Still mows with riding lawnmower and cares for outdoor things   Due for labs Swelling is in good control  Hx of hyperlipidemia On lipitor and diet   Hx of renal insuff-watching Lab Results  Component Value Date   CREATININE 1.2 06/16/2013   BUN 20 06/16/2013   NA 141 06/16/2013   K 3.9 06/16/2013   CL 106 06/16/2013   CO2 26 06/16/2013      Patient Active Problem List   Diagnosis Date Noted  . Encounter for Medicare annual wellness exam 07/09/2013  . Fatigue 07/09/2013  . Hx laparoscopic cholecystectomy 05/16/2013  . Prostate cancer screening 06/04/2011  . CONSTIPATION, CHRONIC 01/12/2010  . Nonspecific (abnormal) findings on radiological and other examination of body structure 10/07/2007  . ALLERGIC CONJUNCTIVITIS 10/04/2007  . EDEMA 10/04/2007  . HYPERCHOLESTEROLEMIA 08/10/2006  . RESTLESS LEG SYNDROME 08/10/2006  . Essential hypertension 08/10/2006  . ALLERGIC RHINITIS 08/10/2006  . Renal insufficiency 08/10/2006  . SKIN CANCER, HX OF 08/10/2006   Past Medical History  Diagnosis Date  . Allergic rhinitis   . History of transfusion of whole blood   . Melanoma     history of, by right eye  . Hypertension   . Renal insufficiency   . Chronic kidney disease     stage 3, [place] benign appearing cysts  05/2006  . Pulmonary nodules     being obs with serial CT, 6 month f/u stable 10/05  . Chronic constipation   . History of CT scan of chest 6/09    calcification consistent with asbestos exp, also pleural nodule, incidental gallstones   Past Surgical History  Procedure Laterality Date  . Ganglion cyst excision      hands  . 2d echo  6/09    slight thickening of aortic valve with EF of 60%   History  Substance Use Topics  . Smoking status: Former Research scientist (life sciences)  . Smokeless tobacco: Not on file     Comment: Quit smoking in 1957.  Marland Kitchen Alcohol Use: No   Family History  Problem Relation Age of Onset  . Hypertension Mother   . Hypertension Father   . Stroke Father   . Cancer Brother     pancreatic   Allergies  Allergen Reactions  . Chlorthalidone     REACTION: frequent urination  . Codeine     REACTION: nausea  . Iodine     REACTION: caused bubble to come up in eye  . Penicillins     REACTION: GI  . Ramipril     REACTION: cough  . Ropinirole Hydrochloride     REACTION: insomnia   Current Outpatient Prescriptions on File Prior to Visit  Medication Sig Dispense Refill  . amLODipine (NORVASC) 10 MG tablet TAKE 1 TABLET (10 MG TOTAL) BY MOUTH DAILY. NEEDS TO SCHEDULE PHYSICAL FOR ANY ADDITIONAL REFILLS. 30 tablet 0  . aspirin 81 MG tablet Take 81 mg by mouth daily.      Marland Kitchen atorvastatin (LIPITOR) 40 MG tablet ONE TABLET BY MOUTH EVERY DAY. NEEDS TO SCHEDULE PHYSICAL FOR ANY ADDITIONAL REFILLS. 30 tablet 0  . Cholecalciferol (VITAMIN D) 1000 UNITS capsule Take 1,000 Units by mouth daily.      . furosemide (LASIX) 20 MG tablet Take 1 tablet (20 mg total) by mouth daily. Take one by mouth once daily as needed for swelling 30 tablet 3  . Omega-3 Fatty Acids (FISH OIL) 1200 MG CAPS Take by mouth daily.      . polyethylene glycol powder (MIRALAX) powder Take otc as directed      No current facility-administered medications on file prior to visit.     Review of Systems Review of Systems    Constitutional: Negative for fever, appetite change,  and unexpected weight change.  Eyes: Negative for pain and visual disturbance.  Respiratory: Negative for cough and shortness of breath.   Cardiovascular: Negative for cp or palpitations    Gastrointestinal: Negative for nausea, diarrhea and constipation.  Genitourinary: Negative for urgency and frequency.  Skin: Negative for pallor or rash   Neurological: Negative for weakness, light-headedness, numbness and headaches.  Hematological: Negative for adenopathy. Does not bruise/bleed easily.  Psychiatric/Behavioral: Negative for dysphoric mood. The patient is not nervous/anxious.         Objective:   Physical Exam  Constitutional: He appears well-developed and well-nourished. No distress.  HENT:  Head: Normocephalic and atraumatic.  Mouth/Throat: Oropharynx is clear and moist.  Eyes: Conjunctivae and EOM are normal. Pupils are equal, round, and reactive to light.  Neck: Normal range of motion. Neck supple. No JVD present. Carotid bruit is not present. No thyromegaly present.  Cardiovascular: Normal rate, regular rhythm, normal heart sounds and intact distal pulses.  Exam reveals no gallop.   Pulmonary/Chest: Effort normal and breath sounds normal. No respiratory distress. He has no wheezes. He has no rales.  No crackles  Abdominal: Soft. Bowel sounds are normal. He exhibits no distension, no abdominal bruit and no mass. There is no tenderness.  Musculoskeletal: He exhibits no edema.  Lymphadenopathy:    He has no cervical adenopathy.  Neurological: He is alert. He has normal reflexes.  Skin: Skin is warm and dry. No rash noted.  AKs diffusely  Ruddy complexion   Psychiatric: He has a normal mood and affect.          Assessment & Plan:   Problem List Items Addressed This Visit      Cardiovascular and Mediastinum   Essential hypertension - Primary    bp in fair control at this time  BP Readings from Last 1 Encounters:   08/10/14 132/78   No changes needed Disc lifstyle change with low sodium diet and exercise  Lab today  Amlodipine refilled       Relevant Medications   amLODIpine (NORVASC) tablet   atorvastatin (LIPITOR) tablet   Other Relevant Orders   CBC with Differential/Platelet (Completed)   Comprehensive metabolic panel (Completed)   TSH (Completed)   Lipid panel (Completed)     Genitourinary   Renal insufficiency    Lab today  Enc water intake and nsaid avoidance         Other   HYPERCHOLESTEROLEMIA  Disc goals for lipids and reasons to control them Rev labs with pt- from last time (lipid check today) Refilled lipitor which he tolerates well  Rev low sat fat diet in detail       Relevant Medications   amLODIpine (NORVASC) tablet   atorvastatin (LIPITOR) tablet   Other Relevant Orders   Lipid panel (Completed)    Other Visit Diagnoses    Need for prophylactic vaccination against Streptococcus pneumoniae (pneumococcus)        Relevant Orders    Pneumococcal conjugate vaccine 13-valent (Completed)

## 2014-08-10 NOTE — Assessment & Plan Note (Signed)
bp in fair control at this time  BP Readings from Last 1 Encounters:  08/10/14 132/78   No changes needed Disc lifstyle change with low sodium diet and exercise  Lab today  Amlodipine refilled

## 2014-08-10 NOTE — Assessment & Plan Note (Signed)
Lab today  Enc water intake and nsaid avoidance

## 2014-08-10 NOTE — Progress Notes (Signed)
Pre visit review using our clinic review tool, if applicable. No additional management support is needed unless otherwise documented below in the visit note. 

## 2014-08-10 NOTE — Patient Instructions (Addendum)
Please push yourself to drink enough water  Labs today  I sent px to your pharmacy  prevnar vaccine today

## 2014-08-11 ENCOUNTER — Telehealth: Payer: Self-pay

## 2014-08-11 ENCOUNTER — Other Ambulatory Visit (INDEPENDENT_AMBULATORY_CARE_PROVIDER_SITE_OTHER): Payer: Medicare HMO

## 2014-08-11 DIAGNOSIS — R946 Abnormal results of thyroid function studies: Secondary | ICD-10-CM

## 2014-08-11 LAB — T4, FREE: FREE T4: 1.14 ng/dL (ref 0.60–1.60)

## 2014-08-11 NOTE — Telephone Encounter (Signed)
Patient aware of lab results.

## 2014-08-11 NOTE — Telephone Encounter (Signed)
-----   Message from Abner Greenspan, MD sent at 08/11/2014  4:30 PM EDT ----- Thyroid is ok -the confirmation test is normal

## 2014-08-21 NOTE — Consult Note (Signed)
Brief Consult Note: Diagnosis: acute cholecystitis.   Patient was seen by consultant.   Consult note dictated.   Recommend further assessment or treatment.   Orders entered.   Comments: elev liver enzymes plus acute cholecysitits.   Will obtain MRCP to ensure no stones in CBD. If CBD stones, would go ahead with ERCP before cholecystectomy.  Otherwise, likely passed stone and no need for ERCP.  Electronic Signatures: Arther Dames (MD)  (Signed 25-Dec-14 15:43)  Authored: Brief Consult Note   Last Updated: 25-Dec-14 15:43 by Arther Dames (MD)

## 2014-08-21 NOTE — H&P (Signed)
PATIENT NAME:  Matthew [name], Matthew [name] MR#:  [id number] DATE OF BIRTH:  1925/02/23  DATE OF ADMISSION:  04/23/2013  PRIMARY CARE PHYSICIAN:  Dr. Glori Bickers.   CHIEF COMPLAINT:  Right upper quadrant abdominal pain.   HISTORY OF PRESENT ILLNESS:  This is a very pleasant 81 year old male with a history of hypertension, hyperlipidemia who presents with the above complaint.  Since Monday the patient has had right upper quadrant abdominal pain with nausea, vomiting and chills.  He is unable to eat due to the pain and decreased appetite secondary to the right upper quadrant abdominal pain.  He was actually seen earlier today in [place] urgent care.  They felt that he needed a CT and so he was sent here for further evaluation.  Here in the ER, he did receive an ultrasound of the abdomen which basically showed early acute cholecystitis.  CT of the abdomen noncontrast showed cholelithiasis.  He was seen by Dr. Leanora Cover from surgery.  Dr. Leanora Cover felt that he may have acute cholecystitis, but labs were way out of proportion and he suggested a HIDA and/or MRCP.  Hospitalist was consulted for admission.    REVIEW OF SYSTEMS:  CONSTITUTIONAL:  No fever.  Positive chills.  Positive fatigue and weakness.  No weight loss or weight gain.  EYES:  No blurred or double vision.  EARS, NOSE, THROAT:  No ear pain, hearing loss, seasonal allergies, postnasal drip or sinus pain.  RESPIRATORY:  No cough, wheezing, hemoptysis, dyspnea, painful respirations, COPD.  CARDIOVASCULAR:  No chest pain, orthopnea, edema, arrhythmia, dyspnea on exertion, palpitations or syncope.  GASTROINTESTINAL:  Positive nausea.  Positive vomiting.  No diarrhea.  Positive right upper quadrant abdominal pain.  Positive mild jaundice.  No IBS or hematemesis, melena, ulcers.  GENITOURINARY:  No dysuria, hematuria.  ENDOCRINE:  No polyuria, polydipsia or thyroid problems.   HEMATOLOGY AND LYMPHATIC:  No anemia or easy bruising. SKIN:  No rash or lesions.   MUSCULOSKELETAL:  No limited activity, gout.  NEUROLOGIC:  No history of CVA, TIA or seizures.  PSYCHIATRIC:  No history of anxiety or depression.  PAST MEDICAL HISTORY:   1.  Hypertension.  2.  Hyperlipidemia.  3.  Vertigo.   MEDICATIONS: 1.  Amlodipine, unknown dose.  2.  Lipitor, unknown dose.  3.  Aspirin 81 mg daily.  4.  Fish oil.  5.  Vitamin D.   SOCIAL HISTORY:  No tobacco, alcohol or IV drug use.   FAMILY HISTORY:  Unknown.   ALLERGIES:   1.  IVP DYE CAUSES HIVES.  2.  PENICILLIN CAUSES A RASH.  3.  CODEINE.   PAST SURGICAL HISTORY:  The patient had some cysts removed from his wrist.   PHYSICAL EXAMINATION: VITAL SIGNS:  Temperature 98.7, pulse is 94, respirations 18, blood pressure 116/66, 92% on room air.  GENERAL:  The patient is alert, oriented, not in acute distress.  HEENT:  Head is atraumatic.  Pupils are round and reactive.  Sclerae are positive icteric.  Mucous membranes are dry.  Oropharynx is clear.  NECK:  Supple without JVD, carotid bruit, enlarged thyroid.  CARDIOVASCULAR:  Two out of six systolic ejection murmur heard best at the left sternal border without radiation.  PMI is not displaced.  LUNGS:  Clear to auscultation without crackles, rales, rhonchi or wheezing.  Normal percussion.  No egophony or no dullness to percussion.  ABDOMEN:  Bowel sounds are positive.  Nontender, nondistended.  No hepatosplenomegaly.  No rebound or guarding.  However, it  should be noted the patient did receive 8 mg of morphine.  EXTREMITIES:  He has 1+ pitting edema which is chronic in nature.    NEUROLOGIC:  Cranial nerves II through XII are intact.  No focal deficits.  SKIN:  Without rash or lesions.  The patient does have some mild jaundice. BACK:  No CVA or vertebral tenderness.     LABORATORY DATA:  Lipase 125, sodium 134, potassium 3.5, chloride 101, bicarb 26, BUN 31, creatinine 1.32.  Glucose is 120, calcium 9.3.  Bilirubin 4.7, alk phos 297, ALT 635, AST 514,  total protein 7.0, albumin 3.3.  Troponin less than 0.02.  White blood cells 11.8, hemoglobin 15.6, hematocrit 47, platelets are 177.  Urinalysis is negative LE or nitrites.  Ultrasound of the abdomen showed distended gallbladder with borderline wall thickening and gallstones in conjunction with CT.  The findings are suggestive of acute early cholecystitis, probable impacted stone in the gallbladder neck.   CT of the abdomen showed cholelithiasis, nonspecific bowel gas pattern.   Three-way x-ray including abdomen included dilated right upper quadrant bowel loop, question proximal transverse colon measuring 9 cm diameter without definitive evidence of obstruction or perforation.   EKG:  Sinus tachycardia, heart rate of 103.  No ST elevations or depressions.   ASSESSMENT AND PLAN:  This is an 81 year old male with history of hypertension, hyperlipidemia who presents with three days of right upper quadrant abdominal pain, nausea, poor appetite.  1.  Right upper quadrant abdominal pain with evidence of acute cholecystitis on ultrasound and CT with probable impacted gallstone in the gallbladder neck.  The patient was seen by surgery.  As per Dr. Delice Lesch note, he writes he may indeed have acute cholecystitis, but his labs are way out of proportion to his disease if he does.  He suggested a HIDA or MRCP in addition to medical evaluation.  I would treat with antibiotics in case he has acute cholecystitis.  I do believe that patient does have acute cholecystitis with probable passed stone or impacted stone.  I will order an MRCP, GI evaluation, but ultimately the patient will likely need his gallbladder surgically removed.  I started ciprofloxacin and Flagyl to cover for acute cholecystitis.  Surgery is also consulted.  2.  Elevated liver function tests secondary to likely impacted gallstone versus passed gallstone.  Hold any statin medications at this time as he is on statin.  I will also check a Tylenol level,  but again, I think the issue is as stated already. 3.  Hypertension:  We will continue Norvasc.  We do not know the dose, but I will write for 10 mg at this time and I have asked nursing to obtain his outpatient medication list.  I suspect his lower extremity edema is due to his Norvasc.  4.  Lower extremity edema which is chronic in nature, bilateral.  I suspect this is related to his medications.  5.  Hyperlipidemia.  We will Lipitor at this time. 6.  The patient is a DO NOT RESUSCITATE status.   TIME SPENT:  Approximately 60 minutes.    ____________________________ Donell Beers. Benjie Karvonen, MD spm:ea D: 04/23/2013 22:43:55 ET T: 04/23/2013 23:16:31 ET JOB#: [id number]  cc: Leodan Bolyard P. Benjie Karvonen, MD, <Dictator> Marne A. Glori Bickers, MD Ulice Bold P Orlando Thalmann MD ELECTRONICALLY SIGNED 04/24/2013 2:11

## 2014-08-21 NOTE — Consult Note (Signed)
Details:   - GI follow up.  Abd pain improved today.  Tolerating clears.  No f/c, n/v, c/p, sob.  Exam:   Chest: CTA CV: reg, no m/r/g Abd: mild epigastric tenderness, soft, nabs  A/P:  MRCP today shows likely cholecystitis, no CBD dilation or CBD stones.  Liver enzymes trending down.  - no plans for ERCP based on MRI, will likely undergo cholecystectomy  - no further GI recs at this time.   Electronic Signatures: Arther Dames (MD)  (Signed 26-Dec-14 17:23)  Authored: Details   Last Updated: 26-Dec-14 17:23 by Arther Dames (MD)

## 2014-08-21 NOTE — Consult Note (Signed)
PATIENT NAME:  Matthew [name], Matthew [name] MR#:  [id number] DATE OF BIRTH:  [birth year]  DATE OF CONSULTATION:  04/23/2013  CONSULTING PHYSICIAN:  Consuela Mimes, MD  HISTORY OF PRESENT ILLNESS: Matthew [name] is an 81 year old white male who has a 3-day history of constant epigastric and right upper quadrant abdominal pain associated with mild nausea and 1 episode of what he calls regurgitation of acid-tasting yellowish fluid. He had a normal bowel movement today and typically has a bowel movement every day. He has not noticed a change in the color of his skin, eyes, urine, or stool. He says that if he has had a fever, it has been mild. He has been able to catnap, but his sleep has been significantly impacted by his abdominal pain.   PAST MEDICAL HISTORY: Vertigo, chronic renal insufficiency, history of dyslipidemia, hypertension.   MEDICATIONS: Aspirin 81 mg daily;  amlodipine, unknown dose.   ALLERGIES: PENICILLIN, CODEINE, IODINATED RADIOCONTRAST DYES.   FAMILY HISTORY: Noncontributory.   SOCIAL HISTORY: The patient is married and lives with his wife. He is a retired Clinical cytogeneticist. He has not had cigarettes or had any alcoholic beverages since he was 81 years old.   REVIEW OF SYSTEMS: Negative for 10 systems except for the abdominal pain and mild regurgitation mentioned in the history of present illness. Specifically, the patient denies change in the color of his urine, stool, eyes, or skin. He also denies fever and chills.   PHYSICAL EXAMINATION:  GENERAL: A quite elderly white male, 5 feet 10 inches tall, 165 pounds, BMI 23.7.  VITAL SIGNS: Temperature 98.7, pulse 108, respirations 19, blood pressure 106/65, oxygen saturation 98% on room air at rest.  HEENT: Pupils are equally round and reactive to light. Extraocular movements intact. Sclerae show mild icterus.  OROPHARYNX: Clear. Mucous membranes moist. Hearing intact to voice.  NECK: Supple with no jugular venous distention or tracheal  deviation.  HEART: Regular rate and rhythm with no murmurs or rubs.  LUNGS: Clear to auscultation with normal respiratory effort bilaterally.  ABDOMEN: Soft, nontender, nondistended with no palpable hepatosplenomegaly or other masses. Specifically, with deep palpation in the right upper quadrant, no tenderness of any kind is elicited.  EXTREMITIES: Revealed moderate edema in bilateral lower extremities with normal capillary refill and very thin skin.  NEUROLOGIC: Cranial nerves II through XII, motor and sensation grossly intact.  PSYCHIATRIC: Alert and oriented x4. Appropriate affect.   LABORATORY, DIAGNOSTIC, AND RADIOLOGICAL DATA: White blood cell count 11.8, hematocrit 47%, platelet count 177,000. BUN 31, creatinine 1.3. Other electrolytes normal. Lipase normal. Bilirubin 4.7, alkaline phosphatase 297, SGOT 514, SGPT 635. Troponin is normal. Urinalysis is normal.   CT scan of the abdomen and pelvis without contrast reveals calcified gallstones as large as 18 mm in diameter with mild gallbladder distention, an exophytic right upper renal cyst measuring 11 mm with a nonspecific gas pattern, a small duodenal diverticulum, and significant prostate enlargement (66 x 41 x 44 mm).  Ultrasound of the right upper quadrant reveals gallstones with gallbladder-wall thickening that is borderline at 4 mm, no pericholecystic fluid, and a normal common bile duct diameter at 5 mm. The gallstones were seen to be nonmobile. There was also echogenic liver with old granulomatous disease and possible hepatic steatosis.   ASSESSMENT AND PLAN: A patient with a 3-day history of abdominal pain relieved with 8 mg IV morphine in the Emergency Department over the last number of hours. He is currently completely asymptomatic. Although he may have acute cholecystitis, his  hepatic transaminases and bilirubin are out of proportion to his symptoms and physical findings as well as his very equivocal findings on ultrasound and CT scan  examination.   Since the patient has peripheral edema, it is quite possible that he has a cardiovascular origin to his elevated hepatic transaminases and this hyperbilirubinemia may well be cholestatic jaundice from a hepatocellular source rather than an obstructive source. This is borne out by the fact that there is a 3-day history of almost constant pain but a normal common bile duct diameter of 5 mm.   Thus, the surgical service would be happy to consult on this patient and I suggest that he be treated with antibiotics while further evaluation is undertaken, not only regarding his cardiovascular status but also regarding his hepatobiliary status. A HIDA scan and/or  MRCP may be beneficial in determining the etiology of his problem.   Repeat hepatic transaminases and a bilirubin may also be useful.   ____________________________ Consuela Mimes, MD wfm:np D: 04/23/2013 22:01:04 ET T: 04/23/2013 22:54:38 ET JOB#: [id number]  cc: Consuela Mimes, MD, <Dictator> Consuela Mimes MD ELECTRONICALLY SIGNED 04/24/2013 19:19

## 2014-08-21 NOTE — Consult Note (Signed)
Brief Consult Note: Diagnosis: markedly elevated hepatic transaminases, gallstones.   Patient was seen by consultant.   Consult note dictated.   Recommend further assessment or treatment.   Discussed with Attending MD.   Comments: See dictated full note. The findings on non-contrast CT and U/S are quite equivocal, and do not explain his bilirubin of 4.7, SGOT/PT of 500 and 635, particularly with a WBC of only 11. GB wall of 4 mm is barely thick. There is no pericholecystic fluid. And the stones are not impacted in his GB neck, by my review. His CBD diameter is normal, suggesting a hepatocellular cause of his jaundice and elevated LFTs. Although he has had 3 days of pain, he is completely ASx and non-tender on examination now that he has had 8 mg IV morphine sulfate. He may indeed have acute cholecystitis, but, again, his labs are way out of proportion to his disease if he does. I suggest a HIDA and/or MRCP, in addition to a medical evaluation, as he has moderate lower extremity edema, and this may be cardiovascular in etiology. We will consult. I would treat him with ABx in case acute cholecystitis (or cholangitis) is present.  Electronic Signatures: Consuela Mimes (MD)  (Signed 24-Dec-14 21:48)  Authored: Brief Consult Note   Last Updated: 24-Dec-14 21:48 by Consuela Mimes (MD)

## 2014-08-22 NOTE — Consult Note (Signed)
PATIENT NAME:  Matthew [name], Matthew [name] MR#:  [id number] DATE OF BIRTH:  03-Jan-1925  DATE OF CONSULTATION:  04/23/2013  REFERRING PHYSICIAN:  Dr. Bettey Costa. CONSULTING PHYSICIAN:  Arther Dames, MD  REASON FOR THE CONSULT: Elevated liver enzymes, acute cholecystitis.   HISTORY OF PRESENT ILLNESS: Matthew [name] is an 81 year old male with a history of hypertension, hyperlipidemia, who presented to the Emergency Room for evaluation of right upper quadrant pain. This had been going on for several days. He actually presented to the urgent care before being evaluated in the Emergency Room.   In the Emergency Room, he was noted to have elevated liver enzymes and also had a CT scan and ultrasound, which were suggestive of early acute cholecystitis.   Currently, Matthew [name] feels as though his pain has improved significantly. He is tolerating a clear diet at this time. He has been seen by surgery, who also recommends a cholecystectomy, depending on findings on M.R.C.P.   PAST MEDICAL HISTORY: 1.  Hypertension.  2.  Hyperlipidemia.  3.  Vertigo.  HOME MEDICATIONS:  1.  Amlodipine.  2.  Lipitor.  3.  Aspirin.  4.  Fish oil. 5.  Vitamin D.   SOCIAL HISTORY: To me, he denies any tobacco or alcohol.   FAMILY HISTORY: He denies any family history of GI malignancy that he is aware of.   ALLERGIES: IV CONTRAST CAUSES HIVES. PENICILLIN CAUSES A RASH, AND CODEINE.   REVIEW OF SYSTEMS:   CONSTITUTIONAL: No weight gain or weight loss.  No fever or chills. HEENT: No oral lesions or sore throat. No vision changes. GASTROINTESTINAL: See HPI.  HEME/LYMPH: No easy bruising or bleeding. CARDIOVASCULAR: No chest pain or dyspnea on exertion. GENITOURINARY: No hematuria. INTEGUMENTARY: No rashes or pruritus PSYCHIATRIC: No depression/anxiety.  ENDOCRINE: No heat/cold intolerance, no hair loss or skin changes. ALLERGIC/IMMUNOLOGIC: Negative for hives. RESPIRATORY: No cough, no shortness of breath.  MUSCULOSKELETAL:  No joint swelling or muscle pain.   PHYSICAL EXAMINATION: VITAL SIGNS: Temperature 97.8. Pulse is 102. Respirations are 18. Blood pressure is 155/78. Pulse ox is 91% on room air.  GENERAL: Alert and oriented x 4.  No acute distress. Appears stated age. HEENT: Normocephalic/atraumatic. Extraocular movements are intact. Anicteric. NECK: Soft, supple. JVP appears normal. No adenopathy. CHEST: Clear to auscultation. No wheeze or crackle. Respirations unlabored. HEART: Regular. No murmur, rub, or gallop.  Normal S1 and S2. ABDOMEN:  Positive for mild right upper quadrant tenderness. Liver is not able to be palpated.  No rebound or guarding. Soft.  EXTREMITIES: No swelling, well perfused. SKIN: No rash or lesion. Skin color, texture, turgor normal. NEUROLOGICAL: Grossly intact. PSYCHIATRIC: Normal tone and affect. MUSCULOSKELETAL: No joint swelling or erythema.    LABORATORY DATA: Sodium 138, potassium 3.8, BUN 24, creatinine 1.34. Lipase is 125. Total bilirubin 3.9, alk phos 238, AST 284, ALT 431. White count is 14.1, hemoglobin 12.5. Hematocrit is 36. Platelets are 150.   ASSESSMENT AND PLAN: CT scan shows mild gallbladder distention and calcified gallstones in the gallbladder up to 18 mm. The ultrasound shows distended gallbladder with borderline wall thickening and gallstones, along with an echogenic liver with old granulomatous disease.  1.  Right upper quadrant pain, elevated liver enzymes: It does appear as though Matthew [name] may have acute cholecystitis based on his imaging. He also does have significantly elevated liver enzymes in an obstructive pattern. He does not have evidence of CBD dilation on ultrasound or CT scan, which would potentially argue against a CBD stone. However,  given the elevated liver enzymes, it would be important to rule out a CBD stone at this time.   PLAN: We will obtain an M.R.C.P. to evaluate for a CBD stone. If there is a CBD stone still in place, then would go ahead  with an ERCP. However, if no CBD stones are present on M.R.C.P., he likely passed the stone and can go directly for cholecystectomy. I do not have any additional recommendations at this time. Further recommendations pending the findings on the M.R.C.P.   Thank you for this consult.   ____________________________ Arther Dames, MD mr:dmm D: 04/24/2013 20:36:00 ET T: 04/24/2013 20:51:01 ET JOB#: [id number]  cc: Arther Dames, MD, <Dictator> Mellody Life MD ELECTRONICALLY SIGNED 05/04/2013 17:01

## 2014-08-22 NOTE — Op Note (Signed)
PATIENT NAME:  Matthew [name], Matthew [name] MR#:  [id number] DATE OF BIRTH:  1924-12-08  DATE OF PROCEDURE:  04/26/2013  PREOPERATIVE DIAGNOSIS: Acute cholecystitis.   POSTOPERATIVE DIAGNOSIS: Acute cholecystitis.   PROCEDURE: Laparoscopic cholecystectomy with cholangiography.   SURGEON: Rodena Goldmann III, MD   ANESTHESIA: General.   DESCRIPTION OF PROCEDURE: With the patient in supine position after induction of appropriate general anesthesia, the patient's abdomen was prepped with ChloraPrep and draped with sterile towels. The patient was placed in the head down, feet up position. A small infraumbilical incision was made in the standard fashion and carried down bluntly through the subcutaneous tissue. A Veress needle was used to cannulate the peritoneal cavity. CO2 was insufflated to appropriate pressure measurements. When approximately 2.5 liters of CO2 were instilled, the Veress needle was withdrawn and an 11 mm [employer] port was inserted into the peritoneal cavity. Intraperitoneal position was confirmed and CO2 was reinsufflated. The patient was placed in the head up, feet down position and rotated slightly to the left side. A subxiphoid transverse incision was made and an 11 mm port inserted under direct vision. Two lateral ports, 5 mm in size, were inserted under direct vision. The gallbladder was significantly distended, edematous, and discolored with evidence of fibrinous exudate over the apex of the right colon and around the hepatoduodenal ligament. The gallbladder was drained of approximately 40 mL of a red-brown bile. The gallbladder was then retracted superiorly and laterally, exposing the hepatoduodenal ligament. The cystic artery and cystic duct were identified. The cystic duct was clipped on the gallbladder side and opened. An on-table cholangiogram using dynamic fluoroscopy revealed slow flow of bile into the duodenum but no obvious obstruction. The duct tapered normally and no obstruction  was seen. The intrahepatic radicles were not visualized because of some extravasation of the contrast. The catheter was withdrawn. The anatomy confirmed, the cystic duct was doubly clipped on the common duct side and divided. The cystic artery was doubly clipped and divided. The gallbladder was then dissected free from its bed and delivered using hook and cautery apparatus. There was significant edema noted. Once the gallbladder was free, the gallbladder was captured in an Endo Catch apparatus through the epigastric incision. This Endo Catch device was removed without difficulty. The area was copiously suctioned and irrigated. The upper midline incision was closed with figure-of-eight sutures of 0 Vicryl using the suture passer apparatus. The abdomen was then desufflated. All ports were withdrawn without difficulty. The area was infiltrated with 0.25% Marcaine for postoperative pain control. The incisions were closed with 5-0 nylon suture. Sterile dressings were applied. The patient was returned to the recovery room, having tolerated the procedure well. Sponge, needle, and instrument counts were correct x 2 in the operating room.    ____________________________ Micheline Maze, MD rle:jcm D: 04/26/2013 12:38:16 ET T: 04/26/2013 12:55:24 ET JOB#: [id number]  cc: Micheline Maze, MD, <Dictator> Arther Dames, MD Wynelle Fanny. Glori Bickers, MD Rodena Goldmann MD ELECTRONICALLY SIGNED 05/01/2013 16:20

## 2014-08-22 NOTE — Discharge Summary (Signed)
PATIENT NAME:  Matthew [name], Matthew [name] MR#:  [id number] DATE OF BIRTH:  [birth year]  DATE OF ADMISSION:  04/23/2013 DATE OF DISCHARGE:  04/30/2013   PRIMARY DOCTOR: Roque Lias A. Tower, MD  DISCHARGE DIAGNOSES:  1. Abdominal pain secondary to acute cholecystitis, status post cholecystectomy.  2. Hypertension.  3. Benign prostatic hypertrophy.   DISCHARGE MEDICATIONS:  1. Lipitor 20 mg p.o. at bedtime.  2. Amlodipine 5 mg p.o. daily.  3. Aspirin 81 mg daily.  4. Percocet 5/325 one tablet every 4 hours as needed for pain.  5. Tramadol 50 mg q.6 hours p.r.n. for pain. 6. Tamsulosin 0.4 mg p.o. daily. Tamsulosin is a new medication.   CONSULTATIONS: Surgical consult. The patient followed by Dr. [name], Dr. Rexene Edison for followup appointment.   HOSPITAL COURSE: Acute cholecystitis. This is an 81 year old male admitted because of right upper quadrant abdominal pain with nausea and vomiting. The patient's abdominal ultrasound showed acute cholecystitis. The patient's CT with noncontrast showed cholelithiasis. The patient admitted to medical service for that, kept n.p.o. IV fluids were given. IV pain medications were given. Chest x-ray negative for pneumonia. He also received antibiotics during the hospital stay. The patient's liver functions on admission showed alkaline phosphatase 297, bilirubin 4.7, ALT 635, AST 514. The patient's blood cultures have been negative. Urine cultures have been negative. He had an MRCP which showed cholelithiasis without evidence of intraductal filling, and the patient does have gallbladder distention and edema. The patient was taken to OR, had a laparoscopic cholecystectomy on December 27th. After the procedure, the patient had some nausea, but abdominal pain completely resolved. The patient tolerated the procedure well. For nausea, started on Zofran and also along with PPI, and his nausea improved, tolerating the diet so far. Afebrile since surgery, BP was normal. The patient's liver  functions on the 28th showed alkaline phosphatase of 204, ALT of 179, AST 101, and before the procedure, the patient had ALT 285, AST 135, and alkaline phosphatase 233. The patient's LFTs trended down, and his bilirubin total was 0.8. The patient tolerating the diet well. He did have some urinary retention postoperatively. Started on Flomax, and Foley catheter kept for 24 hours, then discontinued. The patient did void without catheter, and he started on Flomax. He will be going home with Flomax.   CONDITION AT THIS TIME: Stable.   TIME SPENT ON DISCHARGE PREPARATION: More than 30 minutes.   ____________________________ Epifanio Lesches, MD sk:lb D: 04/30/2013 13:06:55 ET T: 04/30/2013 13:54:44 ET JOB#: [id number]  cc: Epifanio Lesches, MD, <Dictator> Epifanio Lesches MD ELECTRONICALLY SIGNED 05/15/2013 23:37

## 2014-11-03 ENCOUNTER — Other Ambulatory Visit: Payer: Self-pay | Admitting: Family Medicine

## 2015-02-11 ENCOUNTER — Ambulatory Visit (INDEPENDENT_AMBULATORY_CARE_PROVIDER_SITE_OTHER): Payer: Medicare HMO

## 2015-02-11 DIAGNOSIS — Z23 Encounter for immunization: Secondary | ICD-10-CM | POA: Diagnosis not present

## 2015-08-02 ENCOUNTER — Encounter: Payer: Self-pay | Admitting: Family Medicine

## 2015-08-02 ENCOUNTER — Ambulatory Visit (INDEPENDENT_AMBULATORY_CARE_PROVIDER_SITE_OTHER): Payer: Medicare HMO | Admitting: Family Medicine

## 2015-08-02 VITALS — BP 112/68 | HR 76 | Temp 97.6°F | Ht 67.0 in | Wt 147.5 lb

## 2015-08-02 DIAGNOSIS — E7800 Pure hypercholesterolemia, unspecified: Secondary | ICD-10-CM

## 2015-08-02 DIAGNOSIS — N289 Disorder of kidney and ureter, unspecified: Secondary | ICD-10-CM | POA: Diagnosis not present

## 2015-08-02 DIAGNOSIS — I10 Essential (primary) hypertension: Secondary | ICD-10-CM | POA: Diagnosis not present

## 2015-08-02 LAB — LIPID PANEL
CHOLESTEROL: 135 mg/dL (ref 0–200)
HDL: 49.5 mg/dL (ref >39.00)
LDL CALC: 74 mg/dL (ref 0–99)
NonHDL: 85.98
TRIGLYCERIDES: 61 mg/dL (ref 0.0–149.0)
Total CHOL/HDL Ratio: 3
VLDL: 12.2 mg/dL (ref 0.0–40.0)

## 2015-08-02 LAB — COMPREHENSIVE METABOLIC PANEL
ALT: 18 U/L (ref 0–53)
AST: 20 U/L (ref 0–37)
Albumin: 4.1 g/dL (ref 3.5–5.2)
Alkaline Phosphatase: 97 U/L (ref 39–117)
BUN: 30 mg/dL — ABNORMAL HIGH (ref 6–23)
CALCIUM: 9.8 mg/dL (ref 8.4–10.5)
CHLORIDE: 104 meq/L (ref 96–112)
CO2: 27 meq/L (ref 19–32)
Creatinine, Ser: 1.25 mg/dL (ref 0.40–1.50)
GFR: 57.87 mL/min — AB (ref >60.00)
Glucose, Bld: 94 mg/dL (ref 70–99)
POTASSIUM: 4 meq/L (ref 3.5–5.1)
Sodium: 140 mEq/L (ref 135–145)
Total Bilirubin: 0.8 mg/dL (ref 0.2–1.2)
Total Protein: 7.3 g/dL (ref 6.0–8.3)

## 2015-08-02 LAB — CBC WITH DIFFERENTIAL/PLATELET
BASOS PCT: 0.3 % (ref 0.0–3.0)
Basophils Absolute: 0 10*3/uL (ref 0.0–0.1)
EOS ABS: 0.2 10*3/uL (ref 0.0–0.7)
EOS PCT: 2.5 % (ref 0.0–5.0)
HEMATOCRIT: 42.5 % (ref 39.0–52.0)
Hemoglobin: 14.2 g/dL (ref 13.0–17.0)
LYMPHS PCT: 22.7 % (ref 12.0–46.0)
Lymphs Abs: 2 10*3/uL (ref 0.7–4.0)
MCHC: 33.5 g/dL (ref 30.0–36.0)
MCV: 91.9 fl (ref 78.0–100.0)
MONOS PCT: 6.2 % (ref 3.0–12.0)
Monocytes Absolute: 0.5 10*3/uL (ref 0.1–1.0)
NEUTROS ABS: 6 10*3/uL (ref 1.4–7.7)
Neutrophils Relative %: 68.3 % (ref 43.0–77.0)
PLATELETS: 236 10*3/uL (ref 150.0–400.0)
RBC: 4.62 Mil/uL (ref 4.22–5.81)
RDW: 13.5 % (ref 11.5–15.5)
WBC: 8.8 10*3/uL (ref 4.0–10.5)

## 2015-08-02 LAB — TSH: TSH: 4.25 u[IU]/mL (ref 0.35–4.50)

## 2015-08-02 MED ORDER — ATORVASTATIN CALCIUM 40 MG PO TABS: ORAL_TABLET | ORAL | Status: DC

## 2015-08-02 MED ORDER — AMLODIPINE BESYLATE 10 MG PO TABS: ORAL_TABLET | ORAL | Status: DC

## 2015-08-02 NOTE — Assessment & Plan Note (Signed)
bp in fair control at this time  BP Readings from Last 1 Encounters:  08/02/15 112/68   No changes needed Disc lifstyle change with low sodium diet and exercise  Labs today  No changes-continue amlodipine  He has not needed lasix lately for edema

## 2015-08-02 NOTE — Progress Notes (Signed)
Subjective:    Patient ID: Matthew [name], male    DOB: 1924-10-09, 82 y.o.   MRN: [id number]  HPI Here for f/u of chronic medical problems   Not doing a lot lately  Feels the slow down of age -but no major changes   Gets tired more quickly  No sob or cp or other symptoms   Wt is down 3 lb with bmi of 23 Is eating fine  Using an exercise machine in the house - pedal and arms    bp is stable today  No cp or palpitations or headaches or edema  No side effects to medicines  BP Readings from Last 3 Encounters:  08/02/15 112/68  08/10/14 132/78  07/09/13 124/82   has not needed lasix at all -not swollen     Hyperlipidemia Due for a check of lipid Lab Results  Component Value Date   CHOL 127 08/10/2014   HDL 46.20 08/10/2014   LDLCALC 68 08/10/2014   TRIG 64.0 08/10/2014   CHOLHDL 3 08/10/2014      Renal insuff  Due for labs Lab Results  Component Value Date   CREATININE 1.28 08/10/2014   BUN 29* 08/10/2014   NA 139 08/10/2014   K 3.9 08/10/2014   CL 105 08/10/2014   CO2 26 08/10/2014  he has to make himself drink water - trying  12-20 oz per day  Also coffee and juices  Coffee is decaf  No falls  Not using a cane or walker at this point   Patient Active Problem List   Diagnosis Date Noted  . Encounter for Medicare annual wellness exam 07/09/2013  . Fatigue 07/09/2013  . Hx laparoscopic cholecystectomy 05/16/2013  . Prostate cancer screening 06/04/2011  . CONSTIPATION, CHRONIC 01/12/2010  . Nonspecific (abnormal) findings on radiological and other examination of body structure 10/07/2007  . ALLERGIC CONJUNCTIVITIS 10/04/2007  . EDEMA 10/04/2007  . HYPERCHOLESTEROLEMIA 08/10/2006  . RESTLESS LEG SYNDROME 08/10/2006  . Essential hypertension 08/10/2006  . ALLERGIC RHINITIS 08/10/2006  . Renal insufficiency 08/10/2006  . SKIN CANCER, HX OF 08/10/2006   Past Medical History  Diagnosis Date  . Allergic rhinitis   . History of transfusion of  whole blood   . Melanoma ([place])     history of, by right eye  . Hypertension   . Renal insufficiency   . Chronic kidney disease     stage 3, [place] benign appearing cysts 05/2006  . Pulmonary nodules     being obs with serial CT, 6 month f/u stable 10/05  . Chronic constipation   . History of CT scan of chest 6/09    calcification consistent with asbestos exp, also pleural nodule, incidental gallstones   Past Surgical History  Procedure Laterality Date  . Ganglion cyst excision      hands  . 2d echo  6/09    slight thickening of aortic valve with EF of 60%   Social History  Substance Use Topics  . Smoking status: Former Research scientist (life sciences)  . Smokeless tobacco: None     Comment: Quit smoking in 1957.  Marland Kitchen Alcohol Use: No   Family History  Problem Relation Age of Onset  . Hypertension Mother   . Hypertension Father   . Stroke Father   . Cancer Brother     pancreatic   Allergies  Allergen Reactions  . Chlorthalidone     REACTION: frequent urination  . Codeine     REACTION: nausea  . Iodine  REACTION: caused bubble to come up in eye  . Penicillins     REACTION: GI  . Ramipril     REACTION: cough  . Ropinirole Hydrochloride     REACTION: insomnia   Current Outpatient Prescriptions on File Prior to Visit  Medication Sig Dispense Refill  . amLODipine (NORVASC) 10 MG tablet TAKE 1 TABLET (10 MG TOTAL) BY MOUTH DAILY. 90 tablet 3  . aspirin 81 MG tablet Take 81 mg by mouth daily.      Marland Kitchen atorvastatin (LIPITOR) 40 MG tablet ONE TABLET BY MOUTH EVERY DAY. 90 tablet 3  . Cholecalciferol (VITAMIN D) 1000 UNITS capsule Take 1,000 Units by mouth daily.      . furosemide (LASIX) 20 MG tablet Take 1 tablet (20 mg total) by mouth daily. Take one by mouth once daily as needed for swelling 30 tablet 3  . Omega-3 Fatty Acids (FISH OIL) 1200 MG CAPS Take by mouth daily.      . polyethylene glycol powder (MIRALAX) powder Take otc as directed      No current facility-administered medications on  file prior to visit.     Review of Systems    Review of Systems  Constitutional: Negative for fever, appetite change,  and unexpected weight change. pos for fatigue Eyes: Negative for pain and visual disturbance.  Respiratory: Negative for cough and shortness of breath.   Cardiovascular: Negative for cp or palpitations    Gastrointestinal: Negative for nausea, diarrhea and constipation.  Genitourinary: Negative for urgency and frequency.  Skin: Negative for pallor or rash   Neurological: Negative for weakness, light-headedness, numbness and headaches.  Hematological: Negative for adenopathy. Does not bruise/bleed easily.  Psychiatric/Behavioral: Negative for dysphoric mood. The patient is not nervous/anxious.      Objective:   Physical Exam  Constitutional: He appears well-developed and well-nourished. No distress.  Frail appearing elderly male  HENT:  Head: Normocephalic and atraumatic.  Mouth/Throat: Oropharynx is clear and moist.  Eyes: Conjunctivae and EOM are normal. Pupils are equal, round, and reactive to light.  Neck: Normal range of motion. Neck supple. No JVD present. Carotid bruit is not present. No thyromegaly present.  Cardiovascular: Normal rate, regular rhythm, normal heart sounds and intact distal pulses.  Exam reveals no gallop.   Varicosities diffusely in LEs  Pulmonary/Chest: Effort normal and breath sounds normal. No respiratory distress. He has no wheezes. He has no rales.  No crackles  Abdominal: Soft. Bowel sounds are normal. He exhibits no distension, no abdominal bruit and no mass. There is no tenderness.  Musculoskeletal: He exhibits edema.  Trace edema in ankles bilaterally  Lymphadenopathy:    He has no cervical adenopathy.  Neurological: He is alert. He has normal reflexes. No cranial nerve deficit. He exhibits normal muscle tone. Coordination normal.  Slow wide based gait  Skin: Skin is warm and dry. No rash noted.  SKs and some AKs diffusely    Psychiatric: He has a normal mood and affect.  Pleasant and talkative           Assessment & Plan:   Problem List Items Addressed This Visit      Cardiovascular and Mediastinum   Essential hypertension - Primary    bp in fair control at this time  BP Readings from Last 1 Encounters:  08/02/15 112/68   No changes needed Disc lifstyle change with low sodium diet and exercise  Labs today  No changes-continue amlodipine  He has not needed lasix lately for edema  Relevant Medications   atorvastatin (LIPITOR) 40 MG tablet   amLODipine (NORVASC) 10 MG tablet   Other Relevant Orders   CBC with Differential/Platelet (Completed)   Comprehensive metabolic panel (Completed)   TSH (Completed)   Lipid panel (Completed)     Genitourinary   Renal insufficiency    Lab today Disc fluid intake - planning to increase fluids       Relevant Orders   Comprehensive metabolic panel (Completed)     Other   HYPERCHOLESTEROLEMIA    With generic atorvastatin and diet Disc goals for lipids and reasons to control them Rev labs with pt  (last draw) Rev low sat fat diet in detail       Relevant Medications   atorvastatin (LIPITOR) 40 MG tablet   amLODipine (NORVASC) 10 MG tablet   Other Relevant Orders   Lipid panel (Completed)

## 2015-08-02 NOTE — Assessment & Plan Note (Signed)
With generic atorvastatin and diet Disc goals for lipids and reasons to control them Rev labs with pt  (last draw) Rev low sat fat diet in detail

## 2015-08-02 NOTE — Progress Notes (Signed)
Pre visit review using our clinic review tool, if applicable. No additional management support is needed unless otherwise documented below in the visit note. 

## 2015-08-02 NOTE — Patient Instructions (Signed)
Take care of yourself  Careful not to fall - use a walker or cane if you feel unsteady  Also please try to drink more fluid for kidney health Stay as active as you can  Labs today  I refilled medicines

## 2015-08-02 NOTE — Assessment & Plan Note (Signed)
Lab today Disc fluid intake - planning to increase fluids

## 2015-08-03 ENCOUNTER — Encounter: Payer: Self-pay | Admitting: *Deleted

## 2015-11-11 ENCOUNTER — Inpatient Hospital Stay: Payer: Medicare HMO | Admitting: Anesthesiology

## 2015-11-11 ENCOUNTER — Inpatient Hospital Stay: Payer: Medicare HMO

## 2015-11-11 ENCOUNTER — Emergency Department: Payer: Medicare HMO

## 2015-11-11 ENCOUNTER — Encounter: Payer: Self-pay | Admitting: Emergency Medicine

## 2015-11-11 ENCOUNTER — Encounter
Admission: EM | Disposition: A | Discharge status: Skilled Nursing Facility | Payer: Self-pay | Source: Home / Self Care | Attending: Internal Medicine

## 2015-11-11 ENCOUNTER — Inpatient Hospital Stay
Admission: EM | Admit: 2015-11-11 | Discharge: 2015-11-14 | DRG: 470 | Disposition: A | Discharge status: Skilled Nursing Facility | Payer: Medicare HMO | Attending: Internal Medicine | Admitting: Internal Medicine

## 2015-11-11 DIAGNOSIS — Y92007 Garden or yard of unspecified non-institutional (private) residence as the place of occurrence of the external cause: Secondary | ICD-10-CM

## 2015-11-11 DIAGNOSIS — Z823 Family history of stroke: Secondary | ICD-10-CM | POA: Diagnosis not present

## 2015-11-11 DIAGNOSIS — Z800 Family history of malignant neoplasm of digestive organs: Secondary | ICD-10-CM

## 2015-11-11 DIAGNOSIS — W19XXXA Unspecified fall, initial encounter: Secondary | ICD-10-CM

## 2015-11-11 DIAGNOSIS — M84459A Pathological fracture, hip, unspecified, initial encounter for fracture: Secondary | ICD-10-CM | POA: Diagnosis not present

## 2015-11-11 DIAGNOSIS — Z85820 Personal history of malignant melanoma of skin: Secondary | ICD-10-CM

## 2015-11-11 DIAGNOSIS — S72009A Fracture of unspecified part of neck of unspecified femur, initial encounter for closed fracture: Secondary | ICD-10-CM | POA: Diagnosis present

## 2015-11-11 DIAGNOSIS — I10 Essential (primary) hypertension: Secondary | ICD-10-CM | POA: Diagnosis not present

## 2015-11-11 DIAGNOSIS — N390 Urinary tract infection, site not specified: Secondary | ICD-10-CM | POA: Diagnosis not present

## 2015-11-11 DIAGNOSIS — W19XXXD Unspecified fall, subsequent encounter: Secondary | ICD-10-CM | POA: Diagnosis not present

## 2015-11-11 DIAGNOSIS — E785 Hyperlipidemia, unspecified: Secondary | ICD-10-CM | POA: Diagnosis present

## 2015-11-11 DIAGNOSIS — S72002A Fracture of unspecified part of neck of left femur, initial encounter for closed fracture: Secondary | ICD-10-CM

## 2015-11-11 DIAGNOSIS — N183 Chronic kidney disease, stage 3 (moderate): Secondary | ICD-10-CM | POA: Diagnosis not present

## 2015-11-11 DIAGNOSIS — W1830XA Fall on same level, unspecified, initial encounter: Secondary | ICD-10-CM | POA: Diagnosis not present

## 2015-11-11 DIAGNOSIS — Z96649 Presence of unspecified artificial hip joint: Secondary | ICD-10-CM

## 2015-11-11 DIAGNOSIS — Z85840 Personal history of malignant neoplasm of eye: Secondary | ICD-10-CM | POA: Diagnosis not present

## 2015-11-11 DIAGNOSIS — N189 Chronic kidney disease, unspecified: Secondary | ICD-10-CM | POA: Diagnosis not present

## 2015-11-11 DIAGNOSIS — Z96642 Presence of left artificial hip joint: Secondary | ICD-10-CM | POA: Diagnosis not present

## 2015-11-11 DIAGNOSIS — R0902 Hypoxemia: Secondary | ICD-10-CM | POA: Diagnosis not present

## 2015-11-11 DIAGNOSIS — Z8249 Family history of ischemic heart disease and other diseases of the circulatory system: Secondary | ICD-10-CM

## 2015-11-11 DIAGNOSIS — Z87891 Personal history of nicotine dependence: Secondary | ICD-10-CM | POA: Diagnosis not present

## 2015-11-11 DIAGNOSIS — I129 Hypertensive chronic kidney disease with stage 1 through stage 4 chronic kidney disease, or unspecified chronic kidney disease: Secondary | ICD-10-CM | POA: Diagnosis present

## 2015-11-11 DIAGNOSIS — Z9981 Dependence on supplemental oxygen: Secondary | ICD-10-CM | POA: Diagnosis not present

## 2015-11-11 DIAGNOSIS — Z7982 Long term (current) use of aspirin: Secondary | ICD-10-CM | POA: Diagnosis not present

## 2015-11-11 DIAGNOSIS — Z79899 Other long term (current) drug therapy: Secondary | ICD-10-CM

## 2015-11-11 DIAGNOSIS — Z7401 Bed confinement status: Secondary | ICD-10-CM | POA: Diagnosis not present

## 2015-11-11 DIAGNOSIS — K219 Gastro-esophageal reflux disease without esophagitis: Secondary | ICD-10-CM | POA: Diagnosis not present

## 2015-11-11 DIAGNOSIS — Z23 Encounter for immunization: Secondary | ICD-10-CM | POA: Diagnosis not present

## 2015-11-11 DIAGNOSIS — W010XXA Fall on same level from slipping, tripping and stumbling without subsequent striking against object, initial encounter: Secondary | ICD-10-CM | POA: Diagnosis not present

## 2015-11-11 DIAGNOSIS — M25552 Pain in left hip: Secondary | ICD-10-CM | POA: Diagnosis not present

## 2015-11-11 DIAGNOSIS — R339 Retention of urine, unspecified: Secondary | ICD-10-CM | POA: Diagnosis not present

## 2015-11-11 DIAGNOSIS — S72002D Fracture of unspecified part of neck of left femur, subsequent encounter for closed fracture with routine healing: Secondary | ICD-10-CM | POA: Diagnosis not present

## 2015-11-11 HISTORY — PX: HIP ARTHROPLASTY: SHX981

## 2015-11-11 LAB — COMPREHENSIVE METABOLIC PANEL
ALK PHOS: 87 U/L (ref 38–126)
ALT: 25 U/L (ref 17–63)
AST: 25 U/L (ref 15–41)
Albumin: 3.7 g/dL (ref 3.5–5.0)
Anion gap: 7 (ref 5–15)
BUN: 27 mg/dL — ABNORMAL HIGH (ref 6–20)
CALCIUM: 9.1 mg/dL (ref 8.9–10.3)
CO2: 25 mmol/L (ref 22–32)
CREATININE: 1.19 mg/dL (ref 0.61–1.24)
Chloride: 107 mmol/L (ref 101–111)
GFR, EST NON AFRICAN AMERICAN: 53 mL/min — AB (ref >60)
Glucose, Bld: 103 mg/dL — ABNORMAL HIGH (ref 65–99)
Potassium: 4.2 mmol/L (ref 3.5–5.1)
SODIUM: 139 mmol/L (ref 135–145)
Total Bilirubin: 0.7 mg/dL (ref 0.3–1.2)
Total Protein: 6.4 g/dL — ABNORMAL LOW (ref 6.5–8.1)

## 2015-11-11 LAB — SURGICAL PCR SCREEN
MRSA, PCR: NEGATIVE
STAPHYLOCOCCUS AUREUS: NEGATIVE

## 2015-11-11 LAB — URINALYSIS COMPLETE WITH MICROSCOPIC (ARMC ONLY)
BILIRUBIN URINE: NEGATIVE
Bacteria, UA: NONE SEEN
GLUCOSE, UA: NEGATIVE mg/dL
Hgb urine dipstick: NEGATIVE
KETONES UR: NEGATIVE mg/dL
Leukocytes, UA: NEGATIVE
Nitrite: NEGATIVE
Protein, ur: NEGATIVE mg/dL
SPECIFIC GRAVITY, URINE: 1.012 (ref 1.005–1.030)
Squamous Epithelial / LPF: NONE SEEN
pH: 7 (ref 5.0–8.0)

## 2015-11-11 LAB — CBC WITH DIFFERENTIAL/PLATELET
Basophils Absolute: 0 10*3/uL (ref 0–0.1)
Basophils Relative: 0 %
Eosinophils Absolute: 0.2 10*3/uL (ref 0–0.7)
Eosinophils Relative: 2 %
HEMATOCRIT: 39.1 % — AB (ref 40.0–52.0)
HEMOGLOBIN: 13.3 g/dL (ref 13.0–18.0)
LYMPHS ABS: 1.1 10*3/uL (ref 1.0–3.6)
LYMPHS PCT: 9 %
MCH: 30.8 pg (ref 26.0–34.0)
MCHC: 34 g/dL (ref 32.0–36.0)
MCV: 90.7 fL (ref 80.0–100.0)
Monocytes Absolute: 0.5 10*3/uL (ref 0.2–1.0)
Monocytes Relative: 4 %
NEUTROS ABS: 10.7 10*3/uL — AB (ref 1.4–6.5)
NEUTROS PCT: 85 %
Platelets: 181 10*3/uL (ref 150–440)
RBC: 4.31 MIL/uL — AB (ref 4.40–5.90)
RDW: 12.8 % (ref 11.5–14.5)
WBC: 12.6 10*3/uL — AB (ref 3.8–10.6)

## 2015-11-11 LAB — TROPONIN I: Troponin I: <0.03 ng/mL (ref <0.03)

## 2015-11-11 LAB — HEMOGLOBIN AND HEMATOCRIT, BLOOD
HEMATOCRIT: 34.3 % — AB (ref 40.0–52.0)
Hemoglobin: 11.8 g/dL — ABNORMAL LOW (ref 13.0–18.0)

## 2015-11-11 SURGERY — HEMIARTHROPLASTY, HIP, DIRECT ANTERIOR APPROACH, FOR FRACTURE
Anesthesia: Spinal | Site: Hip | Laterality: Left | Wound class: Clean

## 2015-11-11 MED ORDER — NEOMYCIN-POLYMYXIN B GU 40-200000 IR SOLN
Status: AC
  Filled 2015-11-11: qty 20

## 2015-11-11 MED ORDER — SODIUM CHLORIDE 0.9 % IV SOLN
INTRAVENOUS | Status: DC | PRN
  Administered 2015-11-11: 50 mL via INTRAMUSCULAR

## 2015-11-11 MED ORDER — BISACODYL 10 MG RE SUPP: 10.0000 mg | Freq: Every day | RECTAL | Status: DC | PRN

## 2015-11-11 MED ORDER — ACETAMINOPHEN 10 MG/ML IV SOLN
INTRAVENOUS | Status: DC | PRN
  Administered 2015-11-11: 1000 mg via INTRAVENOUS

## 2015-11-11 MED ORDER — MAGNESIUM HYDROXIDE 400 MG/5ML PO SUSP
30.0000 mL | Freq: Every day | ORAL | Status: DC | PRN
  Administered 2015-11-13: 30 mL via ORAL
  Filled 2015-11-11: qty 30

## 2015-11-11 MED ORDER — ONDANSETRON HCL 4 MG/2ML IJ SOLN: 4.0000 mg | Freq: Four times a day (QID) | INTRAMUSCULAR | Status: DC | PRN

## 2015-11-11 MED ORDER — METOCLOPRAMIDE HCL 5 MG/ML IJ SOLN: 5.0000 mg | Freq: Three times a day (TID) | INTRAMUSCULAR | Status: DC | PRN

## 2015-11-11 MED ORDER — OXYCODONE HCL 5 MG PO TABS
5.0000 mg | ORAL_TABLET | ORAL | Status: DC | PRN
  Administered 2015-11-11 – 2015-11-14 (×8): 5 mg via ORAL
  Filled 2015-11-11 (×8): qty 1

## 2015-11-11 MED ORDER — PROPOFOL 500 MG/50ML IV EMUL
INTRAVENOUS | Status: DC | PRN
  Administered 2015-11-11: 25 ug/kg/min via INTRAVENOUS
  Administered 2015-11-11: 20 ug/kg/min via INTRAVENOUS

## 2015-11-11 MED ORDER — PROPOFOL 10 MG/ML IV BOLUS
INTRAVENOUS | Status: DC | PRN
  Administered 2015-11-11 (×2): 10 mg via INTRAVENOUS
  Administered 2015-11-11: 20 mg via INTRAVENOUS
  Administered 2015-11-11 (×4): 6.5 mg via INTRAVENOUS

## 2015-11-11 MED ORDER — ONDANSETRON HCL 4 MG/2ML IJ SOLN: 4.0000 mg | Freq: Once | INTRAMUSCULAR | Status: DC | PRN

## 2015-11-11 MED ORDER — CLINDAMYCIN PHOSPHATE 600 MG/50ML IV SOLN
600.0000 mg | Freq: Once | INTRAVENOUS | Status: DC
  Filled 2015-11-11: qty 50

## 2015-11-11 MED ORDER — BUPIVACAINE-EPINEPHRINE (PF) 0.25% -1:200000 IJ SOLN
INTRAMUSCULAR | Status: DC | PRN
  Administered 2015-11-11: 30 mL via PERINEURAL

## 2015-11-11 MED ORDER — ATORVASTATIN CALCIUM 20 MG PO TABS
40.0000 mg | ORAL_TABLET | Freq: Every day | ORAL | Status: DC
  Administered 2015-11-12 – 2015-11-14 (×3): 40 mg via ORAL
  Filled 2015-11-11 (×3): qty 2

## 2015-11-11 MED ORDER — KETAMINE HCL 10 MG/ML IJ SOLN
INTRAMUSCULAR | Status: DC | PRN
  Administered 2015-11-11: 25 mg via INTRAVENOUS

## 2015-11-11 MED ORDER — PHENYLEPHRINE HCL 10 MG/ML IJ SOLN
INTRAMUSCULAR | Status: AC
  Filled 2015-11-11: qty 1

## 2015-11-11 MED ORDER — FENTANYL CITRATE (PF) 100 MCG/2ML IJ SOLN: 25.0000 ug | INTRAMUSCULAR | Status: DC | PRN

## 2015-11-11 MED ORDER — BUPIVACAINE-EPINEPHRINE (PF) 0.25% -1:200000 IJ SOLN
INTRAMUSCULAR | Status: AC
  Filled 2015-11-11: qty 30

## 2015-11-11 MED ORDER — SODIUM CHLORIDE 0.9 % IV SOLN
1000.0000 mL | Freq: Once | INTRAVENOUS | Status: AC
  Administered 2015-11-11: 1000 mL via INTRAVENOUS

## 2015-11-11 MED ORDER — BUPIVACAINE LIPOSOME 1.3 % IJ SUSP
INTRAMUSCULAR | Status: AC
  Filled 2015-11-11: qty 20

## 2015-11-11 MED ORDER — TETANUS-DIPHTH-ACELL PERTUSSIS 5-2.5-18.5 LF-MCG/0.5 IM SUSP
0.5000 mL | Freq: Once | INTRAMUSCULAR | Status: AC
  Administered 2015-11-11: 0.5 mL via INTRAMUSCULAR
  Filled 2015-11-11: qty 0.5

## 2015-11-11 MED ORDER — SODIUM CHLORIDE 0.9 % IV SOLN
INTRAVENOUS | Status: DC
  Administered 2015-11-11: 15:00:00 via INTRAVENOUS

## 2015-11-11 MED ORDER — MORPHINE SULFATE (PF) 2 MG/ML IV SOLN
1.0000 mg | INTRAVENOUS | Status: DC | PRN
  Administered 2015-11-11: 1 mg via INTRAVENOUS
  Filled 2015-11-11: qty 1

## 2015-11-11 MED ORDER — OXYCODONE HCL 5 MG PO TABS: 5.0000 mg | ORAL_TABLET | ORAL | Status: DC | PRN

## 2015-11-11 MED ORDER — TRANEXAMIC ACID 1000 MG/10ML IV SOLN
INTRAVENOUS | Status: AC
  Filled 2015-11-11: qty 10

## 2015-11-11 MED ORDER — ACETAMINOPHEN 500 MG PO TABS
1000.0000 mg | ORAL_TABLET | Freq: Four times a day (QID) | ORAL | Status: AC
  Administered 2015-11-12 (×4): 1000 mg via ORAL
  Filled 2015-11-11 (×5): qty 2

## 2015-11-11 MED ORDER — HYDROMORPHONE HCL 1 MG/ML IJ SOLN: 0.5000 mg | INTRAMUSCULAR | Status: DC | PRN

## 2015-11-11 MED ORDER — DOCUSATE SODIUM 100 MG PO CAPS
100.0000 mg | ORAL_CAPSULE | Freq: Two times a day (BID) | ORAL | Status: DC
  Administered 2015-11-12 – 2015-11-14 (×4): 100 mg via ORAL
  Filled 2015-11-11 (×5): qty 1

## 2015-11-11 MED ORDER — AMLODIPINE BESYLATE 10 MG PO TABS
10.0000 mg | ORAL_TABLET | Freq: Every day | ORAL | Status: DC
  Administered 2015-11-12: 10 mg via ORAL
  Filled 2015-11-11: qty 1

## 2015-11-11 MED ORDER — ACETAMINOPHEN 325 MG PO TABS: 650.0000 mg | ORAL_TABLET | Freq: Four times a day (QID) | ORAL | Status: DC | PRN

## 2015-11-11 MED ORDER — BUPIVACAINE LIPOSOME 1.3 % IJ SUSP
INTRAMUSCULAR | Status: DC | PRN
  Administered 2015-11-11: 20 mL

## 2015-11-11 MED ORDER — PANTOPRAZOLE SODIUM 40 MG PO TBEC
40.0000 mg | DELAYED_RELEASE_TABLET | Freq: Every day | ORAL | Status: DC
  Administered 2015-11-11 – 2015-11-14 (×4): 40 mg via ORAL
  Filled 2015-11-11 (×4): qty 1

## 2015-11-11 MED ORDER — SODIUM CHLORIDE 0.9 % IV SOLN
INTRAVENOUS | Status: DC | PRN
  Administered 2015-11-11: 19:00:00 via TOPICAL

## 2015-11-11 MED ORDER — KCL IN DEXTROSE-NACL 20-5-0.9 MEQ/L-%-% IV SOLN
INTRAVENOUS | Status: DC
  Administered 2015-11-11 – 2015-11-12 (×2): via INTRAVENOUS
  Filled 2015-11-11 (×8): qty 1000

## 2015-11-11 MED ORDER — HYDROMORPHONE HCL 1 MG/ML IJ SOLN
0.5000 mg | Freq: Once | INTRAMUSCULAR | Status: AC
  Administered 2015-11-11: 0.5 mg via INTRAVENOUS
  Filled 2015-11-11: qty 1

## 2015-11-11 MED ORDER — CLINDAMYCIN PHOSPHATE 600 MG/50ML IV SOLN
600.0000 mg | Freq: Four times a day (QID) | INTRAVENOUS | Status: AC
  Administered 2015-11-12 (×3): 600 mg via INTRAVENOUS
  Filled 2015-11-11 (×3): qty 50

## 2015-11-11 MED ORDER — TRANEXAMIC ACID 1000 MG/10ML IV SOLN
INTRAVENOUS | Status: DC | PRN
  Administered 2015-11-11: 1000 mg via INTRAVENOUS

## 2015-11-11 MED ORDER — ACETAMINOPHEN 10 MG/ML IV SOLN
INTRAVENOUS | Status: AC
  Filled 2015-11-11: qty 100

## 2015-11-11 MED ORDER — NEOMYCIN-POLYMYXIN B GU 40-200000 IR SOLN
Status: DC | PRN
  Administered 2015-11-11: 6 mL

## 2015-11-11 MED ORDER — LACTATED RINGERS IV SOLN
INTRAVENOUS | Status: DC | PRN
  Administered 2015-11-11 (×3): via INTRAVENOUS

## 2015-11-11 MED ORDER — FLEET ENEMA 7-19 GM/118ML RE ENEM: 1.0000 | ENEMA | Freq: Once | RECTAL | Status: DC | PRN

## 2015-11-11 MED ORDER — ENOXAPARIN SODIUM 40 MG/0.4ML ~~LOC~~ SOLN
40.0000 mg | SUBCUTANEOUS | Status: DC
  Administered 2015-11-12 – 2015-11-14 (×3): 40 mg via SUBCUTANEOUS
  Filled 2015-11-11 (×3): qty 0.4

## 2015-11-11 MED ORDER — ACETAMINOPHEN 650 MG RE SUPP: 650.0000 mg | Freq: Four times a day (QID) | RECTAL | Status: DC | PRN

## 2015-11-11 MED ORDER — DIPHENHYDRAMINE HCL 12.5 MG/5ML PO ELIX: 12.5000 mg | ORAL_SOLUTION | ORAL | Status: DC | PRN

## 2015-11-11 MED ORDER — FENTANYL CITRATE (PF) 250 MCG/5ML IJ SOLN
INTRAMUSCULAR | Status: DC | PRN
  Administered 2015-11-11 (×2): 50 ug via INTRAVENOUS

## 2015-11-11 MED ORDER — SODIUM CHLORIDE 0.9 % IJ SOLN
INTRAMUSCULAR | Status: AC
  Filled 2015-11-11: qty 50

## 2015-11-11 MED ORDER — SENNOSIDES-DOCUSATE SODIUM 8.6-50 MG PO TABS: 1.0000 | ORAL_TABLET | Freq: Every evening | ORAL | Status: DC | PRN

## 2015-11-11 MED ORDER — ONDANSETRON HCL 4 MG PO TABS: 4.0000 mg | ORAL_TABLET | Freq: Four times a day (QID) | ORAL | Status: DC | PRN

## 2015-11-11 MED ORDER — CLINDAMYCIN PHOSPHATE 600 MG/50ML IV SOLN
INTRAVENOUS | Status: DC | PRN
  Administered 2015-11-11: 600 mg via INTRAVENOUS

## 2015-11-11 MED ORDER — SODIUM CHLORIDE 0.9 % IV SOLN
10000.0000 ug | INTRAVENOUS | Status: DC | PRN
  Administered 2015-11-11: 20 ug/min via INTRAVENOUS

## 2015-11-11 MED ORDER — METOCLOPRAMIDE HCL 10 MG PO TABS: 5.0000 mg | ORAL_TABLET | Freq: Three times a day (TID) | ORAL | Status: DC | PRN

## 2015-11-11 SURGICAL SUPPLY — 66 items
BAG DECANTER FOR FLEXI CONT (MISCELLANEOUS) ×2 IMPLANT
BLADE SAGITTAL WIDE XTHICK NO (BLADE) ×2 IMPLANT
BLADE SURG SZ20 CARB STEEL (BLADE) ×2 IMPLANT
BNDG COHESIVE 6X5 TAN STRL LF (GAUZE/BANDAGES/DRESSINGS) ×2 IMPLANT
BOWL CEMENT MIXING ADV NOZZLE (MISCELLANEOUS) ×2 IMPLANT
CANISTER SUCT 1200ML W/VALVE (MISCELLANEOUS) ×2 IMPLANT
CANISTER SUCT 3000ML (MISCELLANEOUS) ×4 IMPLANT
CAPT HIP HEMI 1 ×1 IMPLANT
CEMENT BONE 1-PACK (Cement) ×4 IMPLANT
CHLORAPREP W/TINT 26ML (MISCELLANEOUS) ×4 IMPLANT
DECANTER SPIKE VIAL GLASS SM (MISCELLANEOUS) ×4 IMPLANT
DRAPE IMP U-DRAPE 54X76 (DRAPES) ×4 IMPLANT
DRAPE INCISE 23X17 IOBAN STRL (DRAPES) ×2
DRAPE INCISE 23X17 STRL (DRAPES) IMPLANT
DRAPE INCISE IOBAN 23X17 STRL (DRAPES) ×2 IMPLANT
DRAPE INCISE IOBAN 66X60 STRL (DRAPES) ×2 IMPLANT
DRAPE SHEET LG 3/4 BI-LAMINATE (DRAPES) ×2 IMPLANT
DRAPE SURG 17X23 STRL (DRAPES) ×2 IMPLANT
DRSG OPSITE POSTOP 4X12 (GAUZE/BANDAGES/DRESSINGS) ×2 IMPLANT
DRSG OPSITE POSTOP 4X14 (GAUZE/BANDAGES/DRESSINGS) ×2 IMPLANT
ECHO BI-METRIC ×1 IMPLANT
ELECT BLADE 6.5 EXT (BLADE) ×2 IMPLANT
ELECT CAUTERY BLADE 6.4 (BLADE) ×2 IMPLANT
ELECT REM PT RETURN 9FT ADLT (ELECTROSURGICAL) ×2
ELECTRODE REM PT RTRN 9FT ADLT (ELECTROSURGICAL) ×1 IMPLANT
GAUZE PACK 2X3YD (MISCELLANEOUS) ×2 IMPLANT
GAUZE PACKING 1/2X5YD (GAUZE/BANDAGES/DRESSINGS) ×1 IMPLANT
GLOVE BIO SURGEON STRL SZ 6.5 (GLOVE) ×3 IMPLANT
GLOVE BIO SURGEON STRL SZ8 (GLOVE) ×8 IMPLANT
GLOVE INDICATOR 8.0 STRL GRN (GLOVE) ×4 IMPLANT
GOWN STRL REUS W/ TWL LRG LVL3 (GOWN DISPOSABLE) ×1 IMPLANT
GOWN STRL REUS W/ TWL XL LVL3 (GOWN DISPOSABLE) ×1 IMPLANT
GOWN STRL REUS W/TWL LRG LVL3 (GOWN DISPOSABLE) ×8
GOWN STRL REUS W/TWL XL LVL3 (GOWN DISPOSABLE) ×2
HANDPIECE INTERPULSE COAX TIP (DISPOSABLE) ×2
HOOD PEEL AWAY FLYTE STAYCOOL (MISCELLANEOUS) ×7 IMPLANT
IV NS 100ML SINGLE PACK (IV SOLUTION) ×1 IMPLANT
NDL FILTER BLUNT 18X1 1/2 (NEEDLE) ×1 IMPLANT
NDL SAFETY 18GX1.5 (NEEDLE) ×2 IMPLANT
NDL SPNL 20GX3.5 QUINCKE YW (NEEDLE) ×1 IMPLANT
NEEDLE FILTER BLUNT 18X 1/2SAF (NEEDLE) ×1
NEEDLE FILTER BLUNT 18X1 1/2 (NEEDLE) ×1 IMPLANT
NEEDLE SPNL 20GX3.5 QUINCKE YW (NEEDLE) ×2 IMPLANT
NS IRRIG 1000ML POUR BTL (IV SOLUTION) ×2 IMPLANT
PACK HIP PROSTHESIS (MISCELLANEOUS) ×2 IMPLANT
PILLOW ABDUC SM (MISCELLANEOUS) ×2 IMPLANT
SET HNDPC FAN SPRY TIP SCT (DISPOSABLE) ×1 IMPLANT
SIMPLEX CEMENT ×2 IMPLANT
SOL .9 NS 3000ML IRR  AL (IV SOLUTION) ×2
SOL .9 NS 3000ML IRR AL (IV SOLUTION) ×2
SOL .9 NS 3000ML IRR UROMATIC (IV SOLUTION) ×2 IMPLANT
STAPLER SKIN PROX 35W (STAPLE) ×2 IMPLANT
STEM COLLARLESS FULL 11X135 (Stem) ×1 IMPLANT
STRAP SAFETY BODY (MISCELLANEOUS) ×2 IMPLANT
SUT ETHIBOND 2 V 37 (SUTURE) ×4 IMPLANT
SUT ETHIBOND CT1 BRD #0 30IN (SUTURE) ×2 IMPLANT
SUT FIBERWIRE #2 38 T-5 BLUE (SUTURE) ×2
SUT QUILL PDO 2 24X24 VLT (SUTURE) ×2 IMPLANT
SUT VIC AB 1 CT1 36 (SUTURE) ×2 IMPLANT
SUT VIC AB 2-0 CT1 27 (SUTURE) ×4
SUT VIC AB 2-0 CT1 TAPERPNT 27 (SUTURE) ×2 IMPLANT
SUTURE FIBERWR #2 38 T-5 BLUE (SUTURE) ×1 IMPLANT
SYR 30ML LL (SYRINGE) ×4 IMPLANT
SYR TB 1ML 27GX1/2 LL (SYRINGE) ×2 IMPLANT
SYRINGE 10CC LL (SYRINGE) ×3 IMPLANT
TAPE TRANSPORE STRL 2 31045 (GAUZE/BANDAGES/DRESSINGS) ×2 IMPLANT

## 2015-11-11 NOTE — ED Notes (Signed)
Pt arrived via EMS from home for reports of fall while outside working in yard. Pt states tripped over sidewalk and landed in grass. Denies hitting head or LOC. Pt reports left hip pain. EMS reports initial BP 90/57, after 300 mL NS, BP 120/.

## 2015-11-11 NOTE — Progress Notes (Signed)
Consents signed, unable to place foley as the transporter was in the room as I was getting the consent signed. Pt did void prior to leaving for OR.

## 2015-11-11 NOTE — Transfer of Care (Signed)
Immediate Anesthesia Transfer of Care Note  Patient: Matthew [name]  Procedure(s) Performed: Procedure(s): ARTHROPLASTY BIPOLAR HIP (HEMIARTHROPLASTY) (Left)  Patient Location: PACU  Anesthesia Type:Spinal  Level of Consciousness: awake and patient cooperative  Airway & Oxygen Therapy: Patient Spontanous Breathing and Patient connected to nasal cannula oxygen  Post-op Assessment: Report given to RN and Post -op Vital signs reviewed and stable  Post vital signs: Reviewed and stable  Last Vitals:  Filed Vitals:   11/11/15 1600 11/11/15 1605  BP: 117/57   Pulse: 102 103  Temp: 36.5 C   Resp: 18     Last Pain:  Filed Vitals:   11/11/15 1605  PainSc: 6          Complications: No apparent anesthesia complications

## 2015-11-11 NOTE — Consult Note (Signed)
ORTHOPAEDIC CONSULTATION  REQUESTING PHYSICIAN: Fritzi Mandes, MD  Chief Complaint:   Left hip pain.  History of Present Illness: Matthew [name] is a 82 y.o. male with a history of hypertension, renal insufficiency, and melanoma who lives independently. Apparently he was spraying some weedkiller on his property when he lost his balance and fell onto his left hip. He is unable to get up. Once he was able to get the attention of someone, he was brought to the emergency room where x-rays demonstrated a displaced left femoral neck fracture. The patient denies any associated injuries. He did not strike his head or lose consciousness. He also denies any lightheadedness, dizziness, chest pain, shortness breath, or other symptoms that may have precipitated his fall. He has not had any prior injury to the left hip and does not have any numbness or paresthesias down his leg.  Past Medical History  Diagnosis Date  . Allergic rhinitis   . History of transfusion of whole blood   . Melanoma ([place])     history of, by right eye  . Hypertension   . Renal insufficiency   . Chronic kidney disease     stage 3, [place] benign appearing cysts 05/2006  . Pulmonary nodules     being obs with serial CT, 6 month f/u stable 10/05  . Chronic constipation   . History of CT scan of chest 6/09    calcification consistent with asbestos exp, also pleural nodule, incidental gallstones   Past Surgical History  Procedure Laterality Date  . Ganglion cyst excision      hands  . 2d echo  6/09    slight thickening of aortic valve with EF of 60%   Social History   Social History  . Marital Status: Married    Spouse Name: N/A  . Number of Children: N/A  . Years of Education: N/A   Social History Main Topics  . Smoking status: Former Research scientist (life sciences)  . Smokeless tobacco: None     Comment: Quit smoking in 1957.  Marland Kitchen Alcohol Use: No  . Drug Use: No  . Sexual Activity:  Not Asked   Other Topics Concern  . None   Social History Narrative   Plays music with gospel/ bluegrass group   Family History  Problem Relation Age of Onset  . Hypertension Mother   . Hypertension Father   . Stroke Father   . Cancer Brother     pancreatic   Allergies  Allergen Reactions  . Chlorthalidone     REACTION: frequent urination  . Codeine     REACTION: nausea  . Iodine     REACTION: caused bubble to come up in eye  . Penicillins     REACTION: GI  . Ramipril     REACTION: cough  . Ropinirole Hydrochloride     REACTION: insomnia   Prior to Admission medications   Medication Sig Start Date End Date Taking? Authorizing Provider  amLODipine (NORVASC) 10 MG tablet Take 10 mg by mouth daily.   Yes Historical Provider, MD  aspirin 81 MG tablet Take 81 mg by mouth daily.     Yes Historical Provider, MD  atorvastatin (LIPITOR) 40 MG tablet Take 20 mg by mouth daily.    Yes Historical Provider, MD   Dg Hip Unilat W Or W/o Pelvis 2-3 Views Left  11/11/2015  CLINICAL DATA:  Acute left hip pain after fall today. EXAM: DG HIP (WITH OR WITHOUT PELVIS) 2-3V LEFT COMPARISON:  None. FINDINGS: Moderately  displaced fracture involving the proximal left femoral neck is noted. Femoral head is situated within the acetabulum. No significant joint space narrowing is noted. Right hip appears normal. IMPRESSION: Moderately displaced proximal left femoral neck fracture. Electronically Signed   By: Marijo Conception, M.D.   On: 11/11/2015 12:00    Positive ROS: All other systems have been reviewed and were otherwise negative with the exception of those mentioned in the HPI and as above.  Physical Exam: General:  Alert, no acute distress Psychiatric:  Patient is competent for consent with normal mood and affect   Cardiovascular:  No pedal edema Respiratory:  No wheezing, non-labored breathing GI:  Abdomen is soft and non-tender Skin:  No lesions in the area of chief complaint Neurologic:   Sensation intact distally Lymphatic:  No axillary or cervical lymphadenopathy  Orthopedic Exam:  Orthopedic examination is limited left hip and lower extremity. Skin inspection of the left hip is unremarkable. There is no swelling, erythema, ecchymosis, or abrasions. The left lower extremity is slightly shortened and externally rotated as compared to the right. He has mild tenderness to palpation over the lateral aspect of the left hip. This pain is reproduced with any attempted active or passive motion of the left hip. He is able to actively dorsiflex and plantarflex his toes and ankle. Sensation is intact to light touch to all distributions. He has good capillary refill to his left foot.  X-rays:  X-rays of the pelvis and left hip are available for review. These films demonstrate a varus angulated left femoral neck fracture. No significant degenerative changes are noted. No obvious lytic processes or other bony abnormalities are identified.  Assessment: Displaced left femoral neck fracture.  Plan: The treatment options were discussed with the patient and his family, who is at the bedside with him, including both surgical and nonsurgical options. The patient would like to proceed with a left hip hemiarthroplasty. This procedure has been discussed in detail, as have the potential risks (including bleeding, infection, nerve and/or blood vessel injury, persistent or recurrent pain, loosening of and/or failure of the components, leg length inequality, dislocation, need for further surgery, blood clots, strokes, heart attacks and/or arrhythmias, etc.) and benefits. The patient states understanding and wishes to proceed. A formal written consent will be obtained by the nursing staff.  Thank you for asking me to produce pain in the care of this most pleasant man. I will be happy to follow him with you.   Pascal Lux, MD  Beeper #:  [phone]  11/11/2015 5:26 PM

## 2015-11-11 NOTE — ED Notes (Signed)
Pt arrived via EMS from home for reports of tripping on sidewalk while working in the yard. Pt reports left hip pain. Denies hitting head, denies LOC.

## 2015-11-11 NOTE — ED Provider Notes (Addendum)
[facility] Emergency Department Provider Note        Time seen: ----------------------------------------- 11:05 AM on 11/11/2015 -----------------------------------------    I have reviewed the triage vital signs and the nursing notes.   HISTORY  Chief Complaint No chief complaint on file.    HPI Matthew [name] is a 82 y.o. male who presents to ER after mechanical fall in his yard. Patient was out doing yardwork when he tripped and fell. He is complaining of left hip pain. He could not walk due to left hip pain after the fall. EMS reports he was somewhat hypotensive with a blood pressure around 90 systolic. He does not complain of anything other than hip pain currently. Patient thinks he may have been laying out there for an hour before he could get someone's attention.   Past Medical History  Diagnosis Date  . Allergic rhinitis   . History of transfusion of whole blood   . Melanoma ([place])     history of, by right eye  . Hypertension   . Renal insufficiency   . Chronic kidney disease     stage 3, [place] benign appearing cysts 05/2006  . Pulmonary nodules     being obs with serial CT, 6 month f/u stable 10/05  . Chronic constipation   . History of CT scan of chest 6/09    calcification consistent with asbestos exp, also pleural nodule, incidental gallstones    Patient Active Problem List   Diagnosis Date Noted  . Encounter for Medicare annual wellness exam 07/09/2013  . Fatigue 07/09/2013  . Hx laparoscopic cholecystectomy 05/16/2013  . Prostate cancer screening 06/04/2011  . CONSTIPATION, CHRONIC 01/12/2010  . Nonspecific (abnormal) findings on radiological and other examination of body structure 10/07/2007  . ALLERGIC CONJUNCTIVITIS 10/04/2007  . EDEMA 10/04/2007  . HYPERCHOLESTEROLEMIA 08/10/2006  . RESTLESS LEG SYNDROME 08/10/2006  . Essential hypertension 08/10/2006  . ALLERGIC RHINITIS 08/10/2006  . Renal insufficiency 08/10/2006  .  SKIN CANCER, HX OF 08/10/2006    Past Surgical History  Procedure Laterality Date  . Ganglion cyst excision      hands  . 2d echo  6/09    slight thickening of aortic valve with EF of 60%    Allergies Chlorthalidone; Codeine; Iodine; Penicillins; Ramipril; and Ropinirole hydrochloride  Social History Social History  Substance Use Topics  . Smoking status: Former Research scientist (life sciences)  . Smokeless tobacco: Not on file     Comment: Quit smoking in 1957.  Marland Kitchen Alcohol Use: No    Review of Systems Constitutional: Negative for fever. Cardiovascular: Negative for chest pain. Respiratory: Negative for shortness of breath. Gastrointestinal: Negative for abdominal pain, vomiting and diarrhea. Genitourinary: Negative for dysuria. Musculoskeletal: Positive for hip pain Skin: Positive for skin tear right elbow Neurological: Negative for headaches, focal weakness or numbness.  10-point ROS otherwise negative.  ____________________________________________   PHYSICAL EXAM:  VITAL SIGNS: ED Triage Vitals  Enc Vitals Group     BP --      Pulse --      Resp --      Temp --      Temp src --      SpO2 --      Weight --      Height --      Head Cir --      Peak Flow --      Pain Score --      Pain Loc --      Pain Edu? --  Excl. in [place]? --     Constitutional: Alert and oriented. Well appearing and in no distress. Eyes: Conjunctivae are normal.  Normal extraocular movements. ENT   Head: Normocephalic and atraumatic.   Nose: No congestion/rhinnorhea.   Mouth/Throat: Mucous membranes are moist.   Neck: No stridor. Cardiovascular: Normal rate, regular rhythm. No murmurs, rubs, or gallops. Respiratory: Normal respiratory effort without tachypnea nor retractions. Breath sounds are clear and equal bilaterally. No wheezes/rales/rhonchi. Gastrointestinal: Soft and nontender. Normal bowel sounds Musculoskeletal: Mild pain with range of motion of his left hip, left hip focally  tender to touch. Neurologic:  Normal speech and language. No gross focal neurologic deficits are appreciated.  Skin:  Skin is warm, dry ; skin tears noted to the right elbow Psychiatric: Mood and affect are normal. Speech and behavior are normal.  ____________________________________________  EKG: Interpreted by me. Sinus rhythm with a rate of 93 bpm, first-degree AV block, normal QRS, normal QT intervals. Normal axis.  ____________________________________________  ED COURSE:  Pertinent labs & imaging results that were available during my care of the patient were reviewed by me and considered in my medical decision making (see chart for details). Patient presents to the ER after mechanical fall. We will check basic labs, x-rays ____________________________________________    LABS (pertinent positives/negatives)  Labs Reviewed  CBC WITH DIFFERENTIAL/PLATELET - Abnormal; Notable for the following:    WBC 12.6 (*)    RBC 4.31 (*)    HCT 39.1 (*)    Neutro Abs 10.7 (*)    All other components within normal limits  COMPREHENSIVE METABOLIC PANEL  TROPONIN I  URINALYSIS COMPLETEWITH MICROSCOPIC ([facility] ONLY)    RADIOLOGY Images were viewed by me  Left hip x-ray Reveal left femoral neck fracture IMPRESSION: Moderately displaced proximal left femoral neck fracture. ____________________________________________  FINAL ASSESSMENT AND PLAN  Fall, left femoral neck fracture  Plan: Patient with labs and imaging as dictated above. Patient presented to the ER after mechanical fall in his yard with resulting femoral neck fracture. I have discussed this with the patient, Patient has been discussed with Dr. Roland Rack and will be admitted by the hospitalist service.   Earleen Newport, MD   Note: This dictation was prepared with Dragon dictation. Any transcriptional errors that result from this process are unintentional   Earleen Newport, MD 11/11/15 1214  Earleen Newport,  MD 11/11/15 [phone]

## 2015-11-11 NOTE — Op Note (Signed)
11/11/2015  8:22 PM  Patient:   Matthew [name]  Pre-Op Diagnosis:   Displaced femoral neck fracture, left hip.  Post-Op Diagnosis:   Same.  Procedure:   Cemented left hip unipolar hemiarthroplasty.  Surgeon:   Pascal Lux, MD  Assistant:   Cameron Proud, PA-C  Anesthesia:   Spinal  Findings:   As above.  Complications:   None  EBL:   600 cc  Fluids:   2000 cc crystalloid  UOP:   50 cc  TT:   None  Drains:   None  Closure:   Staples  Implants:   Biomet press-fit system with a #9 cemented femoral stem, a 50 mm outer diameter shell, and a -3 mm neck  Brief Clinical Note:   The patient is an 82 year old male who sustained the above-noted injury this morning when he apparently lost his balance and fell while doing some yard work, landing on his left hip. He was brought to the emergency room where x-rays demonstrated the above-noted fracture. He has been cleared medically and presents at this time for definitive management of the injury.  Procedure:   The patient was brought into the operating room. After adequate spinal anesthesia was obtained, the patient was repositioned in the right lateral decubitus position and secured using a lateral hip positioner. The left hip and lower extremity were prepped with ChloroPrep solution before being draped sterilely. Preoperative antibiotics were administered. A timeout was performed to verify the appropriate surgical site before a standard posterior approach to the hip was made through an approximately 4-5 inch incision. The incision was carried down through the subcutaneous tissues to expose the gluteal fascia and proximal end of the iliotibial band. These structures were split the length of the incision and the Charnley self-retaining hip retractor placed. The bursal tissues were swept posteriorly to expose the short external rotators. The anterior border of the piriformis tendon was identified and this plane developed down through the  capsule to enter the joint. Abundant fracture hematoma was suctioned. A flap of tissue was elevated off the posterior aspect of the femoral neck and greater trochanter and retracted posteriorly. This flap included the piriformis tendon, the short external rotators, and the posterior capsule. The femoral head was removed in its entirety, then taken to the back table where it was measured and found to be optimally replicated by a 50 mm head. The appropriate trial head was inserted and found to demonstrate an excellent suction fit.   Attention was directed to the femoral side. The femoral neck was recut 10-12 mm above the lesser trochanter using an oscillating saw. The piriformis fossa was debrided of soft tissues before the intramedullary canal was accessed through this point using a triple step reamer. The canal was reamed sequentially beginning with a #7 tapered reamer and progressing to a #13 tapered reamer. This provided excellent circumferential chatter. A box osteotome was used to establish version before the canal was broached sequentially beginning with a #9 broach and progressing to a #11 broach. This broach seemed to fit quite tightly proximally so it was elected to stop at this point. The #11 broach was left in place and several trial reductions performed. However, during the trial reductions, it was clear that the #11 stem was loose. However, there was no more room in the calcar to accept a larger press-fit stem without risking calcar fracture, so it was elected to proceed with cementing in a #9 cemented femoral stem. The distal canal was measured  and found to accept a #4 cement restrictor. This was placed in sufficiently distal so as to accommodate the length of the femoral stem. The canal was prepared for cementing by irrigating the canal thoroughly with pulse lavage irrigation, then packing the canal with a vaginal pack soaked in a dilute Neo-Synephrine solution. Meanwhile, the cement was mixed on the  back table. When it was ready, the femoral canal cement was introduced using a cement gun under pressure before the femoral stem was inserted with care taken to maintain the appropriate version. Once the cement hardened, a trial reduction was performed using the -6 mm and -3 mm neck lengths. The -3 mm neck length demonstrated excellent stability both in extension and external rotation as well as with flexion to 90 and internal rotation beyond 70. It also was stable in the position of sleep. The 50 mm outer diameter shell with the -3 mm neck adapter construct was put together on the back table before being impacted onto the stem of the femoral component. The Morse taper locking mechanism was verified using manual distraction before the head was relocated and placed through a range of motion with the findings as described above.  The wound was copiously irrigated with sterile saline solution via the jet lavage system before the peri-incisional and pericapsular tissues were injected with 30 cc of 0.5% Sensorcaine with epinephrine and 20 cc of Exparel diluted out to 60 cc with normal saline to help with postoperative analgesia. The posterior flap was reapproximated to the posterior aspect of the greater trochanter using #2 Tycron interrupted sutures placed through drill holes. Several additional #2 Tycron interrupted sutures were used to reinforce this layer of closure. The iliotibial band was reapproximated using #1 Vicryl interrupted sutures before the gluteal fascia was closed using a running #1 Vicryl suture. At this point, 1 g of transexemic acid in 10 cc of normal saline was injected into the joint to help reduce postoperative bleeding. The subcutaneous tissues were closed in several layers using 2-0 Vicryl interrupted sutures before the skin was closed using staples. A sterile occlusive dressing was applied to the wound before the patient was placed into an abduction wedge pillow. The patient was then rolled  back into the supine position on the hospital bed before being awakened and returned to the recovery room in satisfactory condition after tolerating the procedure well.

## 2015-11-11 NOTE — H&P (Signed)
Matthew [name] at [place] NAME: Matthew [name]    MR#:  [id number]  DATE OF BIRTH:  1924/07/17  DATE OF ADMISSION:  11/11/2015  PRIMARY CARE PHYSICIAN: Loura Pardon, MD   REQUESTING/REFERRING PHYSICIAN: dr Jimmye Norman  CHIEF COMPLAINT:   Mechanical fall and left hip pain HISTORY OF PRESENT ILLNESS:  Matthew [name]  is a 82 y.o. male with a known history of  hypertension, chronic kidney disease stage III, history of melanoma right eye comes to the emergency room after he had a mechanical fall while doing yard work this morning. Patient remembers laying down for about half an hour until he got attention brought to the emergency room was found to have left femoral fracture. Patient denies any chest pain shortness of breath or any dizziness or any weakness. He does have left hip pain. He is being admitted for further evaluation of management of his left hip fracture. Patient does not have any cardiac history or any history of MI unknown to him.  PAST MEDICAL HISTORY:   Past Medical History  Diagnosis Date  . Allergic rhinitis   . History of transfusion of whole blood   . Melanoma ([place])     history of, by right eye  . Hypertension   . Renal insufficiency   . Chronic kidney disease     stage 3, [place] benign appearing cysts 05/2006  . Pulmonary nodules     being obs with serial CT, 6 month f/u stable 10/05  . Chronic constipation   . History of CT scan of chest 6/09    calcification consistent with asbestos exp, also pleural nodule, incidental gallstones    PAST SURGICAL HISTOIRY:   Past Surgical History  Procedure Laterality Date  . Ganglion cyst excision      hands  . 2d echo  6/09    slight thickening of aortic valve with EF of 60%    SOCIAL HISTORY:   Social History  Substance Use Topics  . Smoking status: Former Research scientist (life sciences)  . Smokeless tobacco: Not on file     Comment: Quit smoking in 1957.  Marland Kitchen Alcohol Use: No    FAMILY HISTORY:    Family History  Problem Relation Age of Onset  . Hypertension Mother   . Hypertension Father   . Stroke Father   . Cancer Brother     pancreatic    DRUG ALLERGIES:   Allergies  Allergen Reactions  . Chlorthalidone     REACTION: frequent urination  . Codeine     REACTION: nausea  . Iodine     REACTION: caused bubble to come up in eye  . Penicillins     REACTION: GI  . Ramipril     REACTION: cough  . Ropinirole Hydrochloride     REACTION: insomnia    REVIEW OF SYSTEMS:  Review of Systems  Constitutional: Negative for fever, chills and weight loss.  HENT: Negative for ear discharge, ear pain and nosebleeds.   Eyes: Negative for blurred vision, pain and discharge.  Respiratory: Negative for sputum production, shortness of breath, wheezing and stridor.   Cardiovascular: Negative for chest pain, palpitations, orthopnea and PND.  Gastrointestinal: Negative for nausea, vomiting, abdominal pain and diarrhea.  Genitourinary: Negative for urgency and frequency.  Musculoskeletal: Positive for joint pain and falls. Negative for back pain.  Neurological: Positive for weakness. Negative for sensory change, speech change and focal weakness.  Psychiatric/Behavioral: Negative for depression and hallucinations. The patient is not  nervous/anxious.   All other systems reviewed and are negative.    MEDICATIONS AT HOME:   Prior to Admission medications   Medication Sig Start Date End Date Taking? Authorizing Provider  amLODipine (NORVASC) 10 MG tablet Take 10 mg by mouth daily.   Yes Historical Provider, MD  aspirin 81 MG tablet Take 81 mg by mouth daily.     Yes Historical Provider, MD  atorvastatin (LIPITOR) 40 MG tablet Take 40 mg by mouth daily.   Yes Historical Provider, MD      VITAL SIGNS:  Blood pressure 141/71, pulse 93, temperature 98 F (36.7 C), temperature source Oral, resp. rate 16, weight 66.679 kg (147 lb), SpO2 91 %.  PHYSICAL EXAMINATION:  GENERAL:  82  y.o.-year-old patient lying in the bed with no acute distress.  EYES: Pupils equal, round, reactive to light and accommodation. No scleral icterus. Extraocular muscles intact.  HEENT: Head atraumatic, normocephalic. Oropharynx and nasopharynx clear.  NECK:  Supple, no jugular venous distention. No thyroid enlargement, no tenderness.  LUNGS: Normal breath sounds bilaterally, no wheezing, rales,rhonchi or crepitation. No use of accessory muscles of respiration.  CARDIOVASCULAR: S1, S2 normal. No murmurs, rubs, or gallops.  ABDOMEN: Soft, nontender, nondistended. Bowel sounds present. No organomegaly or mass.  EXTREMITIES: No pedal edema, cyanosis, or clubbing. Left hip decreased range of motion. NEUROLOGIC: Cranial nerves II through XII are intact. Muscle strength 5/5 in all extremities. Sensation intact. Gait not checked.  PSYCHIATRIC: The patient is alert and oriented x 3.  SKIN: No obvious rash, lesion, or ulcer.   LABORATORY PANEL:   CBC  Recent Labs Lab 11/11/15 1152  WBC 12.6*  HGB 13.3  HCT 39.1*  PLT 181   ------------------------------------------------------------------------------------------------------------------  Chemistries   Recent Labs Lab 11/11/15 1152  NA 139  K 4.2  CL 107  CO2 25  GLUCOSE 103*  BUN 27*  CREATININE 1.19  CALCIUM 9.1  AST 25  ALT 25  ALKPHOS 87  BILITOT 0.7   ------------------------------------------------------------------------------------------------------------------  Cardiac Enzymes  Recent Labs Lab 11/11/15 1152  TROPONINI <0.03   ------------------------------------------------------------------------------------------------------------------  RADIOLOGY:  Dg Hip Unilat W Or W/o Pelvis 2-3 Views Left  11/11/2015  CLINICAL DATA:  Acute left hip pain after fall today. EXAM: DG HIP (WITH OR WITHOUT PELVIS) 2-3V LEFT COMPARISON:  None. FINDINGS: Moderately displaced fracture involving the proximal left femoral neck is  noted. Femoral head is situated within the acetabulum. No significant joint space narrowing is noted. Right hip appears normal. IMPRESSION: Moderately displaced proximal left femoral neck fracture. Electronically Signed   By: Marijo Conception, M.D.   On: 11/11/2015 12:00    EKG:  Normal sinus rhythm. Prolonged PR interval. No acute ST elevation or depression.   IMPRESSION AND PLAN:    Lewey Cheeves  is a 82 y.o. male with a known history of  hypertension, chronic kidney disease stage III, history of melanoma right eye comes to the emergency room after he had a mechanical fall while doing yard work this morning. Patient remembers laying down for about half an hour until he got attention brought to the emergency room was found to have left femoral fracture. Patient denies any chest pain shortness of breath or any dizziness or any weakness.  1. Left hip fracture status post mechanical fall. -Patient has functional, no chest pain no cardiac history and is at a low to intermediate risk for surgery. Patient is okay to proceed for surgery. -Orthopedic consultation placed. -Hold off on aspirin. DVT prophylaxis  after surgery.  2. Hypertension -Stable. Continue amlodipine -Resume aspirin after surgery  3. Hyperlipidemia -Continue statins  4. DVT prophylaxis -Will be initiated after hip surgery.  Above was discussed with patient and patient's brother and sister present in the emergency room. Questions answered. Risks and benefits along with complications of surgery for discussed.  All the records are reviewed and case discussed with ED provider. Management plans discussed with the patient, family and they are in agreement.  CODE STATUS: full  TOTAL TIME TAKING CARE OF THIS PATIENT: 40 minutes.    Teisha Trowbridge M.D on 11/11/2015 at 12:49 PM  Between 7am to 6pm - Pager - [phone]  After 6pm go to [email or web address] - password [email or web address] [facility]  [facility]  Office   [phone]  CC: Primary care physician; Loura Pardon, MD

## 2015-11-11 NOTE — ED Notes (Signed)
Admitting MD at bedside.

## 2015-11-11 NOTE — Anesthesia Preprocedure Evaluation (Addendum)
Anesthesia Evaluation  Patient identified by MRN, date of birth, ID band Patient awake    Reviewed: Allergy & Precautions, NPO status , Patient's Chart, lab work & pertinent test results  Airway Mallampati: II  TM Distance: >3 FB     Dental  (+) Missing, Caps, Chipped   Pulmonary former smoker,    Pulmonary exam normal        Cardiovascular hypertension, Pt. on medications Normal cardiovascular exam     Neuro/Psych negative neurological ROS  negative psych ROS   GI/Hepatic negative GI ROS, Neg liver ROS,   Endo/Other  negative endocrine ROS  Renal/GU Renal InsufficiencyRenal disease  negative genitourinary   Musculoskeletal Fx of Hip   Abdominal Normal abdominal exam  (+)   Peds negative pediatric ROS (+)  Hematology negative hematology ROS (+)   Anesthesia Other Findings   Reproductive/Obstetrics                            Anesthesia Physical Anesthesia Plan  ASA: III  Anesthesia Plan: Spinal   Post-op Pain Management:    Induction: Intravenous  Airway Management Planned: Nasal Cannula  Additional Equipment:   Intra-op Plan:   Post-operative Plan:   Informed Consent: I have reviewed the patients History and Physical, chart, labs and discussed the procedure including the risks, benefits and alternatives for the proposed anesthesia with the patient or authorized representative who has indicated his/her understanding and acceptance.   Dental advisory given  Plan Discussed with: CRNA and Surgeon  Anesthesia Plan Comments:         Anesthesia Quick Evaluation

## 2015-11-12 ENCOUNTER — Encounter: Payer: Self-pay | Admitting: Surgery

## 2015-11-12 DIAGNOSIS — Z96649 Presence of unspecified artificial hip joint: Secondary | ICD-10-CM | POA: Insufficient documentation

## 2015-11-12 LAB — CBC WITH DIFFERENTIAL/PLATELET
Basophils Absolute: 0 10*3/uL (ref 0–0.1)
Basophils Relative: 0 %
EOS PCT: 3 %
Eosinophils Absolute: 0.4 10*3/uL (ref 0–0.7)
HEMATOCRIT: 30.4 % — AB (ref 40.0–52.0)
Hemoglobin: 10.4 g/dL — ABNORMAL LOW (ref 13.0–18.0)
LYMPHS ABS: 1.5 10*3/uL (ref 1.0–3.6)
LYMPHS PCT: 11 %
MCH: 31.1 pg (ref 26.0–34.0)
MCHC: 34.4 g/dL (ref 32.0–36.0)
MCV: 90.6 fL (ref 80.0–100.0)
Monocytes Absolute: 0.6 10*3/uL (ref 0.2–1.0)
Monocytes Relative: 4 %
Neutro Abs: 11 10*3/uL — ABNORMAL HIGH (ref 1.4–6.5)
Neutrophils Relative %: 82 %
PLATELETS: 158 10*3/uL (ref 150–440)
RBC: 3.35 MIL/uL — ABNORMAL LOW (ref 4.40–5.90)
RDW: 13.3 % (ref 11.5–14.5)
WBC: 13.5 10*3/uL — ABNORMAL HIGH (ref 3.8–10.6)

## 2015-11-12 LAB — BASIC METABOLIC PANEL
Anion gap: 5 (ref 5–15)
BUN: 28 mg/dL — ABNORMAL HIGH (ref 6–20)
CALCIUM: 8 mg/dL — AB (ref 8.9–10.3)
CO2: 23 mmol/L (ref 22–32)
Chloride: 106 mmol/L (ref 101–111)
Creatinine, Ser: 1.22 mg/dL (ref 0.61–1.24)
GFR calc Af Amer: 59 mL/min — ABNORMAL LOW (ref >60)
GFR calc non Af Amer: 51 mL/min — ABNORMAL LOW (ref >60)
GLUCOSE: 118 mg/dL — AB (ref 65–99)
Potassium: 4.6 mmol/L (ref 3.5–5.1)
Sodium: 134 mmol/L — ABNORMAL LOW (ref 135–145)

## 2015-11-12 MED ORDER — PANTOPRAZOLE SODIUM 40 MG PO TBEC: 40.0000 mg | DELAYED_RELEASE_TABLET | Freq: Every day | ORAL | Status: AC

## 2015-11-12 MED ORDER — DOCUSATE SODIUM 100 MG PO CAPS: 100.0000 mg | ORAL_CAPSULE | Freq: Two times a day (BID) | ORAL | Status: DC

## 2015-11-12 MED ORDER — CALCIUM CITRATE-VITAMIN D 500-400 MG-UNIT PO CHEW
1.0000 | CHEWABLE_TABLET | Freq: Two times a day (BID) | ORAL | Status: DC
  Administered 2015-11-12 – 2015-11-14 (×5): 1 via ORAL
  Filled 2015-11-12 (×5): qty 1

## 2015-11-12 MED ORDER — OXYCODONE HCL 5 MG PO TABS: 5.0000 mg | ORAL_TABLET | ORAL | Status: DC | PRN

## 2015-11-12 NOTE — Evaluation (Signed)
Clinical/Bedside Swallow Evaluation Patient Details  Name: Matthew [name] MRN: [id number] Date of Birth: 1924/07/14  Today's Date: 11/12/2015 Time: SLP Start Time (ACUTE ONLY): 1115 SLP Stop Time (ACUTE ONLY): 1200 SLP Time Calculation (min) (ACUTE ONLY): 45 min  Past Medical History:  Past Medical History  Diagnosis Date  . Allergic rhinitis   . History of transfusion of whole blood   . Melanoma ([place])     history of, by right eye  . Hypertension   . Renal insufficiency   . Chronic kidney disease     stage 3, [place] benign appearing cysts 05/2006  . Pulmonary nodules     being obs with serial CT, 6 month f/u stable 10/05  . Chronic constipation   . History of CT scan of chest 6/09    calcification consistent with asbestos exp, also pleural nodule, incidental gallstones   Past Surgical History:  Past Surgical History  Procedure Laterality Date  . Ganglion cyst excision      hands  . 2d echo  6/09    slight thickening of aortic valve with EF of 60%   HPI:  Matthew [name] is a 82 y.o. male with a known history of hypertension, chronic kidney disease stage III, history of melanoma right eye comes to the emergency room after he had a mechanical fall while doing yard work this morning. Patient remembers laying down for about half an hour until he got attention brought to the emergency room was found to have left femoral fracture. Pt noted with coughing on thin liquids by nursing and order initiated for Bedside Swallow Evaluation. Current diet is full liquid d/t recent hip surgery.    Assessment / Plan / Recommendation Clinical Impression  Pt apperas ar reduced aspiartion risk following general aspiration precuation while consuming regular diet with thin liquids. Pt with timely swallow intiation and clear voice when consuming thin liquids via spon, cup and straw. Pt with 1 mild throat clear and he states that he "gets choked on water ~2 times per month." Pt with no recent history of  respiratory decline. Pt's voice remained clear throughout. Pt able to manage trials of regular with timely and complete oral clearing. Education provied to family on allowing regular diet with thin liquids and pt able to independently state that he takes small sips to decreased any choking. Skilled ST doesn't appear indicated at this time but can be re-consulted if pt's condition changes. All voiced agreement and understanding.     Aspiration Risk  No limitations    Diet Recommendation Age appropriate regular with thin liquids  Medication Administration: Whole meds with liquid    Other  Recommendations Oral Care Recommendations: Oral care BID   Follow up Recommendations  None            Swallow Study   General Date of Onset: 11/11/15 HPI: Matthew [name] is a 82 y.o. male with a known history of hypertension, chronic kidney disease stage III, history of melanoma right eye comes to the emergency room after he had a mechanical fall while doing yard work this morning. Patient remembers laying down for about half an hour until he got attention brought to the emergency room was found to have left femoral fracture. Pt noted with coughing on thin liquids by nursing and order initiated for Bedside Swallow Evaluation. Current diet is full liquid d/t recent hip surgery.  Type of Study: Bedside Swallow Evaluation Previous Swallow Assessment: N/A Diet Prior to this Study: Thin liquids (Full liquid  d/t hip repair) Temperature Spikes Noted: No Respiratory Status: Nasal cannula (2 liters) History of Recent Intubation: No Behavior/Cognition: Alert;Cooperative;Pleasant mood Oral Cavity Assessment: Within Functional Limits Oral Care Completed by SLP: No Oral Cavity - Dentition: Adequate natural dentition Vision: Functional for self-feeding Self-Feeding Abilities: Able to feed self Patient Positioning: Upright in bed Baseline Vocal Quality: Normal Volitional Cough: Strong Volitional Swallow: Able to  elicit    Oral/Motor/Sensory Function Overall Oral Motor/Sensory Function: Within functional limits   Ice Chips Ice chips: Within functional limits Presentation: Self Fed;Cup   Thin Liquid Thin Liquid: Within functional limits Presentation: Cup;Self Fed;Straw    Nectar Thick Nectar Thick Liquid: Not tested   Honey Thick Honey Thick Liquid: Not tested   Puree Puree: Within functional limits Presentation: Self Fed;Spoon   Solid   GO   Solid: Within functional limits Presentation: [place] 11/12/2015,12:02 PM

## 2015-11-12 NOTE — Discharge Summary (Addendum)
[facility] [facility] [place] NAME: Matthew [name]    MR#:  [id number]  DATE OF BIRTH:  05/19/1924  DATE OF ADMISSION:  11/11/2015 ADMITTING PHYSICIAN: Fritzi Mandes, MD  DATE OF DISCHARGE: 11/14/2015  PRIMARY CARE PHYSICIAN: Loura Pardon, MD    ADMISSION DIAGNOSIS:  Fall, initial encounter [id number]] Femoral neck fracture, left, closed, initial encounter [S72.002A]  DISCHARGE DIAGNOSIS:  Active Problems:   Hip fracture ([place])  s/p surgery 11/11/15. SECONDARY DIAGNOSIS:   Past Medical History  Diagnosis Date  . Allergic rhinitis   . History of transfusion of whole blood   . Melanoma ([place])     history of, by right eye  . Hypertension   . Renal insufficiency   . Chronic kidney disease     stage 3, [place] benign appearing cysts 05/2006  . Pulmonary nodules     being obs with serial CT, 6 month f/u stable 10/05  . Chronic constipation   . History of CT scan of chest 6/09    calcification consistent with asbestos exp, also pleural nodule, incidental gallstones    HOSPITAL COURSE:  82 y.o. male with a known history of hypertension, chronic kidney disease stage III, history of melanoma right eye admitted after he had a mechanical fall while doing yard work and was found to have  1. Left hip fracture status post mechanical fall. - s/p surgery 11/11/15 - manage per ortho. Follow up in office in 2 weeks. - pain and DVT management Per orthopedics - He is being discharged to [facility].  2. Hypertension -Stable. Continue amlodipine  DISCHARGE CONDITIONS:   Stable.  CONSULTS OBTAINED:  Treatment Team:  Corky Mull, MD  DRUG ALLERGIES:   Allergies  Allergen Reactions  . Chlorthalidone     REACTION: frequent urination  . Codeine     REACTION: nausea  . Iodine     REACTION: caused bubble to come up in eye  . Penicillins     REACTION: GI  . Ramipril     REACTION: cough  . Ropinirole Hydrochloride     REACTION:  insomnia    DISCHARGE MEDICATIONS:   Current Discharge Medication List    START taking these medications   Details  docusate sodium (COLACE) 100 MG capsule Take 1 capsule (100 mg total) by mouth 2 (two) times daily. Qty: 10 capsule, Refills: 0    enoxaparin (LOVENOX) 40 MG/0.4ML injection Inject 0.4 mLs (40 mg total) into the skin daily. Qty: 14 Syringe, Refills: 0    oxyCODONE (OXY IR/ROXICODONE) 5 MG immediate release tablet Take 1-2 tablets (5-10 mg total) by mouth every 3 (three) hours as needed for breakthrough pain. Qty: 30 tablet, Refills: 0    pantoprazole (PROTONIX) 40 MG tablet Take 1 tablet (40 mg total) by mouth daily. Qty: 30 tablet, Refills: 0      CONTINUE these medications which have CHANGED   Details  amLODipine (NORVASC) 5 MG tablet Take 1 tablet (5 mg total) by mouth daily. Qty: 30 tablet, Refills: 0      CONTINUE these medications which have NOT CHANGED   Details  aspirin 81 MG tablet Take 81 mg by mouth daily.      atorvastatin (LIPITOR) 40 MG tablet Take 20 mg by mouth daily.          DISCHARGE INSTRUCTIONS:  Follow with ortho in office in 2 weeks.  If you experience worsening of your admission symptoms, develop shortness of breath, life threatening  emergency, suicidal or homicidal thoughts you must seek medical attention immediately by calling 911 or calling your MD immediately  if symptoms less severe.  You Must read complete instructions/literature along with all the possible adverse reactions/side effects for all the Medicines you take and that have been prescribed to you. Take any new Medicines after you have completely understood and accept all the possible adverse reactions/side effects.   Please note  You were cared for by a hospitalist during your hospital stay. If you have any questions about your discharge medications or the care you received while you were in the hospital after you are discharged, you can call the unit and asked to  speak with the hospitalist on call if the hospitalist that took care of you is not available. Once you are discharged, your primary care physician will handle any further medical issues. Please note that NO REFILLS for any discharge medications will be authorized once you are discharged, as it is imperative that you return to your primary care physician (or establish a relationship with a primary care physician if you do not have one) for your aftercare needs so that they can reassess your need for medications and monitor your lab values.    Today   CHIEF COMPLAINT:   Chief Complaint  Patient presents with  . Fall    HISTORY OF PRESENT ILLNESS:  Matthew [name]  is a 82 y.o. male with a known history of hypertension, chronic kidney disease stage III, history of melanoma right eye comes to the emergency room after he had a mechanical fall while doing yard work this morning. Patient remembers laying down for about half an hour until he got attention brought to the emergency room was found to have left femoral fracture. Patient denies any chest pain shortness of breath or any dizziness or any weakness. He does have left hip pain. He is being admitted for further evaluation of management of his left hip fracture. Patient does not have any cardiac history or any history of MI unknown to him.  VITAL SIGNS:  Blood pressure 134/66, pulse 92, temperature 98.5 F (36.9 C), temperature source Oral, resp. rate 16, height 5\' 8"  (1.727 m), weight 65.363 kg (144 lb 1.6 oz), SpO2 95 %. PHYSICAL EXAMINATION:  GENERAL: 82 y.o.-year-old patient lying in the bed with no acute distress.  EYES: Pupils equal, round, reactive to light and accommodation. No scleral icterus. Extraocular muscles intact.  HEENT: Head atraumatic, normocephalic. Oropharynx and nasopharynx clear.  NECK: Supple, no jugular venous distention. No thyroid enlargement, no tenderness.  LUNGS: Normal breath sounds bilaterally, no wheezing,  rales,rhonchi or crepitation. No use of accessory muscles of respiration.  CARDIOVASCULAR: S1, S2 normal. No murmurs, rubs, or gallops.  ABDOMEN: Soft, nontender, nondistended. Bowel sounds present. No organomegaly or mass.  EXTREMITIES: No pedal edema, cyanosis, or clubbing.  NEUROLOGIC: Cranial nerves II through XII are intact. Muscle strength 5/5 in all extremities except left lower due to pain. Sensation intact. Gait not checked.  PSYCHIATRIC: The patient is alert and oriented x 3.  SKIN: No obvious rash, lesion, or ulcer.  DATA REVIEW:   CBC  Recent Labs Lab 11/12/15 0531  WBC 13.5*  HGB 10.4*  HCT 30.4*  PLT 158    Chemistries   Recent Labs Lab 11/11/15 1152  11/14/15 0421  NA 139  < > 140  K 4.2  < > 3.9  CL 107  < > 105  CO2 25  < > 29  GLUCOSE 103*  < >  101*  BUN 27*  < > 26*  CREATININE 1.19  < > 1.01  CALCIUM 9.1  < > 8.6*  AST 25  --   --   ALT 25  --   --   ALKPHOS 87  --   --   BILITOT 0.7  --   --   < > = values in this interval not displayed.   Management plans discussed with the patient, family and they are in agreement.  CODE STATUS: FULL CODE  TOTAL TIME TAKING CARE OF THIS PATIENT: 35 minutes.    [facility], Kameron Blethen M.D on 11/14/2015 at 9:12 AM  Between 7am to 6pm - Pager - [phone]  After 6pm go to [email or web address] - password [email or web address] [facility]  Office  [phone]  CC: Primary care physician; Loura Pardon, MD   Note: This dictation was prepared with Dragon dictation along with smaller phrase technology. Any transcriptional errors that result from this process are unintentional.

## 2015-11-12 NOTE — Evaluation (Signed)
Physical Therapy Evaluation Patient Details Name: Matthew [name] MRN: [id number] DOB: 1924/06/30 Today's Date: 11/12/2015   History of Present Illness  82 y/o male here after fall during yard work who suffering L hip fracture and needing hemiarthroplasty.  Known history of hypertension, chronic kidney disease stage III, history of melanoma right eye.  Clinical Impression  Pt did well with PT exam though he did have issues with nausea, drop in O2 on room air (as low as 89% in ~2 minutes of room air, 2.5 liters reapplied), and general expected post op weakness/hesitancy with mobility involving the L LE.  Pt showed great effort and relatively good strength with ~10 minutes of exercises apart from exam.  Pt did not have excessive pain, but was clearly uncomfortable with L LE movement.  Educated on posterior hip precautions, pt did need reminders to recall, will continue education.  Pt will benefit from continued PT services.    Follow Up Recommendations SNF    Equipment Recommendations  Rolling walker with 5" wheels    Recommendations for Other Services       Precautions / Restrictions Precautions Precautions: Posterior Hip Precaution Booklet Issued: Yes (comment) Restrictions Weight Bearing Restrictions: Yes LLE Weight Bearing: Weight bearing as tolerated      Mobility  Bed Mobility Overal bed mobility: Needs Assistance Bed Mobility: Supine to Sit     Supine to sit: Mod assist;Max assist     General bed mobility comments: Pt struggled getting to EOB and then to get trunk up to sitting.  He was heavily reliant on the rails and needs direct assist to steady himself at EOB and maintain balance initially  Transfers Overall transfer level: Needs assistance Equipment used: Rolling walker (2 wheeled) Transfers: Sit to/from Stand Sit to Stand: Max assist         General transfer comment: Pt was able to initiate liftin hips off bed but then required excessive assist to maintain  upward momentum and ultimately needed max assist to get to upright using the walker  Ambulation/Gait Ambulation/Gait assistance: Mod assist;Max assist Ambulation Distance (Feet): 3 Feet Assistive device: Rolling walker (2 wheeled)       General Gait Details: Pt struggled with ability to purposfully move feet to get to recliner.  He was hesitant to take full weight through the L and was heavily reliant on the walker to maintain upright.  Pt showed very good effort but also became nauseated with the effort and was unable to really take walking steps with simple turning transfer to recliner.   Stairs            Wheelchair Mobility    Modified Rankin (Stroke Patients Only)       Balance                                             Pertinent Vitals/Pain Pain Assessment: 0-10 Pain Score: 3  (increases with L LE movement, never a major limiter) Pain Location: L hip    Home Living Family/patient expects to be discharged to:: Skilled nursing facility Living Arrangements: Spouse/significant other               Additional Comments: When pt returns home he has 7 steps to enter with L rail.      Prior Function Level of Independence: Independent         Comments: Pt  is able to drive, do yard work, light activities in the home     Hand Dominance        Extremity/Trunk Assessment   Upper Extremity Assessment: Overall WFL for tasks assessed           Lower Extremity Assessment: LLE deficits/detail   LLE Deficits / Details: Pt displays typical hesitancy with post op movement, but showes great effort with testing and hip is grossly 3/5, unable to do SLRs.     Communication   Communication: No difficulties  Cognition Arousal/Alertness: Awake/alert Behavior During Therapy: WFL for tasks assessed/performed Overall Cognitive Status: Within Functional Limits for tasks assessed                      General Comments      Exercises  Total Joint Exercises Ankle Circles/Pumps: 5 reps;AROM Quad Sets: Strengthening;10 reps;Left Short Arc Quad: Strengthening;Left;10 reps Heel Slides: 5 reps;AROM;AAROM;Left Hip ABduction/ADduction: AROM;10 reps;Left Straight Leg Raises: PROM;AAROM;5 reps      Assessment/Plan    PT Assessment Patient needs continued PT services  PT Diagnosis Difficulty walking;Generalized weakness;Acute pain   PT Problem List Decreased strength;Decreased range of motion;Decreased activity tolerance;Decreased balance;Decreased mobility;Decreased knowledge of use of DME;Decreased safety awareness;Pain;Decreased knowledge of precautions  PT Treatment Interventions Gait training;DME instruction;Stair training;Functional mobility training;Therapeutic activities;Therapeutic exercise;Balance training;Patient/family education;Neuromuscular re-education   PT Goals (Current goals can be found in the Care Plan section) Acute Rehab PT Goals Patient Stated Goal: go home PT Goal Formulation: With patient Time For Goal Achievement: 11/26/15 Potential to Achieve Goals: Fair    Frequency BID   Barriers to discharge        Co-evaluation               End of Session Equipment Utilized During Treatment: Gait belt;Oxygen (2.5 liters) Activity Tolerance: Patient limited by fatigue Patient left: with call bell/phone within reach;with chair alarm set           Time: 0922-0954 PT Time Calculation (min) (ACUTE ONLY): 32 min   Charges:   PT Evaluation $PT Eval Low Complexity: 1 Procedure PT Treatments $Therapeutic Exercise: 8-22 mins   PT G Codes:        Kreg Shropshire, DPT 11/12/2015, 10:56 AM

## 2015-11-12 NOTE — Progress Notes (Signed)
[facility] [facility] [place] NAME: Matthew [name]    MR#:  [id number]  DATE OF BIRTH:  1925/04/07  SUBJECTIVE:  CHIEF COMPLAINT:   Chief Complaint  Patient presents with  . Fall   Hip fracture- s/p surgery.  REVIEW OF SYSTEMS:  CONSTITUTIONAL: No fever, fatigue or weakness.  EYES: No blurred or double vision.  EARS, NOSE, AND THROAT: No tinnitus or ear pain.  RESPIRATORY: No cough, shortness of breath, wheezing or hemoptysis.  CARDIOVASCULAR: No chest pain, orthopnea, edema.  GASTROINTESTINAL: No nausea, vomiting, diarrhea or abdominal pain.  GENITOURINARY: No dysuria, hematuria.  ENDOCRINE: No polyuria, nocturia,  HEMATOLOGY: No anemia, easy bruising or bleeding SKIN: No rash or lesion. MUSCULOSKELETAL: No joint pain or arthritis.   NEUROLOGIC: No tingling, numbness, weakness.  PSYCHIATRY: No anxiety or depression.   ROS  DRUG ALLERGIES:   Allergies  Allergen Reactions  . Chlorthalidone     REACTION: frequent urination  . Codeine     REACTION: nausea  . Iodine     REACTION: caused bubble to come up in eye  . Penicillins     REACTION: GI  . Ramipril     REACTION: cough  . Ropinirole Hydrochloride     REACTION: insomnia    VITALS:  Blood pressure 108/62, pulse 78, temperature 97.6 F (36.4 C), temperature source Oral, resp. rate 18, height 5\' 8"  (1.727 m), weight 65.363 kg (144 lb 1.6 oz), SpO2 94 %.  PHYSICAL EXAMINATION:  GENERAL:  82 y.o.-year-old patient lying in the bed with no acute distress.  EYES: Pupils equal, round, reactive to light and accommodation. No scleral icterus. Extraocular muscles intact.  HEENT: Head atraumatic, normocephalic. Oropharynx and nasopharynx clear.  NECK:  Supple, no jugular venous distention. No thyroid enlargement, no tenderness.  LUNGS: Normal breath sounds bilaterally, no wheezing, rales,rhonchi or crepitation. No use of accessory muscles of respiration.  CARDIOVASCULAR: S1, S2 normal. No  murmurs, rubs, or gallops.  ABDOMEN: Soft, nontender, nondistended. Bowel sounds present. No organomegaly or mass.  EXTREMITIES: No pedal edema, cyanosis, or clubbing.  NEUROLOGIC: Cranial nerves II through XII are intact. Muscle strength 5/5 in all extremities except left lower due to pain. Sensation intact. Gait not checked.  PSYCHIATRIC: The patient is alert and oriented x 3.  SKIN: No obvious rash, lesion, or ulcer.   Physical Exam LABORATORY PANEL:   CBC  Recent Labs Lab 11/12/15 0531  WBC 13.5*  HGB 10.4*  HCT 30.4*  PLT 158   ------------------------------------------------------------------------------------------------------------------  Chemistries   Recent Labs Lab 11/11/15 1152 11/12/15 0531  NA 139 134*  K 4.2 4.6  CL 107 106  CO2 25 23  GLUCOSE 103* 118*  BUN 27* 28*  CREATININE 1.19 1.22  CALCIUM 9.1 8.0*  AST 25  --   ALT 25  --   ALKPHOS 87  --   BILITOT 0.7  --    ------------------------------------------------------------------------------------------------------------------  Cardiac Enzymes  Recent Labs Lab 11/11/15 1152  TROPONINI <0.03   ------------------------------------------------------------------------------------------------------------------  RADIOLOGY:  Dg Hip Port Unilat With Pelvis 1v Left  11/11/2015  CLINICAL DATA:  Postop day 0 left hip hemiarthroplasty performed for a left femoral neck fracture. EXAM: DG HIP (WITH OR WITHOUT PELVIS) 1V PORT LEFT 8:43 p.m.: COMPARISON:  Preoperative left hip x-rays earlier today 11:38 a.m. FINDINGS: Anatomic alignment of the left femoral prosthesis. No acute complicating features. IMPRESSION: Anatomic alignment post left hip hemiarthroplasty without acute complicating features. Electronically Signed   By: Evangeline Dakin  M.D.   On: 11/11/2015 21:04   Dg Hip Unilat W Or W/o Pelvis 2-3 Views Left  11/11/2015  CLINICAL DATA:  Acute left hip pain after fall today. EXAM: DG HIP (WITH OR  WITHOUT PELVIS) 2-3V LEFT COMPARISON:  None. FINDINGS: Moderately displaced fracture involving the proximal left femoral neck is noted. Femoral head is situated within the acetabulum. No significant joint space narrowing is noted. Right hip appears normal. IMPRESSION: Moderately displaced proximal left femoral neck fracture. Electronically Signed   By: Marijo Conception, M.D.   On: 11/11/2015 12:00    ASSESSMENT AND PLAN:   Active Problems:   Hip fracture ([place])  1. Left hip fracture status post mechanical fall. - s/p surgery 11/11/15 - manage per ortho. - pain and DVT management. - PT eval.  2. Hypertension -Stable. Continue amlodipine -Resume aspirin after surgery  3. Hyperlipidemia -Continue statins  4. DVT prophylaxis -on lovenox.  5. Dysphagia   Get SLP eval.    All the records are reviewed and case discussed with Care Management/Social Workerr. Management plans discussed with the patient, family and they are in agreement.  CODE STATUS: Full.  TOTAL TIME TAKING CARE OF THIS PATIENT: 35 minutes.     POSSIBLE D/C IN 2-3 DAYS, DEPENDING ON CLINICAL CONDITION.   Vaughan Basta M.D on 11/12/2015   Between 7am to 6pm - Pager - [phone]  After 6pm go to [email or web address] - password [email or web address] [facility]  Office  [phone]  CC: Primary care physician; Loura Pardon, MD  Note: This dictation was prepared with Dragon dictation along with smaller phrase technology. Any transcriptional errors that result from this process are unintentional.

## 2015-11-12 NOTE — NC FL2 (Signed)
[place] LEVEL OF CARE SCREENING TOOL     IDENTIFICATION  Patient Name: Matthew [name] Birthdate: [birth year] Sex: male Admission Date (Current Location): 11/11/2015  [place] and [place] Number:  Engineering geologist and Address:  [facility], [address], [place], [place] [address]      Provider Number: [id number]  Attending Physician Name and Address:  Vaughan Basta, MD  Relative Name and Phone Number:       Current Level of Care: Hospital Recommended Level of Care: [place] Prior Approval Number:    Date Approved/Denied:   PASRR Number:  ([id number])  Discharge Plan: SNF    Current Diagnoses: Patient Active Problem List   Diagnosis Date Noted  . Hip fracture ([place]) 11/11/2015  . Encounter for Medicare annual wellness exam 07/09/2013  . Fatigue 07/09/2013  . Hx laparoscopic cholecystectomy 05/16/2013  . Prostate cancer screening 06/04/2011  . CONSTIPATION, CHRONIC 01/12/2010  . Nonspecific (abnormal) findings on radiological and other examination of body structure 10/07/2007  . ALLERGIC CONJUNCTIVITIS 10/04/2007  . EDEMA 10/04/2007  . HYPERCHOLESTEROLEMIA 08/10/2006  . RESTLESS LEG SYNDROME 08/10/2006  . Essential hypertension 08/10/2006  . ALLERGIC RHINITIS 08/10/2006  . Renal insufficiency 08/10/2006  . SKIN CANCER, HX OF 08/10/2006    Orientation RESPIRATION BLADDER Height & Weight     Self, Time, Situation, Place  O2 (2 Liters Oxygen ) Continent Weight: 144 lb 1.6 oz (65.363 kg) Height:  5\' 8"  (172.7 cm)  BEHAVIORAL SYMPTOMS/MOOD NEUROLOGICAL BOWEL NUTRITION STATUS   (none )  (none ) Continent Diet (Diet: Clear Liquid )  AMBULATORY STATUS COMMUNICATION OF NEEDS Skin   Extensive Assist Verbally Surgical wounds (Incision: Left Hip. )                       Personal Care Assistance Level of Assistance  Bathing, Feeding, Dressing Bathing Assistance: Limited assistance Feeding  assistance: Independent Dressing Assistance: Limited assistance     Functional Limitations Info  Sight, Hearing, Speech Sight Info: Adequate Hearing Info: Impaired Speech Info: Adequate    SPECIAL CARE FACTORS FREQUENCY  PT (By licensed PT), OT (By licensed OT)     PT Frequency:  (5) OT Frequency:  (5)            Contractures      Additional Factors Info  Code Status, Allergies Code Status Info:  (Full Code. ) Allergies Info:  (Chlorthalidone, Codeine, Iodine, Penicillins, Ramipril, Ropinirole Hydrochloride)           Current Medications (11/12/2015):  This is the current hospital active medication list Current Facility-Administered Medications  Medication Dose Route Frequency Provider Last Rate Last Dose  . acetaminophen (TYLENOL) tablet 650 mg  650 mg Oral Q6H PRN Fritzi Mandes, MD       Or  . acetaminophen (TYLENOL) suppository 650 mg  650 mg Rectal Q6H PRN Fritzi Mandes, MD      . acetaminophen (TYLENOL) tablet 1,000 mg  1,000 mg Oral Q6H Corky Mull, MD   1,000 mg at 11/12/15 0512  . amLODipine (NORVASC) tablet 10 mg  10 mg Oral Daily Fritzi Mandes, MD   10 mg at 11/12/15 0929  . atorvastatin (LIPITOR) tablet 40 mg  40 mg Oral Daily Fritzi Mandes, MD   40 mg at 11/12/15 0929  . bisacodyl (DULCOLAX) suppository 10 mg  10 mg Rectal Daily PRN Fritzi Mandes, MD      . calcium citrate-vitamin D 500-400 MG-UNIT per chewable  tablet 1 tablet  1 tablet Oral BID Vaughan Basta, MD   1 tablet at 11/12/15 0928  . clindamycin (CLEOCIN) IVPB 600 mg  600 mg Intravenous Q6H Corky Mull, MD   600 mg at 11/12/15 0512  . dextrose 5 % and 0.9 % NaCl with KCl 20 mEq/L infusion   Intravenous Continuous Corky Mull, MD 100 mL/hr at 11/12/15 0929    . diphenhydrAMINE (BENADRYL) 12.5 MG/5ML elixir 12.5-25 mg  12.5-25 mg Oral Q4H PRN Corky Mull, MD      . docusate sodium (COLACE) capsule 100 mg  100 mg Oral BID Fritzi Mandes, MD   100 mg at 11/12/15 0929  . enoxaparin (LOVENOX) injection 40 mg  40  mg Subcutaneous Q24H Corky Mull, MD   40 mg at 11/12/15 0928  . HYDROmorphone (DILAUDID) injection 0.5-1 mg  0.5-1 mg Intravenous Q2H PRN Corky Mull, MD      . magnesium hydroxide (MILK OF MAGNESIA) suspension 30 mL  30 mL Oral Daily PRN Corky Mull, MD      . metoCLOPramide (REGLAN) tablet 5-10 mg  5-10 mg Oral Q8H PRN Corky Mull, MD       Or  . metoCLOPramide (REGLAN) injection 5-10 mg  5-10 mg Intravenous Q8H PRN Corky Mull, MD      . ondansetron [employer]) tablet 4 mg  4 mg Oral Q6H PRN Fritzi Mandes, MD       Or  . ondansetron (ZOFRAN) injection 4 mg  4 mg Intravenous Q6H PRN Fritzi Mandes, MD      . oxyCODONE (Oxy IR/ROXICODONE) immediate release tablet 5-10 mg  5-10 mg Oral Q3H PRN Corky Mull, MD   5 mg at 11/12/15 0933  . pantoprazole (PROTONIX) EC tablet 40 mg  40 mg Oral Daily Corky Mull, MD   40 mg at 11/12/15 0929  . senna-docusate (Senokot-S) tablet 1 tablet  1 tablet Oral QHS PRN Fritzi Mandes, MD      . sodium phosphate (FLEET) 7-19 GM/118ML enema 1 enema  1 enema Rectal Once PRN Corky Mull, MD         Discharge Medications: Please see discharge summary for a list of discharge medications.  Relevant Imaging Results:  Relevant Lab Results:   Additional Information  (SSN: [id number])  Sample, Bronwen Betters, LCSW

## 2015-11-12 NOTE — Progress Notes (Signed)
Physical Therapy Treatment Patient Details Name: Matthew [name] MRN: [id number] DOB: 05/09/1924 Today's Date: 11/12/2015    History of Present Illness 82 y/o male here after fall during yard work who suffering L hip fracture and needing hemiarthroplasty.  Known history of hypertension, chronic kidney disease stage III, history of melanoma right eye.    PT Comments    Pt did well with bed exercises and after just a few reps to warm up he does not show any increased pain with them.  He was eager to try and do more standing/walking this afternoon, but remained quite limited and overall we had to shorten standing session secondary to lightheadedness.  He was unable to recall precautions but overall showed good attitude and motivation despite feeling poorly.  Follow Up Recommendations  SNF     Equipment Recommendations  Rolling walker with 5" wheels    Recommendations for Other Services       Precautions / Restrictions Precautions Precautions: Posterior Hip Restrictions LLE Weight Bearing: Weight bearing as tolerated    Mobility  Bed Mobility Overal bed mobility: Needs Assistance Bed Mobility: Sit to Supine;Supine to Sit     Supine to sit: Mod assist Sit to supine: Mod assist;Max assist   General bed mobility comments: Pt again needing assist to get LEs to EOB but actaully able to show some AROM in L.  Pt heavily reliant on the handrails and though he is weak he shows good effort.  Transfers Overall transfer level: Needs assistance Equipment used: Rolling walker (2 wheeled) Transfers: Sit to/from Stand Sit to Stand: Mod assist;Max assist         General transfer comment: Pt shows good effort but again struggles to clear his hips off the bed surface and is reliant on leaning back of legs onto bed to maintain upright.  Pt does not shift weight forward to walker very well and needs constant assist to keep from losing balance backwards.  Ambulation/Gait Ambulation/Gait  assistance: Mod assist Ambulation Distance (Feet): 5 Feet Assistive device: Rolling walker (2 wheeled)       General Gait Details: Pt able to do some minimal side stepping along EOB but struggles to really use walker well and shift weight to either side.  Pt becomes dizzy and needed to sit down.    Stairs            Wheelchair Mobility    Modified Rankin (Stroke Patients Only)       Balance                                    Cognition Arousal/Alertness: Awake/alert Behavior During Therapy: WFL for tasks assessed/performed Overall Cognitive Status: Within Functional Limits for tasks assessed                      Exercises Total Joint Exercises Ankle Circles/Pumps: Strengthening;10 reps Quad Sets: 15 reps;Strengthening Gluteal Sets: 15 reps;Strengthening Short Arc Quad: 15 reps;Strengthening Heel Slides: 10 reps;Strengthening Hip ABduction/ADduction: AROM;10 reps    General Comments        Pertinent Vitals/Pain Pain Assessment: 0-10 Pain Score: 4  Pain Location: L hip    Home Living                      Prior Function            PT Goals (current goals can now be  found in the care plan section) Progress towards PT goals: Progressing toward goals    Frequency  BID    PT Plan Current plan remains appropriate    Co-evaluation             End of Session Equipment Utilized During Treatment: Gait belt;Oxygen Activity Tolerance: Patient limited by fatigue Patient left: with bed alarm set;with call bell/phone within reach;with family/visitor present     Time: [id number] PT Time Calculation (min) (ACUTE ONLY): 29 min  Charges:  $Gait Training: 8-22 mins $Therapeutic Exercise: 8-22 mins                    G Codes:      Kreg Shropshire, DPT 11/12/2015, 4:23 PM

## 2015-11-12 NOTE — Clinical Social Work Note (Signed)
Clinical Social Work Assessment  Patient Details  Name: Matthew [name] MRN: [id number] Date of Birth: May 26, 1924  Date of referral:  11/12/15               Reason for consult:  Discharge Planning                Permission sought to share information with:  Family Supports Permission granted to share information::  Yes, Verbal Permission Granted  Name::        Agency::     Relationship::   Matthew [name])  Contact Information:     Housing/Transportation Living arrangements for the past 2 months:  Single Family Home Source of Information:  Patient Patient Interpreter Needed:  None Criminal Activity/Legal Involvement Pertinent to Current Situation/Hospitalization:  No - Comment as needed Significant Relationships:  Other Family Members, Spouse Lives with:  Spouse Do you feel safe going back to the place where you live?  Yes Need for family participation in patient care:  Yes (Comment) Matthew [name])  Care giving concerns:  Patient would benefit from SNF placement.    Social Worker assessment / plan:  CSW met with patient at bedside. Introduced herself and her role. CSW discussed SNF placement and [name] Medicare. Patient reports that he'd like to go to a SNF that is covered by his insurance. CSW informed patient that [facility] in [place] is not covered by his insurance. Patient reported that he'd like for the facility to be closed so his [name] does not have to travel far. Granted CSW verbal permission to send SNF referral. FL2/ PASRR completed, placed in MD's basket and faxed to SNFs in [facility]. Awaiting bed offers.   CSW presented bed offers to patient Oceanographer, [facility] and [facility]). Patient reports that he'd like to discuss the bed offers with his [name]. Granted CSW verbal permission to call his [name]. CSW called patient's [name] at the number listed on the facesheet. Left voicemail. Awaiting phone call back. CSW called [facility] to follow up with the referral sent since [facility] is  close to [place]. Awaiting phone call back. CSW will continue to follow and assist.   Employment status:  Retired Nurse, adult PT Recommendations:  [place] / Referral to community resources:  [place]  Patient/Family's Response to care:  Patient is in agreement with SNF placement at discharge. Requested placement close to [place] so his [name] can visit but also wants a facility that is in network with his insurance.   Patient/Family's Understanding of and Emotional Response to Diagnosis, Current Treatment, and Prognosis:  Patient reports that he understands his diagnosis, current treatment and prognosis. In agreement to go to SNF at discharge.   Emotional Assessment Appearance:  Appears stated age Attitude/Demeanor/Rapport:   (None) Affect (typically observed):  Calm, Pleasant, Accepting Orientation:  Oriented to Self, Oriented to Place, Oriented to  Time, Oriented to Situation Alcohol / Substance use:  Not Applicable Psych involvement (Current and /or in the community):  No (Comment)  Discharge Needs  Concerns to be addressed:  Discharge Planning Concerns Readmission within the last 30 days:  No Current discharge risk:  Chronically ill Barriers to Discharge:  Continued Medical Work up   [employer], LCSW 11/12/2015, 12:31 PM

## 2015-11-12 NOTE — Anesthesia Postprocedure Evaluation (Signed)
Anesthesia Post Note  Patient: Matthew [name]  Procedure(s) Performed: Procedure(s) (LRB): ARTHROPLASTY BIPOLAR HIP (HEMIARTHROPLASTY) (Left)  Patient location during evaluation: Nursing Unit Anesthesia Type: Spinal Level of consciousness: awake and alert and oriented Pain management: pain level controlled Vital Signs Assessment: post-procedure vital signs reviewed and stable Respiratory status: spontaneous breathing Cardiovascular status: stable Anesthetic complications: no    Last Vitals:  Filed Vitals:   11/12/15 0130 11/12/15 0520  BP: 104/59 139/70  Pulse: 79 84  Temp: 36.6 C 36.6 C  Resp: 19 18    Last Pain:  Filed Vitals:   11/12/15 0617  PainSc: Asleep                 Estill Batten

## 2015-11-12 NOTE — Progress Notes (Signed)
Clinical Education officer, museum (CSW) presented bed offers to patient. He chose [name]. Patient's wife [name] is in agreement with [name]. Per [facility] admissions coordinator at [name] he started [employer] authorization and can accept a 5 day LOG over the weekend. Per Doug patient will go to room 506. Zack Engineer, agricultural approved 5 day LOG. Plan is for patient to D/C to [place] Sunday 11/14/15 pending medical clearance. CSW will continue to follow and assist as needed.   [employer], LCSW [phone]

## 2015-11-12 NOTE — Clinical Social Work Placement (Signed)
   CLINICAL SOCIAL WORK PLACEMENT  NOTE  Date:  11/12/2015  Patient Details  Name: Matthew [name] MRN: [id number] Date of Birth: 03-15-1925  Clinical Social Work is seeking post-discharge placement for this patient at the [place] level of care (*CSW will initial, date and re-position this form in  chart as items are completed):  Yes   Patient/family provided with [place] Work Department's list of facilities offering this level of care within the geographic area requested by the patient (or if unable, by the patient's family).  Yes   Patient/family informed of their freedom to choose among providers that offer the needed level of care, that participate in Medicare, Medicaid or managed care program needed by the patient, have an available bed and are willing to accept the patient.  Yes   Patient/family informed of [facility]'s ownership interest in [facility] and [facility], as well as of the fact that they are under no obligation to receive care at these facilities.  PASRR submitted to EDS on 11/12/15     PASRR number received on 11/12/15     Existing PASRR number confirmed on       FL2 transmitted to all facilities in geographic area requested by pt/family on 11/12/15     FL2 transmitted to all facilities within larger geographic area on       Patient informed that his/her managed care company has contracts with or will negotiate with certain facilities, including the following:        Yes   Patient/family informed of bed offers received.  Patient chooses bed at  [facility] )     Physician recommends and patient chooses bed at      Patient to be transferred to   on  .  Patient to be transferred to facility by       Patient family notified on   of transfer.  Name of family member notified:        PHYSICIAN       Additional Comment:    _______________________________________________ Jerell Demery, Bronwen Betters,  LCSW 11/12/2015, 3:31 PM

## 2015-11-12 NOTE — Anesthesia Procedure Notes (Signed)
Spinal Patient location during procedure: OR Start time: 11/11/2015 6:00 PM End time: 11/11/2015 6:05 PM Staffing Anesthesiologist: Alvin Critchley Performed by: anesthesiologist  Preanesthetic Checklist Completed: patient identified, site marked, surgical consent, pre-op evaluation, timeout performed, IV checked, risks and benefits discussed and monitors and equipment checked Spinal Block Patient position: sitting Prep: Betadine and site prepped and draped Patient monitoring: heart rate, cardiac monitor, continuous pulse ox and blood pressure Approach: midline Location: L4-5 Injection technique: single-shot Needle Needle type: Quincke  Needle gauge: 25 G Needle length: 9 cm Assessment Sensory level: T8 Additional Notes Time out called.  Patient carefully placed in sitting position with some sedation.  The back was prepped and draped in sterile fashion.  A skin wheal was made in the L3-L4 interspace with 1% Lidocaine plain.  A 25G Quincke needle was used to perform the spinal with the return of clear, colorless CSF in all 4 Quads.  13 mg of spinal marcaine with 1: 200K epi wash.  The resultant level was T8T8.  The prep was completely washed away at the end of the procedure and the patient tolerated the procedure well.

## 2015-11-12 NOTE — Care Management Note (Signed)
Case Management Note  Patient Details  Name: Matthew [name] MRN: [id number] Date of Birth: 08/09/1924  Subjective/Objective:                    Action/Plan: Anticipated discharge plan is SNF.  Will follow for changing needs.   Expected Discharge Date:                  Expected Discharge Plan:  Skilled Nursing Facility  In-House Referral:  Clinical Social Work  Discharge planning Services  CM Consult  Post Acute Care Choice:    Choice offered to:     DME Arranged:    DME Agency:     HH Arranged:    [place] Agency:     Status of Service:  Completed, signed off  If discussed at [employer], dates discussed:    Additional Comments:  Alvie Heidelberg, RN 11/12/2015, 11:21 AM

## 2015-11-12 NOTE — Progress Notes (Signed)
  Subjective: 1 Day Post-Op Procedure(s) (LRB): ARTHROPLASTY BIPOLAR HIP (HEMIARTHROPLASTY) (Left) Patient reports pain as mild.   Patient is well, and has had no acute complaints or problems Plan is to go Skilled nursing facility after hospital stay. Negative for chest pain and shortness of breath Fever: no Gastrointestinal:Negative for nausea and vomiting  Objective: Vital signs in last 24 hours: Temp:  [97.7 F (36.5 C)-98.4 F (36.9 C)] 97.8 F (36.6 C) (07/14 0520) Pulse Rate:  [77-103] 84 (07/14 0520) Resp:  [14-32] 18 (07/14 0520) BP: (88-141)/(54-74) 139/70 mmHg (07/14 0520) SpO2:  [88 %-97 %] 94 % (07/14 0520) Weight:  [65.363 kg (144 lb 1.6 oz)-66.679 kg (147 lb)] 65.363 kg (144 lb 1.6 oz) (07/13 1354)  Intake/Output from previous day:  Intake/Output Summary (Last 24 hours) at 11/12/15 0751 Last data filed at 11/12/15 0619  Gross per 24 hour  Intake   5390 ml  Output    980 ml  Net   4410 ml    Intake/Output this shift:    Labs:  Recent Labs  11/11/15 1152 11/11/15 2042 11/12/15 0531  HGB 13.3 11.8* 10.4*    Recent Labs  11/11/15 1152 11/11/15 2042 11/12/15 0531  WBC 12.6*  --  13.5*  RBC 4.31*  --  3.35*  HCT 39.1* 34.3* 30.4*  PLT 181  --  158    Recent Labs  11/11/15 1152 11/12/15 0531  NA 139 134*  K 4.2 4.6  CL 107 106  CO2 25 23  BUN 27* 28*  CREATININE 1.19 1.22  GLUCOSE 103* 118*  CALCIUM 9.1 8.0*   No results for input(s): LABPT, INR in the last 72 hours.   EXAM General - Patient is Alert, Appropriate and Oriented Extremity - ABD soft Sensation intact distally Intact pulses distally Dorsiflexion/Plantar flexion intact Incision: dressing C/D/I No cellulitis present Dressing/Incision - clean, dry, no drainage, mild swelling to the left hip. Motor Function - intact, moving foot and toes well on exam.   Negative Homan's Abdomen soft with normal BS  Past Medical History  Diagnosis Date  . Allergic rhinitis   .  History of transfusion of whole blood   . Melanoma ([place])     history of, by right eye  . Hypertension   . Renal insufficiency   . Chronic kidney disease     stage 3, [place] benign appearing cysts 05/2006  . Pulmonary nodules     being obs with serial CT, 6 month f/u stable 10/05  . Chronic constipation   . History of CT scan of chest 6/09    calcification consistent with asbestos exp, also pleural nodule, incidental gallstones    Assessment/Plan: 1 Day Post-Op Procedure(s) (LRB): ARTHROPLASTY BIPOLAR HIP (HEMIARTHROPLASTY) (Left) Active Problems:   Hip fracture ([facility])  Estimated body mass index is 21.92 kg/(m^2) as calculated from the following:   Height as of this encounter: 5\' 8"  (1.727 m).   Weight as of this encounter: 65.363 kg (144 lb 1.6 oz). Advance diet Up with therapy   Hg 10.4, HCT 30.4, continue to monitor for potential transfusion, patient had moderate blood loss during surgery. Abdomen soft with normal BS. Plan will be for discharge to SNF when medically appropriate. Repeat CBC and BMP tomorrow morning.  DVT Prophylaxis - Lovenox, Foot Pumps and TED hose Weight-Bearing as tolerated to left leg  J. Cameron Proud, PA-C [facility] 11/12/2015, 7:51 AM

## 2015-11-13 LAB — BASIC METABOLIC PANEL
Anion gap: 5 (ref 5–15)
BUN: 27 mg/dL — AB (ref 6–20)
CALCIUM: 8.5 mg/dL — AB (ref 8.9–10.3)
CHLORIDE: 110 mmol/L (ref 101–111)
CO2: 25 mmol/L (ref 22–32)
CREATININE: 1.13 mg/dL (ref 0.61–1.24)
GFR calc non Af Amer: 56 mL/min — ABNORMAL LOW (ref >60)
Glucose, Bld: 100 mg/dL — ABNORMAL HIGH (ref 65–99)
Potassium: 4.3 mmol/L (ref 3.5–5.1)
SODIUM: 140 mmol/L (ref 135–145)

## 2015-11-13 MED ORDER — ENSURE ENLIVE PO LIQD: 237.0000 mL | Freq: Two times a day (BID) | ORAL | Status: DC

## 2015-11-13 MED ORDER — AMLODIPINE BESYLATE 5 MG PO TABS
5.0000 mg | ORAL_TABLET | Freq: Every day | ORAL | Status: DC
  Administered 2015-11-13 – 2015-11-14 (×2): 5 mg via ORAL
  Filled 2015-11-13 (×2): qty 1

## 2015-11-13 MED ORDER — ENOXAPARIN SODIUM 40 MG/0.4ML ~~LOC~~ SOLN: 40.0000 mg | SUBCUTANEOUS | Status: DC

## 2015-11-13 MED ORDER — AMLODIPINE BESYLATE 5 MG PO TABS: 5.0000 mg | ORAL_TABLET | Freq: Every day | ORAL | Status: DC

## 2015-11-13 NOTE — Progress Notes (Signed)
[place] [place] NAME: Madsen Frohn    MR#:  [id number]  DATE OF BIRTH:  02/22/1925  SUBJECTIVE:  CHIEF COMPLAINT:   Chief Complaint  Patient presents with  . Fall  doing well. No issues.  REVIEW OF SYSTEMS:  CONSTITUTIONAL: No fever, fatigue or weakness.  EYES: No blurred or double vision.  EARS, NOSE, AND THROAT: No tinnitus or ear pain.  RESPIRATORY: No cough, shortness of breath, wheezing or hemoptysis.  CARDIOVASCULAR: No chest pain, orthopnea, edema.  GASTROINTESTINAL: No nausea, vomiting, diarrhea or abdominal pain.  GENITOURINARY: No dysuria, hematuria.  ENDOCRINE: No polyuria, nocturia,  HEMATOLOGY: No anemia, easy bruising or bleeding SKIN: No rash or lesion. MUSCULOSKELETAL: No joint pain or arthritis.   NEUROLOGIC: No tingling, numbness, weakness.  PSYCHIATRY: No anxiety or depression.   ROS  DRUG ALLERGIES:   Allergies  Allergen Reactions  . Chlorthalidone     REACTION: frequent urination  . Codeine     REACTION: nausea  . Iodine     REACTION: caused bubble to come up in eye  . Penicillins     REACTION: GI  . Ramipril     REACTION: cough  . Ropinirole Hydrochloride     REACTION: insomnia    VITALS:  Blood pressure 131/64, pulse 93, temperature 98 F (36.7 C), temperature source Oral, resp. rate 16, height 5\' 8"  (1.727 m), weight 65.363 kg (144 lb 1.6 oz), SpO2 98 %. PHYSICAL EXAMINATION:  GENERAL:  82 y.o.-year-old patient lying in the bed with no acute distress.  EYES: Pupils equal, round, reactive to light and accommodation. No scleral icterus. Extraocular muscles intact.  HEENT: Head atraumatic, normocephalic. Oropharynx and nasopharynx clear.  NECK:  Supple, no jugular venous distention. No thyroid enlargement, no tenderness.  LUNGS: Normal breath sounds bilaterally, no wheezing, rales,rhonchi or crepitation. No use of accessory muscles of respiration.  CARDIOVASCULAR: S1, S2 normal. No murmurs,  rubs, or gallops.  ABDOMEN: Soft, nontender, nondistended. Bowel sounds present. No organomegaly or mass.  EXTREMITIES: No pedal edema, cyanosis, or clubbing.  NEUROLOGIC: Cranial nerves II through XII are intact. Muscle strength 5/5 in all extremities except left lower due to pain. Sensation intact. Gait not checked.  PSYCHIATRIC: The patient is alert and oriented x 3.  SKIN: No obvious rash, lesion, or ulcer.   Physical Exam LABORATORY PANEL:   CBC  Recent Labs Lab 11/12/15 0531  WBC 13.5*  HGB 10.4*  HCT 30.4*  PLT 158   ------------------------------------------------------------------------------------------------------------------  Chemistries   Recent Labs Lab 11/11/15 1152  11/13/15 0530  NA 139  < > 140  K 4.2  < > 4.3  CL 107  < > 110  CO2 25  < > 25  GLUCOSE 103*  < > 100*  BUN 27*  < > 27*  CREATININE 1.19  < > 1.13  CALCIUM 9.1  < > 8.5*  AST 25  --   --   ALT 25  --   --   ALKPHOS 87  --   --   BILITOT 0.7  --   --   < > = values in this interval not displayed. ------------------------------------------------------------------------------------------------------------------  Cardiac Enzymes  Recent Labs Lab 11/11/15 1152  TROPONINI <0.03   ------------------------------------------------------------------------------------------------------------------  RADIOLOGY:  Dg Hip Port Unilat With Pelvis 1v Left  11/11/2015  CLINICAL DATA:  Postop day 0 left hip hemiarthroplasty performed for a left femoral neck fracture. EXAM: DG HIP (WITH OR WITHOUT PELVIS) 1V PORT LEFT  8:43 p.m.: COMPARISON:  Preoperative left hip x-rays earlier today 11:38 a.m. FINDINGS: Anatomic alignment of the left femoral prosthesis. No acute complicating features. IMPRESSION: Anatomic alignment post left hip hemiarthroplasty without acute complicating features. Electronically Signed   By: Evangeline Dakin M.D.   On: 11/11/2015 21:04   Dg Hip Unilat W Or W/o Pelvis 2-3 Views  Left  11/11/2015  CLINICAL DATA:  Acute left hip pain after fall today. EXAM: DG HIP (WITH OR WITHOUT PELVIS) 2-3V LEFT COMPARISON:  None. FINDINGS: Moderately displaced fracture involving the proximal left femoral neck is noted. Femoral head is situated within the acetabulum. No significant joint space narrowing is noted. Right hip appears normal. IMPRESSION: Moderately displaced proximal left femoral neck fracture. Electronically Signed   By: Marijo Conception, M.D.   On: 11/11/2015 12:00   ASSESSMENT AND PLAN:   Active Problems:   Hip fracture ([place])  1. Left hip fracture status post mechanical fall. - s/p surgery 11/11/15 - manage per ortho. - pain and DVT management.  2. Hypertension -Stable. Cut back on amlodipine dose as BP low normal -Resume aspirin   3. Hyperlipidemia -Continue statins  4. DVT prophylaxis -on lovenox.  5. Dysphagia  SLP eval.  6. Hypoxia: on 2 liters o2 - will wean as tolerated   All the records are reviewed and case discussed with Care Management/Social Worker. Management plans discussed with the patient and Nursing. They are in agreement.  CODE STATUS: Full.  TOTAL TIME TAKING CARE OF THIS PATIENT: 35 minutes.     POSSIBLE D/C IN AM, DEPENDING ON CLINICAL CONDITION. And bed availability at [facility]   [facility], Sherol Sabas M.D on 11/13/2015   Between 7am to 6pm - Pager - [phone]  After 6pm go to [email or web address] - password [email or web address] [facility]  Office  [phone]  CC: Primary care physician; Loura Pardon, MD  Note: This dictation was prepared with Dragon dictation along with smaller phrase technology. Any transcriptional errors that result from this process are unintentional.

## 2015-11-13 NOTE — Discharge Instructions (Signed)
HIP HEMIARTHROPLASTY POSTOPERATIVE DIRECTIONS  Hip Rehabilitation, Guidelines Following Surgery  The results of a hip operation are greatly improved after range of motion and muscle strengthening exercises. Follow all safety measures which are given to protect your hip. If any of these exercises cause increased pain or swelling in your joint, decrease the amount until you are comfortable again. Then slowly increase the exercises. Call your caregiver if you have problems or questions.   HOME CARE INSTRUCTIONS  Remove items at home which could result in a fall. This includes throw rugs or furniture in walking pathways.   ICE to the affected hip every three hours for 30 minutes at a time and then as needed for pain and swelling.  Continue to use ice on the hip for pain and swelling from surgery. You may notice swelling that will progress down to the foot and ankle.  This is normal after surgery.  Elevate the leg when you are not up walking on it.    Continue to use the breathing machine which will help keep your temperature down.  It is common for your temperature to cycle up and down following surgery, especially at night when you are not up moving around and exerting yourself.  The breathing machine keeps your lungs expanded and your temperature down.  DIET You may resume your previous home diet once your are discharged from the hospital.  DRESSING / WOUND CARE / SHOWERING You may start showering once staples have been removed. Change dressing as needed. Remove staples and apply steri strips on 11/25/15    ACTIVITY Walk with your walker as instructed. Use walker as long as suggested by your caregivers. Avoid periods of inactivity such as sitting longer than an hour when not asleep. This helps prevent blood clots.  You may resume a sexual relationship in one month or when given the OK by your doctor.  You may return to work once you are cleared by your doctor.  Do not drive a car for 6  weeks or until released by you surgeon.  Do not drive while taking narcotics.  WEIGHT BEARING Weight bearing as tolerated  POSTOPERATIVE CONSTIPATION PROTOCOL Constipation - defined medically as fewer than three stools per week and severe constipation as less than one stool per week.  One of the most common issues patients have following surgery is constipation.  Even if you have a regular bowel pattern at home, your normal regimen is likely to be disrupted due to multiple reasons following surgery.  Combination of anesthesia, postoperative narcotics, change in appetite and fluid intake all can affect your bowels.  In order to avoid complications following surgery, here are some recommendations in order to help you during your recovery period.  Colace (docusate) - Pick up an over-the-counter form of Colace or another stool softener and take twice a day as long as you are requiring postoperative pain medications.  Take with a full glass of water daily.  If you experience loose stools or diarrhea, hold the colace until you stool forms back up.  If your symptoms do not get better within 1 week or if they get worse, check with your doctor.  Dulcolax (bisacodyl) - Pick up over-the-counter and take as directed by the product packaging as needed to assist with the movement of your bowels.  Take with a full glass of water.  Use this product as needed if not relieved by Colace only.   MiraLax (polyethylene glycol) - Pick up over-the-counter to have on  hand.  MiraLax is a solution that will increase the amount of water in your bowels to assist with bowel movements.  Take as directed and can mix with a glass of water, juice, soda, coffee, or tea.  Take if you go more than two days without a movement. Do not use MiraLax more than once per day. Call your doctor if you are still constipated or irregular after using this medication for 7 days in a row.  If you continue to have problems with postoperative  constipation, please contact the office for further assistance and recommendations.  If you experience "the worst abdominal pain ever" or develop nausea or vomiting, please contact the office immediatly for further recommendations for treatment.  ITCHING  If you experience itching with your medications, try taking only a single pain pill, or even half a pain pill at a time.  You can also use Benadryl over the counter for itching or also to help with sleep.   TED HOSE STOCKINGS Wear the elastic stockings on both legs for six weeks following surgery during the day but you may remove then at night for sleeping.  MEDICATIONS See your medication summary on the After Visit Summary that the nursing staff will review with you prior to discharge.  You may have some home medications which will be placed on hold until you complete the course of blood thinner medication.  It is important for you to complete the blood thinner medication as prescribed by your surgeon.  Continue your approved medications as instructed at time of discharge.  PRECAUTIONS If you experience chest pain or shortness of breath - call 911 immediately for transfer to the hospital emergency department.  If you develop a fever greater that 101 F, purulent drainage from wound, increased redness or drainage from wound, foul odor from the wound/dressing, or calf pain - CONTACT YOUR SURGEON.                                                   FOLLOW-UP APPOINTMENTS Make sure you keep all of your appointments after your operation with your surgeon and caregivers. You should call the office at the above phone number and make an appointment for approximately two weeks after the date of your surgery or on the date instructed by your surgeon outlined in the "After Visit Summary".  RANGE OF MOTION AND STRENGTHENING EXERCISES  These exercises are designed to help you keep full movement of your hip joint. Follow your caregiver's or physical  therapist's instructions. Perform all exercises about fifteen times, three times per day or as directed. Exercise both hips, even if you have had only one joint replacement. These exercises can be done on a training (exercise) mat, on the floor, on a table or on a bed. Use whatever works the best and is most comfortable for you. Use music or television while you are exercising so that the exercises are a pleasant break in your day. This will make your life better with the exercises acting as a break in routine you can look forward to.  Lying on your back, slowly slide your foot toward your buttocks, raising your knee up off the floor. Then slowly slide your foot back down until your leg is straight again.  Lying on your back spread your legs as far apart as you can without causing  discomfort.  Lying on your side, raise your upper leg and foot straight up from the floor as far as is comfortable. Slowly lower the leg and repeat.  Lying on your back, tighten up the muscle in the front of your thigh (quadriceps muscles). You can do this by keeping your leg straight and trying to raise your heel off the floor. This helps strengthen the largest muscle supporting your knee.  Lying on your back, tighten up the muscles of your buttocks both with the legs straight and with the knee bent at a comfortable angle while keeping your heel on the floor.      IF YOU ARE TRANSFERRED TO A SKILLED REHAB FACILITY If the patient is transferred to a skilled rehab facility following release from the hospital, a list of the current medications will be sent to the facility for the patient to continue.  When discharged from the skilled rehab facility, please have the facility set up the patient's [place] prior to being released. Also, the skilled facility will be responsible for providing the patient with their medications at time of release from the facility to include their pain medication, the muscle  relaxants, and their blood thinner medication. If the patient is still at the rehab facility at time of the two week follow up appointment, the skilled rehab facility will also need to assist the patient in arranging follow up appointment in our office and any transportation needs.  MAKE SURE YOU:  Understand these instructions.  Get help right away if you are not doing well or get worse.    Pick up stool softner and laxative for home use following surgery while on pain medications. Continue to use ice for pain and swelling after surgery. Do not use any lotions or creams on the incision until instructed by your surgeon.

## 2015-11-13 NOTE — Progress Notes (Addendum)
  Subjective: 2 Days Post-Op Procedure(s) (LRB): ARTHROPLASTY BIPOLAR HIP (HEMIARTHROPLASTY) (Left) Patient reports pain as mild.   Patient is well, and has had no acute complaints or problems Plan is to go Skilled nursing facility after hospital stay. No chest pain, shortness of breath, abdominal pain.  Objective: Vital signs in last 24 hours: Temp:  [97.8 F (36.6 C)-98.6 F (37 C)] 98 F (36.7 C) (07/15 0732) Pulse Rate:  [81-98] 93 (07/15 0732) Resp:  [16-18] 16 (07/15 0732) BP: (98-135)/(52-64) 131/64 mmHg (07/15 0732) SpO2:  [94 %-100 %] 98 % (07/15 0732)  Intake/Output from previous day:  Intake/Output Summary (Last 24 hours) at 11/13/15 0836 Last data filed at 11/13/15 0744  Gross per 24 hour  Intake   1895 ml  Output    370 ml  Net   1525 ml    Intake/Output this shift: Total I/O In: -  Out: 200 [Urine:200]  Labs:  Recent Labs  11/11/15 1152 11/11/15 2042 11/12/15 0531  HGB 13.3 11.8* 10.4*    Recent Labs  11/11/15 1152 11/11/15 2042 11/12/15 0531  WBC 12.6*  --  13.5*  RBC 4.31*  --  3.35*  HCT 39.1* 34.3* 30.4*  PLT 181  --  158    Recent Labs  11/12/15 0531 11/13/15 0530  NA 134* 140  K 4.6 4.3  CL 106 110  CO2 23 25  BUN 28* 27*  CREATININE 1.22 1.13  GLUCOSE 118* 100*  CALCIUM 8.0* 8.5*   No results for input(s): LABPT, INR in the last 72 hours.   EXAM General - Patient is Alert, Appropriate and Oriented Extremity - ABD soft Sensation intact distally Intact pulses distally Dorsiflexion/Plantar flexion intact Incision: dressing C/D/I No cellulitis present  - homans Dressing/Incision - mild drainage, thigh soft, mild ecchymosis. Dressing changed Motor Function - intact, moving foot and toes well on exam.     Past Medical History  Diagnosis Date  . Allergic rhinitis   . History of transfusion of whole blood   . Melanoma ([place])     history of, by right eye  . Hypertension   . Renal insufficiency   . Chronic kidney  disease     stage 3, [place] benign appearing cysts 05/2006  . Pulmonary nodules     being obs with serial CT, 6 month f/u stable 10/05  . Chronic constipation   . History of CT scan of chest 6/09    calcification consistent with asbestos exp, also pleural nodule, incidental gallstones    Assessment/Plan: 2 Days Post-Op Procedure(s) (LRB): ARTHROPLASTY BIPOLAR HIP (HEMIARTHROPLASTY) (Left) Active Problems:   Hip fracture ([facility])  Estimated body mass index is 21.92 kg/(m^2) as calculated from the following:   Height as of this encounter: 5\' 8"  (1.727 m).   Weight as of this encounter: 65.363 kg (144 lb 1.6 oz). Advance diet Up with therapy , WBAT LLE Patient is doing well. VSS. CBC pending this am  Plan on discharge to SNF Sunday Remove staples and apply steri strips on 11/25/15 Follow up with [facility] in 2 weeks  DVT Prophylaxis - Lovenox, Foot Pumps and TED hose Weight-Bearing as tolerated to left leg  Duanne Guess, PA-C [facility] 11/13/2015, 8:36 AM

## 2015-11-13 NOTE — Progress Notes (Signed)
Physical Therapy Treatment Patient Details Name: Matthew [name] MRN: [id number] DOB: 04-01-1925 Today's Date: 11/13/2015    History of Present Illness 82 y/o male here after fall during yard work who suffering L hip fracture and needing hemiarthroplasty.  Known history of hypertension, chronic kidney disease stage III, history of melanoma right eye.    PT Comments    Pt continues to show good effort with PT but ultimately is very functionally limited.  He seems to have strained his L shoulder while trying to move in bed and is guarded and limited with using L UE.  He shows ability to do some bed exercises and does not have excessive pain with L hip activity, but clearly has some discomfort.  Pt struggles to remain upright and effectively take steps with both LEs and in going from bed to recliner is fatigued and dizzy with the effort.    Follow Up Recommendations  SNF     Equipment Recommendations  Rolling walker with 5" wheels    Recommendations for Other Services       Precautions / Restrictions Precautions Precautions: Posterior Hip Restrictions LLE Weight Bearing: Weight bearing as tolerated    Mobility  Bed Mobility Overal bed mobility: Needs Assistance Bed Mobility: Supine to Sit     Supine to sit: Mod assist;Max assist     General bed mobility comments: Pt with L shoulder pain (reports this from trying to scoot up in bed) making his attempt to use bed rail difficult.  Pt needing considerable assist  Transfers Overall transfer level: Needs assistance Equipment used: Rolling walker (2 wheeled) Transfers: Sit to/from Stand Sit to Stand: Max assist         General transfer comment: Pt again struggles with getting to standing needing heavy assist to shift weight forward onto the walker and generally to simply elevate his body weight  Ambulation/Gait Ambulation/Gait assistance: Mod assist Ambulation Distance (Feet): 3 Feet Assistive device: Rolling walker (2  wheeled)       General Gait Details: Pt only able to take a few small, very unsteady turning steps to go bed to recliner, he shows good effort and motivation but ultimately is very limited with ability to stand/balance/weight shift and effectively take steps.   Stairs            Wheelchair Mobility    Modified Rankin (Stroke Patients Only)       Balance Overall balance assessment: Needs assistance   Sitting balance-Leahy Scale: Fair       Standing balance-Leahy Scale: Poor                      Cognition Arousal/Alertness: Awake/alert Behavior During Therapy: WFL for tasks assessed/performed Overall Cognitive Status: Within Functional Limits for tasks assessed                      Exercises Total Joint Exercises Ankle Circles/Pumps: Strengthening;10 reps Quad Sets: 15 reps;Strengthening Gluteal Sets: 15 reps;Strengthening Short Arc Quad: 15 reps;Strengthening Heel Slides: 10 reps;Strengthening Hip ABduction/ADduction: AROM;10 reps    General Comments        Pertinent Vitals/Pain Pain Score: 5  Pain Location: Pt c/o more of new L shoulder pain than L hip    Home Living                      Prior Function            PT Goals (current goals  can now be found in the care plan section) Progress towards PT goals: Progressing toward goals    Frequency  BID    PT Plan Current plan remains appropriate    Co-evaluation             End of Session Equipment Utilized During Treatment: Gait belt;Oxygen Activity Tolerance: Patient limited by fatigue Patient left: with chair alarm set;with call bell/phone within reach     Time: 0918-0959 PT Time Calculation (min) (ACUTE ONLY): 41 min  Charges:  $Gait Training: 8-22 mins $Therapeutic Exercise: 8-22 mins $Therapeutic Activity: 8-22 mins                    G Codes:      Kreg Shropshire, DPT 11/13/2015, 12:01 PM

## 2015-11-13 NOTE — Progress Notes (Signed)
Physical Therapy Treatment Patient Details Name: Matthew [name] MRN: [id number] DOB: [birth year] Today's Date: 11/13/2015    History of Present Illness 82 y/o male here after fall during yard work who suffering L hip fracture and needing hemiarthroplasty.  Known history of hypertension, chronic kidney disease stage III, history of melanoma right eye.    PT Comments    Pt shows good effort but continues to struggle with standing/stepping and ultimately is unable to do any true ambulation.  He needs assist just to shift weight enough to slide each foot an inch or two and fatigues significantly with minimal "ambulation."  Some of this may be confounded secondary to new onset UE pain.  Pt needed cues for precautions and ultimately is not making a lot of improvement despite good effort.   Follow Up Recommendations  SNF     Equipment Recommendations  Rolling walker with 5" wheels    Recommendations for Other Services       Precautions / Restrictions Precautions Precautions: Posterior Hip Restrictions LLE Weight Bearing: Weight bearing as tolerated    Mobility  Bed Mobility Overal bed mobility: Needs Assistance Bed Mobility: Sit to Supine       Sit to supine: Mod assist   General bed mobility comments: Pt continues to be very limited with ability to get into bed, unable to lift LE effectively  Transfers Overall transfer level: Needs assistance Equipment used: Rolling walker (2 wheeled) Transfers: Sit to/from Stand Sit to Stand: Mod assist         General transfer comment: Pt shows good effort, but continues to need a lot of assist to initiate upward movement and to maintain forward weight shift to keep from falling back.    Ambulation/Gait Ambulation/Gait assistance: Mod assist;Max assist Ambulation Distance (Feet): 3 Feet Assistive device: Rolling walker (2 wheeled)       General Gait Details: Pt continues to be very limited with his ability to move/advance both  LEs in standing and he is unable to consistently keep weight on UEs/walker.  He has multiple LOBs with the minimal time shuffling to turn to EOB.    Stairs            Wheelchair Mobility    Modified Rankin (Stroke Patients Only)       Balance             Standing balance-Leahy Scale: Poor                      Cognition Arousal/Alertness: Awake/alert Behavior During Therapy: WFL for tasks assessed/performed Overall Cognitive Status: Within Functional Limits for tasks assessed                      Exercises Total Joint Exercises Ankle Circles/Pumps: 15 reps;Strengthening Quad Sets: 15 reps;Strengthening Gluteal Sets: 15 reps;Strengthening Short Arc Quad: 10 reps;Strengthening Heel Slides: 10 reps;Strengthening Hip ABduction/ADduction: 15 reps;AROM;AAROM Straight Leg Raises: PROM;AAROM;5 reps    General Comments        Pertinent Vitals/Pain Pain Score: 5  Pain Location: still reporting L shoulder more painful than hip    Home Living                      Prior Function            PT Goals (current goals can now be found in the care plan section) Progress towards PT goals: Progressing toward goals (very slow progression)    Frequency  BID    PT Plan Current plan remains appropriate    Co-evaluation             End of Session Equipment Utilized During Treatment: Gait belt;Oxygen Activity Tolerance: Patient limited by fatigue Patient left: with bed alarm set;with call bell/phone within reach     Time: 1310-1338 PT Time Calculation (min) (ACUTE ONLY): 28 min  Charges:  $Gait Training: 8-22 mins $Therapeutic Exercise: 8-22 mins                    G Codes:      Kreg Shropshire, DPT 11/13/2015, 3:37 PM

## 2015-11-14 DIAGNOSIS — N320 Bladder-neck obstruction: Secondary | ICD-10-CM | POA: Diagnosis present

## 2015-11-14 DIAGNOSIS — Z823 Family history of stroke: Secondary | ICD-10-CM | POA: Diagnosis not present

## 2015-11-14 DIAGNOSIS — Z85820 Personal history of malignant melanoma of skin: Secondary | ICD-10-CM | POA: Diagnosis not present

## 2015-11-14 DIAGNOSIS — Z7982 Long term (current) use of aspirin: Secondary | ICD-10-CM | POA: Diagnosis not present

## 2015-11-14 DIAGNOSIS — E860 Dehydration: Secondary | ICD-10-CM | POA: Diagnosis not present

## 2015-11-14 DIAGNOSIS — N390 Urinary tract infection, site not specified: Secondary | ICD-10-CM | POA: Diagnosis present

## 2015-11-14 DIAGNOSIS — K219 Gastro-esophageal reflux disease without esophagitis: Secondary | ICD-10-CM | POA: Diagnosis not present

## 2015-11-14 DIAGNOSIS — K5900 Constipation, unspecified: Secondary | ICD-10-CM | POA: Diagnosis not present

## 2015-11-14 DIAGNOSIS — Z96642 Presence of left artificial hip joint: Secondary | ICD-10-CM | POA: Diagnosis not present

## 2015-11-14 DIAGNOSIS — N400 Enlarged prostate without lower urinary tract symptoms: Secondary | ICD-10-CM | POA: Diagnosis not present

## 2015-11-14 DIAGNOSIS — K5641 Fecal impaction: Secondary | ICD-10-CM | POA: Diagnosis present

## 2015-11-14 DIAGNOSIS — R0902 Hypoxemia: Secondary | ICD-10-CM | POA: Diagnosis not present

## 2015-11-14 DIAGNOSIS — Z7401 Bed confinement status: Secondary | ICD-10-CM | POA: Diagnosis not present

## 2015-11-14 DIAGNOSIS — Z85840 Personal history of malignant neoplasm of eye: Secondary | ICD-10-CM | POA: Diagnosis not present

## 2015-11-14 DIAGNOSIS — Z800 Family history of malignant neoplasm of digestive organs: Secondary | ICD-10-CM | POA: Diagnosis not present

## 2015-11-14 DIAGNOSIS — N138 Other obstructive and reflux uropathy: Secondary | ICD-10-CM | POA: Diagnosis not present

## 2015-11-14 DIAGNOSIS — I10 Essential (primary) hypertension: Secondary | ICD-10-CM | POA: Diagnosis not present

## 2015-11-14 DIAGNOSIS — R627 Adult failure to thrive: Secondary | ICD-10-CM | POA: Diagnosis not present

## 2015-11-14 DIAGNOSIS — S72002A Fracture of unspecified part of neck of left femur, initial encounter for closed fracture: Secondary | ICD-10-CM | POA: Diagnosis not present

## 2015-11-14 DIAGNOSIS — R339 Retention of urine, unspecified: Secondary | ICD-10-CM | POA: Diagnosis not present

## 2015-11-14 DIAGNOSIS — W19XXXD Unspecified fall, subsequent encounter: Secondary | ICD-10-CM | POA: Diagnosis not present

## 2015-11-14 DIAGNOSIS — M810 Age-related osteoporosis without current pathological fracture: Secondary | ICD-10-CM | POA: Diagnosis not present

## 2015-11-14 DIAGNOSIS — T83511A Infection and inflammatory reaction due to indwelling urethral catheter, initial encounter: Secondary | ICD-10-CM | POA: Diagnosis not present

## 2015-11-14 DIAGNOSIS — N401 Enlarged prostate with lower urinary tract symptoms: Secondary | ICD-10-CM | POA: Diagnosis present

## 2015-11-14 DIAGNOSIS — I959 Hypotension, unspecified: Secondary | ICD-10-CM | POA: Diagnosis present

## 2015-11-14 DIAGNOSIS — Z6822 Body mass index (BMI) 22.0-22.9, adult: Secondary | ICD-10-CM | POA: Diagnosis not present

## 2015-11-14 DIAGNOSIS — I129 Hypertensive chronic kidney disease with stage 1 through stage 4 chronic kidney disease, or unspecified chronic kidney disease: Secondary | ICD-10-CM | POA: Diagnosis not present

## 2015-11-14 DIAGNOSIS — R6251 Failure to thrive (child): Secondary | ICD-10-CM | POA: Diagnosis not present

## 2015-11-14 DIAGNOSIS — W19XXXA Unspecified fall, initial encounter: Secondary | ICD-10-CM | POA: Diagnosis not present

## 2015-11-14 DIAGNOSIS — B952 Enterococcus as the cause of diseases classified elsewhere: Secondary | ICD-10-CM | POA: Diagnosis present

## 2015-11-14 DIAGNOSIS — E785 Hyperlipidemia, unspecified: Secondary | ICD-10-CM | POA: Diagnosis not present

## 2015-11-14 DIAGNOSIS — N183 Chronic kidney disease, stage 3 (moderate): Secondary | ICD-10-CM | POA: Diagnosis not present

## 2015-11-14 DIAGNOSIS — R630 Anorexia: Secondary | ICD-10-CM | POA: Diagnosis not present

## 2015-11-14 DIAGNOSIS — R4182 Altered mental status, unspecified: Secondary | ICD-10-CM | POA: Diagnosis not present

## 2015-11-14 DIAGNOSIS — Z9981 Dependence on supplemental oxygen: Secondary | ICD-10-CM | POA: Diagnosis not present

## 2015-11-14 DIAGNOSIS — N1330 Unspecified hydronephrosis: Secondary | ICD-10-CM | POA: Diagnosis present

## 2015-11-14 DIAGNOSIS — M84459A Pathological fracture, hip, unspecified, initial encounter for fracture: Secondary | ICD-10-CM | POA: Diagnosis not present

## 2015-11-14 DIAGNOSIS — S72002D Fracture of unspecified part of neck of left femur, subsequent encounter for closed fracture with routine healing: Secondary | ICD-10-CM | POA: Diagnosis not present

## 2015-11-14 DIAGNOSIS — N179 Acute kidney failure, unspecified: Secondary | ICD-10-CM | POA: Diagnosis not present

## 2015-11-14 DIAGNOSIS — R509 Fever, unspecified: Secondary | ICD-10-CM | POA: Diagnosis not present

## 2015-11-14 DIAGNOSIS — Z8249 Family history of ischemic heart disease and other diseases of the circulatory system: Secondary | ICD-10-CM | POA: Diagnosis not present

## 2015-11-14 LAB — BASIC METABOLIC PANEL
Anion gap: 6 (ref 5–15)
BUN: 26 mg/dL — AB (ref 6–20)
CHLORIDE: 105 mmol/L (ref 101–111)
CO2: 29 mmol/L (ref 22–32)
CREATININE: 1.01 mg/dL (ref 0.61–1.24)
Calcium: 8.6 mg/dL — ABNORMAL LOW (ref 8.9–10.3)
GFR calc Af Amer: >60 mL/min (ref >60)
GFR calc non Af Amer: >60 mL/min (ref >60)
Glucose, Bld: 101 mg/dL — ABNORMAL HIGH (ref 65–99)
Potassium: 3.9 mmol/L (ref 3.5–5.1)
Sodium: 140 mmol/L (ref 135–145)

## 2015-11-14 LAB — CBC
HEMATOCRIT: 27.1 % — AB (ref 40.0–52.0)
Hemoglobin: 9.5 g/dL — ABNORMAL LOW (ref 13.0–18.0)
MCH: 31.8 pg (ref 26.0–34.0)
MCHC: 35.2 g/dL (ref 32.0–36.0)
MCV: 90.2 fL (ref 80.0–100.0)
PLATELETS: 137 10*3/uL — AB (ref 150–440)
RBC: 3.01 MIL/uL — AB (ref 4.40–5.90)
RDW: 13.3 % (ref 11.5–14.5)
WBC: 10.7 10*3/uL — ABNORMAL HIGH (ref 3.8–10.6)

## 2015-11-14 NOTE — Progress Notes (Signed)
Clinical Social Worker was informed that patient will be medically ready to discharge to [facility]. Patient in a agreement with plan. CSW called Teressa- Admissions at [facility] to confirm that patient's bed is ready. Provided patient's room number [id number] and number to call for report [phone] . All discharge information faxed to [facility] via [employer]. Call to patient's wife, to inform her patient would discharge to [facility]. RN will call report and patient will discharge to [facility] via EMS.  Ernest Pine, MSW, [place], [place] Clinical Social Worker [phone]

## 2015-11-14 NOTE — Clinical Social Work Placement (Signed)
   CLINICAL SOCIAL WORK PLACEMENT  NOTE  Date:  11/14/2015  Patient Details  Name: Matthew [name] MRN: [id number] Date of Birth: 1924/07/13  Clinical Social Work is seeking post-discharge placement for this patient at the [place] level of care (*CSW will initial, date and re-position this form in  chart as items are completed):  Yes   Patient/family provided with [facility] Work Department's list of facilities offering this level of care within the geographic area requested by the patient (or if unable, by the patient's family).  Yes   Patient/family informed of their freedom to choose among providers that offer the needed level of care, that participate in Medicare, Medicaid or managed care program needed by the patient, have an available bed and are willing to accept the patient.  Yes   Patient/family informed of [facility]'s ownership interest in [facility] and [facility], as well as of the fact that they are under no obligation to receive care at these facilities.  PASRR submitted to EDS on 11/12/15     PASRR number received on 11/12/15     Existing PASRR number confirmed on       FL2 transmitted to all facilities in geographic area requested by pt/family on 11/12/15     FL2 transmitted to all facilities within larger geographic area on       Patient informed that his/her managed care company has contracts with or will negotiate with certain facilities, including the following:        Yes   Patient/family informed of bed offers received.  Patient chooses bed at  [facility] )     Physician recommends and patient chooses bed at      Patient to be transferred to  ([facility]) on 11/14/15.  Patient to be transferred to facility by  (EMS)     Patient family notified on 11/14/15 of transfer.  Name of family member notified:   (Wife- Faye)     PHYSICIAN       Additional Comment:     _______________________________________________ Baldemar Lenis, LCSW 11/14/2015, 10:00 AM

## 2015-11-14 NOTE — Progress Notes (Signed)
  Subjective: 3 Days Post-Op Procedure(s) (LRB): ARTHROPLASTY BIPOLAR HIP (HEMIARTHROPLASTY) (Left) Patient reports pain as mild.   Patient is well, and has had no acute complaints or problems Plan is to go Skilled nursing facility after hospital stay. No chest pain, shortness of breath, abdominal pain.  Objective: Vital signs in last 24 hours: Temp:  [97.6 F (36.4 C)-98.5 F (36.9 C)] 98.5 F (36.9 C) (07/16 0714) Pulse Rate:  [91-94] 92 (07/16 0714) Resp:  [16-18] 16 (07/16 0714) BP: (126-139)/(65-72) 134/66 mmHg (07/16 0714) SpO2:  [87 %-99 %] 95 % (07/16 0714)  Intake/Output from previous day:  Intake/Output Summary (Last 24 hours) at 11/14/15 0852 Last data filed at 11/14/15 0830  Gross per 24 hour  Intake    120 ml  Output   1050 ml  Net   -930 ml    Intake/Output this shift: Total I/O In: -  Out: 175 [Urine:175]  Labs:  Recent Labs  11/11/15 1152 11/11/15 2042 11/12/15 0531  HGB 13.3 11.8* 10.4*    Recent Labs  11/11/15 1152 11/11/15 2042 11/12/15 0531  WBC 12.6*  --  13.5*  RBC 4.31*  --  3.35*  HCT 39.1* 34.3* 30.4*  PLT 181  --  158    Recent Labs  11/13/15 0530 11/14/15 0421  NA 140 140  K 4.3 3.9  CL 110 105  CO2 25 29  BUN 27* 26*  CREATININE 1.13 1.01  GLUCOSE 100* 101*  CALCIUM 8.5* 8.6*   No results for input(s): LABPT, INR in the last 72 hours.   EXAM General - Patient is Alert, Appropriate and Oriented Extremity - ABD soft Sensation intact distally Intact pulses distally Dorsiflexion/Plantar flexion intact Incision: dressing C/D/I and scant drainage No cellulitis present  - homans Dressing/Incision - scant drainage, thigh soft, mild ecchymosis.  Motor Function - intact, moving foot and toes well on exam.     Past Medical History  Diagnosis Date  . Allergic rhinitis   . History of transfusion of whole blood   . Melanoma ([place])     history of, by right eye  . Hypertension   . Renal insufficiency   . Chronic  kidney disease     stage 3, [place] benign appearing cysts 05/2006  . Pulmonary nodules     being obs with serial CT, 6 month f/u stable 10/05  . Chronic constipation   . History of CT scan of chest 6/09    calcification consistent with asbestos exp, also pleural nodule, incidental gallstones    Assessment/Plan: 3 Days Post-Op Procedure(s) (LRB): ARTHROPLASTY BIPOLAR HIP (HEMIARTHROPLASTY) (Left) Active Problems:   Hip fracture (HCC)  Estimated body mass index is 21.92 kg/(m^2) as calculated from the following:   Height as of this encounter: 5\' 8"  (1.727 m).   Weight as of this encounter: 65.363 kg (144 lb 1.6 oz). Advance diet Up with therapy , WBAT LLE Patient is doing well. VSS. CBC pending this am  Plan on discharge to SNF today pending medicine clearance Follow up with [facility] in 2 weeks for staple removal and steri strip application. Continue with TED hose for 6 weeks, remove at night time. Lovenox 40 mg SQ x 14 days  DVT Prophylaxis - Lovenox, Foot Pumps and TED hose Weight-Bearing as tolerated to left leg  Duanne Guess, PA-C [facility] Orthopaedic Surgery 11/14/2015, 8:52 AM

## 2015-11-14 NOTE — Progress Notes (Signed)
Pt discharged to [facility] via EMS. REPORT CALLED TO [facility] AT FACILITY. PT TO LEAVE WITH O2 1L/MIN. ABDUCTOR PILLOW IN PLACE

## 2015-11-15 ENCOUNTER — Encounter: Payer: Self-pay | Admitting: Surgery

## 2015-11-15 LAB — SURGICAL PATHOLOGY

## 2015-11-16 DIAGNOSIS — M810 Age-related osteoporosis without current pathological fracture: Secondary | ICD-10-CM | POA: Diagnosis not present

## 2015-11-16 DIAGNOSIS — R0902 Hypoxemia: Secondary | ICD-10-CM | POA: Diagnosis not present

## 2015-11-16 DIAGNOSIS — E785 Hyperlipidemia, unspecified: Secondary | ICD-10-CM | POA: Diagnosis not present

## 2015-11-16 DIAGNOSIS — I10 Essential (primary) hypertension: Secondary | ICD-10-CM | POA: Diagnosis not present

## 2015-11-22 DIAGNOSIS — R627 Adult failure to thrive: Secondary | ICD-10-CM | POA: Diagnosis not present

## 2015-11-22 DIAGNOSIS — N400 Enlarged prostate without lower urinary tract symptoms: Secondary | ICD-10-CM | POA: Diagnosis not present

## 2015-11-22 DIAGNOSIS — K219 Gastro-esophageal reflux disease without esophagitis: Secondary | ICD-10-CM | POA: Diagnosis not present

## 2015-12-07 ENCOUNTER — Encounter: Payer: Self-pay | Admitting: Emergency Medicine

## 2015-12-07 ENCOUNTER — Inpatient Hospital Stay
Admission: EM | Admit: 2015-12-07 | Discharge: 2015-12-10 | DRG: 684 | Disposition: A | Discharge status: Skilled Nursing Facility | Payer: Medicare HMO | Attending: Internal Medicine | Admitting: Internal Medicine

## 2015-12-07 ENCOUNTER — Emergency Department: Payer: Medicare HMO

## 2015-12-07 DIAGNOSIS — R627 Adult failure to thrive: Secondary | ICD-10-CM | POA: Diagnosis not present

## 2015-12-07 DIAGNOSIS — N401 Enlarged prostate with lower urinary tract symptoms: Secondary | ICD-10-CM | POA: Diagnosis present

## 2015-12-07 DIAGNOSIS — Z823 Family history of stroke: Secondary | ICD-10-CM

## 2015-12-07 DIAGNOSIS — Z85820 Personal history of malignant melanoma of skin: Secondary | ICD-10-CM

## 2015-12-07 DIAGNOSIS — E860 Dehydration: Secondary | ICD-10-CM | POA: Diagnosis present

## 2015-12-07 DIAGNOSIS — N179 Acute kidney failure, unspecified: Principal | ICD-10-CM | POA: Diagnosis present

## 2015-12-07 DIAGNOSIS — I129 Hypertensive chronic kidney disease with stage 1 through stage 4 chronic kidney disease, or unspecified chronic kidney disease: Secondary | ICD-10-CM | POA: Diagnosis present

## 2015-12-07 DIAGNOSIS — I959 Hypotension, unspecified: Secondary | ICD-10-CM | POA: Diagnosis present

## 2015-12-07 DIAGNOSIS — N1330 Unspecified hydronephrosis: Secondary | ICD-10-CM | POA: Diagnosis present

## 2015-12-07 DIAGNOSIS — Z6822 Body mass index (BMI) 22.0-22.9, adult: Secondary | ICD-10-CM

## 2015-12-07 DIAGNOSIS — N183 Chronic kidney disease, stage 3 (moderate): Secondary | ICD-10-CM | POA: Diagnosis present

## 2015-12-07 DIAGNOSIS — Z96642 Presence of left artificial hip joint: Secondary | ICD-10-CM | POA: Diagnosis present

## 2015-12-07 DIAGNOSIS — N320 Bladder-neck obstruction: Secondary | ICD-10-CM | POA: Diagnosis present

## 2015-12-07 DIAGNOSIS — R509 Fever, unspecified: Secondary | ICD-10-CM | POA: Diagnosis not present

## 2015-12-07 DIAGNOSIS — Z8249 Family history of ischemic heart disease and other diseases of the circulatory system: Secondary | ICD-10-CM

## 2015-12-07 DIAGNOSIS — K5641 Fecal impaction: Secondary | ICD-10-CM | POA: Diagnosis present

## 2015-12-07 DIAGNOSIS — R630 Anorexia: Secondary | ICD-10-CM | POA: Diagnosis not present

## 2015-12-07 DIAGNOSIS — E785 Hyperlipidemia, unspecified: Secondary | ICD-10-CM | POA: Diagnosis present

## 2015-12-07 DIAGNOSIS — Z800 Family history of malignant neoplasm of digestive organs: Secondary | ICD-10-CM

## 2015-12-07 DIAGNOSIS — K219 Gastro-esophageal reflux disease without esophagitis: Secondary | ICD-10-CM | POA: Diagnosis present

## 2015-12-07 DIAGNOSIS — K5900 Constipation, unspecified: Secondary | ICD-10-CM | POA: Diagnosis not present

## 2015-12-07 LAB — CBC
HCT: 33.3 % — ABNORMAL LOW (ref 40.0–52.0)
Hemoglobin: 11 g/dL — ABNORMAL LOW (ref 13.0–18.0)
MCH: 29.4 pg (ref 26.0–34.0)
MCHC: 33.1 g/dL (ref 32.0–36.0)
MCV: 88.7 fL (ref 80.0–100.0)
Platelets: 366 10*3/uL (ref 150–440)
RBC: 3.75 MIL/uL — ABNORMAL LOW (ref 4.40–5.90)
RDW: 14.2 % (ref 11.5–14.5)
WBC: 20.9 10*3/uL — ABNORMAL HIGH (ref 3.8–10.6)

## 2015-12-07 LAB — COMPREHENSIVE METABOLIC PANEL
ALK PHOS: 91 U/L (ref 38–126)
ALT: 17 U/L (ref 17–63)
ANION GAP: 14 (ref 5–15)
AST: 19 U/L (ref 15–41)
Albumin: 3.2 g/dL — ABNORMAL LOW (ref 3.5–5.0)
BUN: 72 mg/dL — ABNORMAL HIGH (ref 6–20)
CALCIUM: 9 mg/dL (ref 8.9–10.3)
CO2: 28 mmol/L (ref 22–32)
CREATININE: 3.76 mg/dL — AB (ref 0.61–1.24)
Chloride: 97 mmol/L — ABNORMAL LOW (ref 101–111)
GFR, EST AFRICAN AMERICAN: 15 mL/min — AB (ref >60)
GFR, EST NON AFRICAN AMERICAN: 13 mL/min — AB (ref >60)
Glucose, Bld: 103 mg/dL — ABNORMAL HIGH (ref 65–99)
Potassium: 3.8 mmol/L (ref 3.5–5.1)
SODIUM: 139 mmol/L (ref 135–145)
TOTAL PROTEIN: 6.7 g/dL (ref 6.5–8.1)
Total Bilirubin: 1.2 mg/dL (ref 0.3–1.2)

## 2015-12-07 LAB — URINALYSIS COMPLETE WITH MICROSCOPIC (ARMC ONLY)
BILIRUBIN URINE: NEGATIVE
Bacteria, UA: NONE SEEN
GLUCOSE, UA: NEGATIVE mg/dL
Hgb urine dipstick: NEGATIVE
KETONES UR: NEGATIVE mg/dL
Leukocytes, UA: NEGATIVE
Nitrite: NEGATIVE
Protein, ur: NEGATIVE mg/dL
RBC / HPF: NONE SEEN RBC/hpf (ref 0–5)
SPECIFIC GRAVITY, URINE: 1.011 (ref 1.005–1.030)
SQUAMOUS EPITHELIAL / LPF: NONE SEEN
pH: 5 (ref 5.0–8.0)

## 2015-12-07 LAB — TROPONIN I: Troponin I: 0.05 ng/mL (ref <0.03)

## 2015-12-07 LAB — LIPASE, BLOOD: LIPASE: 26 U/L (ref 11–51)

## 2015-12-07 LAB — CK: CK TOTAL: 20 U/L — AB (ref 49–397)

## 2015-12-07 MED ORDER — SODIUM CHLORIDE 0.9 % IV SOLN
Freq: Once | INTRAVENOUS | Status: AC
  Administered 2015-12-07: 19:00:00 via INTRAVENOUS

## 2015-12-07 MED ORDER — BARIUM SULFATE 2.1 % PO SUSP
900.0000 mL | ORAL | Status: AC
  Administered 2015-12-07 (×2): 900 mL via ORAL

## 2015-12-07 MED ORDER — SODIUM CHLORIDE 0.9 % IV BOLUS (SEPSIS)
1000.0000 mL | Freq: Once | INTRAVENOUS | Status: AC
Start: 2015-12-07 — End: 2015-12-07
  Administered 2015-12-07: 1000 mL via INTRAVENOUS

## 2015-12-07 NOTE — ED Notes (Signed)
Fecal disimpaction completed by Dr. Kerman Passey. Assisted by this RN. Patient tolerated well. Large amount of stool removed.

## 2015-12-07 NOTE — ED Notes (Signed)
In and out cath completed by this RN assisted by Annie Main, RN. Patient tolerated well. Clear, amber colored urine returned.

## 2015-12-07 NOTE — ED Provider Notes (Signed)
[facility] Emergency Department Provider Note  Time seen: 3:57 PM  I have reviewed the triage vital signs and the nursing notes.   HISTORY  Chief Complaint Anorexia    HPI Matthew [name] is a 82 y.o. male with a past medical history of CK D, hypertension, who presents the emergency department from his nursing facility for decreased oral intake. According to EMS report the patient lives at [facility] facility. They have noted that the patient is not eating or drinking. Patient states his food does not taste good which is why he is not eating. And states that it feels like his mouth is always to dry. Patient denies any pain. Patient has no complaints at this time.  Past Medical History:  Diagnosis Date  . Allergic rhinitis   . Chronic constipation   . Chronic kidney disease    stage 3, [place] benign appearing cysts 05/2006  . History of CT scan of chest 6/09   calcification consistent with asbestos exp, also pleural nodule, incidental gallstones  . History of transfusion of whole blood   . Hypertension   . Melanoma ([place])    history of, by right eye  . Pulmonary nodules    being obs with serial CT, 6 month f/u stable 10/05  . Renal insufficiency     Patient Active Problem List   Diagnosis Date Noted  . Hip fracture ([place]) 11/11/2015  . Encounter for Medicare annual wellness exam 07/09/2013  . Fatigue 07/09/2013  . Hx laparoscopic cholecystectomy 05/16/2013  . Prostate cancer screening 06/04/2011  . CONSTIPATION, CHRONIC 01/12/2010  . Nonspecific (abnormal) findings on radiological and other examination of body structure 10/07/2007  . ALLERGIC CONJUNCTIVITIS 10/04/2007  . EDEMA 10/04/2007  . HYPERCHOLESTEROLEMIA 08/10/2006  . RESTLESS LEG SYNDROME 08/10/2006  . Essential hypertension 08/10/2006  . ALLERGIC RHINITIS 08/10/2006  . Renal insufficiency 08/10/2006  . SKIN CANCER, HX OF 08/10/2006    Past Surgical History:  Procedure  Laterality Date  . 2D echo  6/09   slight thickening of aortic valve with EF of 60%  . GANGLION CYST EXCISION     hands  . HIP ARTHROPLASTY Left 11/11/2015   Procedure: ARTHROPLASTY BIPOLAR HIP (HEMIARTHROPLASTY);  Surgeon: Corky Mull, MD;  Location: [facility] ORS;  Service: Orthopedics;  Laterality: Left;    Prior to Admission medications   Medication Sig Start Date End Date Taking? Authorizing Provider  amLODipine (NORVASC) 5 MG tablet Take 1 tablet (5 mg total) by mouth daily. 11/13/15   Max Sane, MD  aspirin 81 MG tablet Take 81 mg by mouth daily.      Historical Provider, MD  atorvastatin (LIPITOR) 40 MG tablet Take 20 mg by mouth daily.     Historical Provider, MD  docusate sodium (COLACE) 100 MG capsule Take 1 capsule (100 mg total) by mouth 2 (two) times daily. 11/12/15   Vaughan Basta, MD  enoxaparin (LOVENOX) 40 MG/0.4ML injection Inject 0.4 mLs (40 mg total) into the skin daily. 11/13/15 11/28/15  Duanne Guess, PA-C  oxyCODONE (OXY IR/ROXICODONE) 5 MG immediate release tablet Take 1-2 tablets (5-10 mg total) by mouth every 3 (three) hours as needed for breakthrough pain. 11/12/15   Vaughan Basta, MD  pantoprazole (PROTONIX) 40 MG tablet Take 1 tablet (40 mg total) by mouth daily. 11/12/15   Vaughan Basta, MD    Allergies  Allergen Reactions  . Chlorthalidone     REACTION: frequent urination  . Codeine     REACTION:  nausea  . Iodine     REACTION: caused bubble to come up in eye  . Penicillins     REACTION: GI  . Ramipril     REACTION: cough  . Ropinirole Hydrochloride     REACTION: insomnia    Family History  Problem Relation Age of Onset  . Hypertension Mother   . Hypertension Father   . Stroke Father   . Cancer Brother     pancreatic    Social History Social History  Substance Use Topics  . Smoking status: Former Research scientist (life sciences)  . Smokeless tobacco: Not on file     Comment: Quit smoking in 1957.  Marland Kitchen Alcohol use No    Review of  Systems Constitutional: Negative for fever. Cardiovascular: Negative for chest pain. Respiratory: Negative for shortness of breath. Gastrointestinal: Negative for abdominal pain Musculoskeletal: Lower back pain, which the patient states is chronic Neurological: Negative for headache 10-point ROS otherwise negative.  ____________________________________________   PHYSICAL EXAM:  Constitutional: Alert. No distress. Does appear frail. Eyes: Mild conjunctival discharge bilaterally. ENT   Head: Normocephalic and atraumatic.   Nose: No congestion/rhinnorhea.   Mouth/Throat: Dry mucous membranes. Cardiovascular: Normal rate, regular rhythm. No murmur Respiratory: Normal respiratory effort without tachypnea nor retractions. Breath sounds are clear Gastrointestinal: Soft, mild lower abdominal tenderness to palpation, no rebound or guarding. Mild distention with tympanic percussion in the upper abdomen. Musculoskeletal: Nontender with normal range of motion in all extremities. No lower extremity tenderness or edema. Neurologic:  Normal speech and language. No gross focal neurologic deficits Skin:  Skin is warm, dry and intact.  Psychiatric: Mood and affect are normal. Speech and behavior are normal.   ____________________________________________   RADIOLOGY  CT shows fecal impaction.  ____________________________________________   INITIAL IMPRESSION / ASSESSMENT AND PLAN / ED COURSE  Pertinent labs & imaging results that were available during my care of the patient were reviewed by me and considered in my medical decision making (see chart for details).  The patient presents the emergency department for failure to thrive, not eating or drinking adequately per nursing facility per EMS report. We will check labs, IV hydrate, check a urinalysis as well as an abdominal x-ray. Patient is in no distress, has no complaints at this time.  Labs show significant acute renal  dysfunction. We will IV hydrate. CT shows fecal impaction. Asians disimpacted by myself with a large amount of stool removed, we'll proceed with a soapsuds enema and admitted to the hospital for acute renal failure.  ____________________________________________   FINAL CLINICAL IMPRESSION(S) / ED DIAGNOSES  Failure to thrive Acute renal failure   Harvest Dark, MD 12/07/15 2111

## 2015-12-07 NOTE — ED Notes (Signed)
Soad suds enema completed by this RN. Patient tolerated well. Able to have two formed, brown bowel movements. Patient states he is no longer experience abdominal pain.

## 2015-12-07 NOTE — ED Triage Notes (Signed)
Per [facility], patient comes from [name] due to anorexia. Staff called EMS because patient is not eating. Patient states, "the food doesn't taste good". Staff at [facility] unable to state when patient ate last. Patient A&O x4.

## 2015-12-07 NOTE — ED Notes (Signed)
Patient transported to CT 

## 2015-12-07 NOTE — ED Notes (Signed)
Patient transported to radiology

## 2015-12-08 ENCOUNTER — Emergency Department: Payer: Medicare HMO

## 2015-12-08 ENCOUNTER — Encounter: Payer: Self-pay | Admitting: *Deleted

## 2015-12-08 DIAGNOSIS — Z8249 Family history of ischemic heart disease and other diseases of the circulatory system: Secondary | ICD-10-CM | POA: Diagnosis not present

## 2015-12-08 DIAGNOSIS — I129 Hypertensive chronic kidney disease with stage 1 through stage 4 chronic kidney disease, or unspecified chronic kidney disease: Secondary | ICD-10-CM | POA: Diagnosis present

## 2015-12-08 DIAGNOSIS — E860 Dehydration: Secondary | ICD-10-CM | POA: Diagnosis present

## 2015-12-08 DIAGNOSIS — K5641 Fecal impaction: Secondary | ICD-10-CM | POA: Diagnosis present

## 2015-12-08 DIAGNOSIS — Z800 Family history of malignant neoplasm of digestive organs: Secondary | ICD-10-CM | POA: Diagnosis not present

## 2015-12-08 DIAGNOSIS — N320 Bladder-neck obstruction: Secondary | ICD-10-CM | POA: Diagnosis present

## 2015-12-08 DIAGNOSIS — Z7982 Long term (current) use of aspirin: Secondary | ICD-10-CM | POA: Diagnosis not present

## 2015-12-08 DIAGNOSIS — N179 Acute kidney failure, unspecified: Secondary | ICD-10-CM | POA: Diagnosis present

## 2015-12-08 DIAGNOSIS — Z85840 Personal history of malignant neoplasm of eye: Secondary | ICD-10-CM | POA: Diagnosis not present

## 2015-12-08 DIAGNOSIS — Z6822 Body mass index (BMI) 22.0-22.9, adult: Secondary | ICD-10-CM | POA: Diagnosis not present

## 2015-12-08 DIAGNOSIS — E785 Hyperlipidemia, unspecified: Secondary | ICD-10-CM | POA: Diagnosis present

## 2015-12-08 DIAGNOSIS — N183 Chronic kidney disease, stage 3 (moderate): Secondary | ICD-10-CM | POA: Diagnosis present

## 2015-12-08 DIAGNOSIS — R6889 Other general symptoms and signs: Secondary | ICD-10-CM | POA: Diagnosis not present

## 2015-12-08 DIAGNOSIS — I959 Hypotension, unspecified: Secondary | ICD-10-CM | POA: Diagnosis present

## 2015-12-08 DIAGNOSIS — Z96642 Presence of left artificial hip joint: Secondary | ICD-10-CM | POA: Diagnosis present

## 2015-12-08 DIAGNOSIS — R630 Anorexia: Secondary | ICD-10-CM | POA: Diagnosis present

## 2015-12-08 DIAGNOSIS — N401 Enlarged prostate with lower urinary tract symptoms: Secondary | ICD-10-CM | POA: Diagnosis present

## 2015-12-08 DIAGNOSIS — I10 Essential (primary) hypertension: Secondary | ICD-10-CM | POA: Diagnosis not present

## 2015-12-08 DIAGNOSIS — S72002D Fracture of unspecified part of neck of left femur, subsequent encounter for closed fracture with routine healing: Secondary | ICD-10-CM | POA: Diagnosis not present

## 2015-12-08 DIAGNOSIS — Z823 Family history of stroke: Secondary | ICD-10-CM | POA: Diagnosis not present

## 2015-12-08 DIAGNOSIS — Z743 Need for continuous supervision: Secondary | ICD-10-CM | POA: Diagnosis not present

## 2015-12-08 DIAGNOSIS — Z85820 Personal history of malignant melanoma of skin: Secondary | ICD-10-CM | POA: Diagnosis not present

## 2015-12-08 DIAGNOSIS — R509 Fever, unspecified: Secondary | ICD-10-CM | POA: Diagnosis present

## 2015-12-08 DIAGNOSIS — N1330 Unspecified hydronephrosis: Secondary | ICD-10-CM | POA: Diagnosis present

## 2015-12-08 DIAGNOSIS — W19XXXD Unspecified fall, subsequent encounter: Secondary | ICD-10-CM | POA: Diagnosis not present

## 2015-12-08 DIAGNOSIS — Z9981 Dependence on supplemental oxygen: Secondary | ICD-10-CM | POA: Diagnosis not present

## 2015-12-08 DIAGNOSIS — R627 Adult failure to thrive: Secondary | ICD-10-CM | POA: Diagnosis present

## 2015-12-08 DIAGNOSIS — K219 Gastro-esophageal reflux disease without esophagitis: Secondary | ICD-10-CM | POA: Diagnosis present

## 2015-12-08 DIAGNOSIS — N138 Other obstructive and reflux uropathy: Secondary | ICD-10-CM | POA: Diagnosis not present

## 2015-12-08 DIAGNOSIS — N400 Enlarged prostate without lower urinary tract symptoms: Secondary | ICD-10-CM | POA: Diagnosis not present

## 2015-12-08 LAB — BASIC METABOLIC PANEL
ANION GAP: 12 (ref 5–15)
BUN: 73 mg/dL — ABNORMAL HIGH (ref 6–20)
CALCIUM: 8.1 mg/dL — AB (ref 8.9–10.3)
CO2: 26 mmol/L (ref 22–32)
CREATININE: 3.5 mg/dL — AB (ref 0.61–1.24)
Chloride: 100 mmol/L — ABNORMAL LOW (ref 101–111)
GFR, EST AFRICAN AMERICAN: 17 mL/min — AB (ref >60)
GFR, EST NON AFRICAN AMERICAN: 14 mL/min — AB (ref >60)
Glucose, Bld: 89 mg/dL (ref 65–99)
Potassium: 3.8 mmol/L (ref 3.5–5.1)
Sodium: 138 mmol/L (ref 135–145)

## 2015-12-08 LAB — MAGNESIUM: Magnesium: 2 mg/dL (ref 1.7–2.4)

## 2015-12-08 LAB — LACTIC ACID, PLASMA
LACTIC ACID, VENOUS: 0.8 mmol/L (ref 0.5–1.9)
LACTIC ACID, VENOUS: 0.8 mmol/L (ref 0.5–1.9)

## 2015-12-08 LAB — TROPONIN I: Troponin I: 0.05 ng/mL (ref <0.03)

## 2015-12-08 LAB — MRSA PCR SCREENING: MRSA BY PCR: NEGATIVE

## 2015-12-08 MED ORDER — TAMSULOSIN HCL 0.4 MG PO CAPS
0.4000 mg | ORAL_CAPSULE | Freq: Every day | ORAL | Status: DC
  Administered 2015-12-08 – 2015-12-10 (×3): 0.4 mg via ORAL
  Filled 2015-12-08 (×3): qty 1

## 2015-12-08 MED ORDER — AMLODIPINE BESYLATE 5 MG PO TABS
5.0000 mg | ORAL_TABLET | Freq: Every day | ORAL | Status: DC
  Administered 2015-12-08 – 2015-12-10 (×2): 5 mg via ORAL
  Filled 2015-12-08 (×2): qty 1

## 2015-12-08 MED ORDER — SODIUM CHLORIDE 0.9 % IV SOLN: INTRAVENOUS | Status: DC

## 2015-12-08 MED ORDER — ATORVASTATIN CALCIUM 20 MG PO TABS
20.0000 mg | ORAL_TABLET | Freq: Every day | ORAL | Status: DC
  Administered 2015-12-08 – 2015-12-09 (×2): 20 mg via ORAL
  Filled 2015-12-08 (×2): qty 1

## 2015-12-08 MED ORDER — ENOXAPARIN SODIUM 40 MG/0.4ML ~~LOC~~ SOLN: 40.0000 mg | SUBCUTANEOUS | Status: DC

## 2015-12-08 MED ORDER — PRO-STAT SUGAR FREE PO LIQD: 30.0000 mL | Freq: Every day | ORAL | Status: DC

## 2015-12-08 MED ORDER — MIRTAZAPINE 15 MG PO TABS
15.0000 mg | ORAL_TABLET | Freq: Every day | ORAL | Status: DC
  Administered 2015-12-08 – 2015-12-09 (×2): 15 mg via ORAL
  Filled 2015-12-08 (×2): qty 1

## 2015-12-08 MED ORDER — ENSURE ENLIVE PO LIQD
237.0000 mL | Freq: Two times a day (BID) | ORAL | Status: DC
  Administered 2015-12-09 – 2015-12-10 (×2): 237 mL via ORAL

## 2015-12-08 MED ORDER — DOCUSATE SODIUM 100 MG PO CAPS
100.0000 mg | ORAL_CAPSULE | Freq: Two times a day (BID) | ORAL | Status: DC
  Administered 2015-12-08 – 2015-12-10 (×5): 100 mg via ORAL
  Filled 2015-12-08 (×5): qty 1

## 2015-12-08 MED ORDER — ASPIRIN EC 81 MG PO TBEC
81.0000 mg | DELAYED_RELEASE_TABLET | Freq: Every day | ORAL | Status: DC
  Administered 2015-12-08 – 2015-12-10 (×3): 81 mg via ORAL
  Filled 2015-12-08 (×3): qty 1

## 2015-12-08 MED ORDER — HEPARIN SODIUM (PORCINE) 5000 UNIT/ML IJ SOLN
5000.0000 [IU] | Freq: Two times a day (BID) | INTRAMUSCULAR | Status: DC
  Administered 2015-12-08 – 2015-12-10 (×5): 5000 [IU] via SUBCUTANEOUS
  Filled 2015-12-08 (×5): qty 1

## 2015-12-08 MED ORDER — HYDROCODONE-ACETAMINOPHEN 5-325 MG PO TABS
1.0000 | ORAL_TABLET | ORAL | Status: DC | PRN
  Administered 2015-12-08 – 2015-12-09 (×2): 1 via ORAL
  Filled 2015-12-08 (×2): qty 1

## 2015-12-08 MED ORDER — ACETAMINOPHEN 325 MG PO TABS
650.0000 mg | ORAL_TABLET | ORAL | Status: DC | PRN
  Administered 2015-12-08: 650 mg via ORAL
  Filled 2015-12-08: qty 2

## 2015-12-08 MED ORDER — PANTOPRAZOLE SODIUM 40 MG PO TBEC
40.0000 mg | DELAYED_RELEASE_TABLET | Freq: Every day | ORAL | Status: DC
  Administered 2015-12-08 – 2015-12-10 (×3): 40 mg via ORAL
  Filled 2015-12-08 (×3): qty 1

## 2015-12-08 MED ORDER — SODIUM CHLORIDE 0.9 % IV SOLN
INTRAVENOUS | Status: DC
  Administered 2015-12-08: 04:00:00 via INTRAVENOUS

## 2015-12-08 MED ORDER — ONDANSETRON HCL 4 MG/2ML IJ SOLN: 4.0000 mg | INTRAMUSCULAR | Status: DC | PRN

## 2015-12-08 NOTE — Progress Notes (Signed)
Patient admit from ED. Patient currently at [facility] for rehab due to post hip surgery.Left hip incision staples intact. Honeycomb dressing applied. Patient lives at home with wife. Patient expresses concerns for wife is getting chemo treatment.

## 2015-12-08 NOTE — Evaluation (Signed)
Physical Therapy Evaluation Patient Details Name: Matthew [name] MRN: [id number] DOB: 1924/05/09 Today's Date: 12/08/2015   History of Present Illness  Matthew [name] is a 82 y.o. male with a past medical history of CKD, hypertension, who presents the emergency department from skilled nursing facility for decreased oral intake. Pt has been at [facility] facility since L hip hemiarthroplasty 11/11/15 s/p mechanical fall with femoral neck fracture. They have noted that the patient is not eating or drinking. Patient states his food does not taste good which is why he is not eating. And states that it feels like his mouth is always to dry. Pt reports multiple falls over the last 12 months. Pt reports he has been working with physical therapy at SNF including ambulation with rolling walker.   Clinical Impression  Pt requiring maxA+1 for bed mobility and modA+2 for sit to stand transfers. Once upright is able to remain standing with UE support however with intermittent LOB requiring external correction. Pt with poor safety awareness. He moves very slow and requires cues for turns. Pt requires continual reminder regarding L posterior hip precautions. Pt will need to return to SNF at discharge in order to facilitate safe return to prior level of function at home.     Follow Up Recommendations SNF;Other (comment) (Return to [facility])    Equipment Recommendations  None recommended by PT    Recommendations for Other Services       Precautions / Restrictions Precautions Precautions: Fall;Posterior Hip Precaution Comments: Not in chart but pt underwent posterior approach L hemiarthroplasty 11/11/15 with posterior hip precautions Restrictions Weight Bearing Restrictions: No      Mobility  Bed Mobility Overal bed mobility: Needs Assistance;+2 for physical assistance Bed Mobility: Supine to Sit     Supine to sit: Max assist     General bed mobility comments: Pt  requires heavy cues and direction for bed mobility. Heavy assist from therapist due to weakness and poor sequencing. Cues to avoid violating posterior hip precautions  Transfers Overall transfer level: Needs assistance Equipment used: Rolling walker (2 wheeled) Transfers: Sit to/from Stand Sit to Stand: Mod assist;+2 physical assistance         General transfer comment: Pt with poor anterior weight shifting. Leans posteriorly and comes up slowly indicating decreased LE strength/power  Ambulation/Gait Ambulation/Gait assistance: Min assist;+2 physical assistance;+2 safety/equipment Ambulation Distance (Feet): 30 Feet Assistive device: Rolling walker (2 wheeled) Gait Pattern/deviations: Decreased step length - right;Decreased step length - left;Shuffle Gait velocity: Decreased Gait velocity interpretation: <1.8 ft/sec, indicative of risk for recurrent falls General Gait Details: Pt ambulates to door and back to recliner with short shuffling steps. Intermittent LOB with correction by therapist. Poor sequencing with turning and poor ability to avoid violating hip precautions. Pt with poor safety awareness and requires considerable cues for safety during ambulation. SaO2 remains >90% on 2L/min O2. Attempted pt on room air during bed exercises however SaO2 drops to 88% and does not recover  Stairs            Wheelchair Mobility    Modified Rankin (Stroke Patients Only)       Balance Overall balance assessment: History of Falls;Needs assistance Sitting-balance support: No upper extremity supported Sitting balance-Leahy Scale: Fair     Standing balance support: No upper extremity supported Standing balance-Leahy Scale: Poor  Pertinent Vitals/Pain Pain Assessment: 0-10 Pain Score: 2  Pain Location: L hip Pain Descriptors / Indicators: Aching Pain Intervention(s): Monitored during session;Other (comment);Repositioned (RN refuses pain  meds)    Home Living Family/patient expects to be discharged to:: Skilled nursing facility                 Additional Comments: When pt returns home he has 7 steps to enter with L rail on the front and 2 steps with no rails.  Pt was not previously using any assitive device to ambulate.    Prior Function Level of Independence: Independent         Comments: Pt is able to drive, do yard work, light activities in the home. Not previously wearing O2     Hand Dominance   Dominant Hand: Right    Extremity/Trunk Assessment   Upper Extremity Assessment: Generalized weakness           Lower Extremity Assessment: Generalized weakness         Communication   Communication: No difficulties  Cognition Arousal/Alertness: Awake/alert Behavior During Therapy: WFL for tasks assessed/performed Overall Cognitive Status: Within Functional Limits for tasks assessed                      General Comments      Exercises General Exercises - Lower Extremity Ankle Circles/Pumps: Strengthening;Both;10 reps;Supine Quad Sets: Strengthening;Both;10 reps;Supine Gluteal Sets: Strengthening;Both;10 reps;Supine Short Arc Quad: Strengthening;Both;10 reps;Supine Heel Slides: Strengthening;Both;10 reps;Supine Hip ABduction/ADduction: Strengthening;Both;10 reps;Supine Straight Leg Raises: Strengthening;Both;10 reps;Supine      Assessment/Plan    PT Assessment Patient needs continued PT services  PT Diagnosis Difficulty walking;Abnormality of gait;Generalized weakness   PT Problem List Decreased strength;Decreased range of motion;Decreased activity tolerance;Decreased balance;Decreased mobility;Pain  PT Treatment Interventions DME instruction;Gait training;Stair training;Therapeutic activities;Therapeutic exercise;Balance training;Neuromuscular re-education;Patient/family education;Manual techniques   PT Goals (Current goals can be found in the Care Plan section) Acute Rehab PT  Goals Patient Stated Goal: Return to SNF to complete rehab PT Goal Formulation: With patient Time For Goal Achievement: 12/22/15 Potential to Achieve Goals: Fair    Frequency Min 2X/week   Barriers to discharge        Co-evaluation               End of Session Equipment Utilized During Treatment: Gait belt Activity Tolerance: Patient tolerated treatment well Patient left: in chair;with call bell/phone within reach;with chair alarm set;with SCD's reapplied Nurse Communication: Mobility status         Time: [id number] PT Time Calculation (min) (ACUTE ONLY): 34 min   Charges:   PT Evaluation $PT Eval Low Complexity: 1 Procedure PT Treatments $Therapeutic Exercise: 8-22 mins   PT G Codes:       Lyndel Safe Emmersyn Kratzke PT, DPT   Khailee Mick 12/08/2015, 12:08 PM

## 2015-12-08 NOTE — Progress Notes (Signed)
Received ordered from MD to I&O cath once. Notified oncoming nurse of new order. Output pending per night nurse collection.

## 2015-12-08 NOTE — Consult Note (Signed)
Date: 12/08/2015                  Patient Name:  Matthew [name]  MRN: [id number]  DOB: [birth year]  Age / Sex: 82 y.o., male         PCP: Loura Pardon, MD                 Service Requesting Consult: Internal medicine                 Reason for Consult: Acute renal failure            History of Present Illness: Patient is a 82 y.o. male with medical problems of chronic constipation,left hip hemiarthroplasty November 11, 2015,[facility] nursing home resident, who was admitted to [facility] on 12/07/2015 for evaluation of anorexia.  In the emergency room, CT scan of the abdomen revealed large stone burden, chronic constipation, fecal impaction, mild bilateral hydro nephrosis, urinary bladder distention, enlarged prostate gland atherosclerosis and coronary calcifications Labs showed creatinine of 3.76.  His baseline creatinine is 1.01 from November 14, 2015 Patient was therefore admitted for acute renal failure and nephrology consult has been requested for evaluation In and out catheterization was done.  Clearurine was obtained.  Urinalysis is negative. Patient is not sure if he has voided this morning   Medications: Outpatient medications: Prescriptions Prior to Admission  Medication Sig Dispense Refill Last Dose  . amLODipine (NORVASC) 5 MG tablet Take 1 tablet (5 mg total) by mouth daily. 30 tablet 0   . aspirin 81 MG tablet Take 81 mg by mouth daily.     11/11/2015 at 0830  . atorvastatin (LIPITOR) 40 MG tablet Take 20 mg by mouth daily.    11/11/2015 at 0830  . docusate sodium (COLACE) 100 MG capsule Take 1 capsule (100 mg total) by mouth 2 (two) times daily. 10 capsule 0   . enoxaparin (LOVENOX) 40 MG/0.4ML injection Inject 0.4 mLs (40 mg total) into the skin daily. 14 Syringe 0   . oxyCODONE (OXY IR/ROXICODONE) 5 MG immediate release tablet Take 1-2 tablets (5-10 mg total) by mouth every 3 (three) hours as needed for breakthrough pain. 30 tablet 0   . pantoprazole (PROTONIX) 40 MG tablet Take  1 tablet (40 mg total) by mouth daily. 30 tablet 0     Current medications: Current Facility-Administered Medications  Medication Dose Route Frequency Provider Last Rate Last Dose  . 0.9 %  sodium chloride infusion   Intravenous Continuous Alexis Hugelmeyer, DO 75 mL/hr at 12/08/15 0400    . acetaminophen (TYLENOL) tablet 650 mg  650 mg Oral Q4H PRN Alexis Hugelmeyer, DO   650 mg at 12/08/15 0726  . amLODipine (NORVASC) tablet 5 mg  5 mg Oral Daily Alexis Hugelmeyer, DO   5 mg at 12/08/15 1028  . aspirin EC tablet 81 mg  81 mg Oral Daily Alexis Hugelmeyer, DO   81 mg at 12/08/15 1028  . atorvastatin (LIPITOR) tablet 20 mg  20 mg Oral QHS Alexis Hugelmeyer, DO      . docusate sodium (COLACE) capsule 100 mg  100 mg Oral BID Alexis Hugelmeyer, DO   100 mg at 12/08/15 1028  . heparin injection 5,000 Units  5,000 Units Subcutaneous Q12H Alexis Hugelmeyer, DO   5,000 Units at 12/08/15 1028  . HYDROcodone-acetaminophen (NORCO/VICODIN) 5-325 MG per tablet 1-2 tablet  1-2 tablet Oral Q4H PRN Alexis Hugelmeyer, DO      . mirtazapine (REMERON) tablet 15 mg  15 mg Oral QHS Lytle But[place], MD      . ondansetron [facility]) injection 4 mg  4 mg Intravenous Q4H PRN Alexis Hugelmeyer, DO      . pantoprazole (PROTONIX) EC tablet 40 mg  40 mg Oral Daily Alexis Hugelmeyer, DO   40 mg at 12/08/15 1028      Allergies: Allergies  Allergen Reactions  . Chlorthalidone     REACTION: frequent urination  . Codeine     REACTION: nausea  . Iodine     REACTION: caused bubble to come up in eye  . Penicillins     REACTION: GI  . Ramipril     REACTION: cough  . Ropinirole Hydrochloride     REACTION: insomnia      Past Medical History: Past Medical History:  Diagnosis Date  . Allergic rhinitis   . Chronic constipation   . Chronic kidney disease    stage 3, [place] benign appearing cysts 05/2006  . History of CT scan of chest 6/09   calcification consistent with asbestos exp, also pleural nodule, incidental  gallstones  . History of transfusion of whole blood   . Hypertension   . Melanoma ([place])    history of, by right eye  . Pulmonary nodules    being obs with serial CT, 6 month f/u stable 10/05  . Renal insufficiency      Past Surgical History: Past Surgical History:  Procedure Laterality Date  . 2D echo  6/09   slight thickening of aortic valve with EF of 60%  . GANGLION CYST EXCISION     hands  . HIP ARTHROPLASTY Left 11/11/2015   Procedure: ARTHROPLASTY BIPOLAR HIP (HEMIARTHROPLASTY);  Surgeon: Corky Mull, MD;  Location: [facility] ORS;  Service: Orthopedics;  Laterality: Left;     Family History: Family History  Problem Relation Age of Onset  . Hypertension Mother   . Hypertension Father   . Stroke Father   . Cancer Brother     pancreatic     Social History: Social History   Social History  . Marital status: Married    Spouse name: N/A  . Number of children: N/A  . Years of education: N/A   Occupational History  . Not on file.   Social History Main Topics  . Smoking status: Former Research scientist (life sciences)  . Smokeless tobacco: Not on file     Comment: Quit smoking in 1957.  Marland Kitchen Alcohol use No  . Drug use: No  . Sexual activity: Not on file   Other Topics Concern  . Not on file   Social History Narrative   Plays music with gospel/ bluegrass group     Review of Systems: Gen: denies fevers, chills HEENT: denies vision or hearing problems CV: no chest pain or shortness of breath Resp: No cough or sputum production GI: constipation, decreased appetite, denies blood in the stool GU : denies difficulty urination, denies blood in the urine MS: recent left hip surgery Derm:  no complaints Psych:no complaints Heme: no complaints Neuro: no complaints Endocrine.  No complaints  Vital Signs: Blood pressure 108/61, pulse 84, temperature 97.9 F (36.6 C), temperature source Oral, resp. rate 16, height 5\' 8"  (1.727 m), weight 65.8 kg (145 lb), SpO2 97 %.  No intake or output  data in the 24 hours ending 12/08/15 1152  Weight trends: Filed Weights   12/07/15 1557  Weight: 65.8 kg (145 lb)    Physical Exam: General:  lderly, frail gentleman, laying in the  bed  HEENT Anicteric, moist mucous membranes  Neck:  supple  Lungs: Clear bilaterally, normal breathing effort  Heart:: regular rate and rhythm, no rub or gallop  Abdomen: Soft, nontender, nondistended  Extremities:  no edema  Neurologic: alert, oriented  Skin: No acute rashes             Lab results: Basic Metabolic Panel:  Recent Labs Lab 12/07/15 1612 12/08/15 0721  NA 139 138  K 3.8 3.8  CL 97* 100*  CO2 28 26  GLUCOSE 103* 89  BUN 72* 73*  CREATININE 3.76* 3.50*  CALCIUM 9.0 8.1*  MG  --  2.0    Liver Function Tests:  Recent Labs Lab 12/07/15 1612  AST 19  ALT 17  ALKPHOS 91  BILITOT 1.2  PROT 6.7  ALBUMIN 3.2*    Recent Labs Lab 12/07/15 1612  LIPASE 26   No results for input(s): AMMONIA in the last 168 hours.  CBC:  Recent Labs Lab 12/07/15 1612  WBC 20.9*  HGB 11.0*  HCT 33.3*  MCV 88.7  PLT 366    Cardiac Enzymes:  Recent Labs Lab 12/07/15 1612 12/08/15 0300  CKTOTAL 20*  --   TROPONINI 0.05* 0.05*    BNP: Invalid input(s): POCBNP  CBG: No results for input(s): GLUCAP in the last 168 hours.  Microbiology: Recent Results (from the past 720 hour(s))  Surgical PCR screen     Status: None   Collection Time: 11/11/15  1:59 PM  Result Value Ref Range Status   MRSA, PCR NEGATIVE NEGATIVE Final   Staphylococcus aureus NEGATIVE NEGATIVE Final    Comment:        The Xpert SA Assay (FDA approved for NASAL specimens in patients over 52 years of age), is one component of a comprehensive surveillance program.  Test performance has been validated by [facility] for patients greater than or equal to 69 year old. It is not intended to diagnose infection nor to guide or monitor treatment.   MRSA PCR Screening     Status: None   Collection  Time: 12/08/15  3:35 AM  Result Value Ref Range Status   MRSA by PCR NEGATIVE NEGATIVE Final    Comment:        The GeneXpert MRSA Assay (FDA approved for NASAL specimens only), is one component of a comprehensive MRSA colonization surveillance program. It is not intended to diagnose MRSA infection nor to guide or monitor treatment for MRSA infections.      Coagulation Studies: No results for input(s): LABPROT, INR in the last 72 hours.  Urinalysis:  Recent Labs  12/07/15 1700  COLORURINE YELLOW*  LABSPEC 1.011  PHURINE 5.0  GLUCOSEU NEGATIVE  HGBUR NEGATIVE  BILIRUBINUR NEGATIVE  KETONESUR NEGATIVE  PROTEINUR NEGATIVE  NITRITE NEGATIVE  LEUKOCYTESUR NEGATIVE        Imaging: Ct Abdomen Pelvis Wo Contrast  Result Date: 12/07/2015 CLINICAL DATA:  82 year old with anorexia and bowel distention on radiograph. EXAM: CT ABDOMEN AND PELVIS WITHOUT CONTRAST TECHNIQUE: Multidetector CT imaging of the abdomen and pelvis was performed following the standard protocol without IV contrast. COMPARISON:  Radiographs earlier this day.  CT 04/23/2013 FINDINGS: Lower chest: Right basilar atelectasis. Scattered hyperdensities within the atelectatic lung. Coronary artery calcifications with upper normal heart size. Scattered calcified pleural plaques suspected. Liver: Calcified granulomas in the right lobe. Hepatobiliary: Clips in the gallbladder fossa postcholecystectomy. No biliary dilatation. Pancreas: No ductal dilatation or inflammation. Spleen: Normal. Adrenal glands: No nodule.  Thickening on the  left unchanged. Kidneys: Mild bilateral hydroureteronephrosis. No obstructing stone. This may be secondary to bladder distention. There is a 1.4 cm cyst in the upper right kidney. Small hyperdense focus in the mid right renal cortex likely a hyperdense cyst. Stomach/Bowel: There is enteric contrast in the distal esophagus. No distal esophageal wall thickening. Stomach physiologically distended  with enteric contrast. There are no dilated or thickened small bowel loops. Large stool burden with significant colonic tortuosity consistent with constipation. There is a stool ball distending the rectum, 9.4 cm transverse with mild distal rectal wall thickening. There is otherwise no colonic wall thickening. Cecum is high-riding in the right upper quadrant, unchanged. Enteric contrast just reaches the cecum. Vascular/Lymphatic: No retroperitoneal adenopathy. Abdominal aorta is normal in caliber. Moderate atherosclerosis of the abdominal aorta and its branches without aneurysm. Reproductive: Prominent sized prostate gland spanning 6.5 cm transverse. Soft tissue density in the right inguinal canal is nonspecific, suspect varicoceles. Bladder: Distended to the level of the umbilicus. No bladder wall thickening. Other: No free air, free fluid, or intra-abdominal fluid collection. Musculoskeletal: There are no acute or suspicious osseous abnormalities. Left hip arthroplasty. IMPRESSION: 1. Large stool burden with colonic redundancy consistent with constipation. Stool ball distending the rectum with wall thickening suggesting fecal impaction. No small bowel obstruction. 2. Mild bilateral hydronephrosis is likely secondary to urinary bladder distention. Prostate gland is enlarged, question chronic bladder outlet obstruction. 3. Enteric contrast in the distal esophagus, reflux versus slow transit. 4. Atherosclerosis including coronary artery calcifications. 5. Additional chronic findings as described. Electronically Signed   By: Jeb Levering M.D.   On: 12/07/2015 20:30   Dg Chest 1 View  Result Date: 12/08/2015 CLINICAL DATA:  Initial evaluation for acute fever.  Anorexia. EXAM: CHEST 1 VIEW COMPARISON:  Prior radiograph from 12/07/2015. FINDINGS: Cardiac and mediastinal silhouettes are stable in size and contour, and remain within normal limits. Atheromatous plaque within the intrathoracic aorta. Lungs are  hypoinflated. Mild bibasilar subsegmental atelectasis. No consolidative airspace disease. No pulmonary edema or pleural effusion. No pneumothorax. Prominent gaseous distention of several loops of large and possibly small bowel seen within the upper abdomen, similar to previous. No acute osseous abnormality. Osteopenia noted. Multiple remote left-sided rib fractures noted, stable. IMPRESSION: 1. Shallow lung inflation with mild bibasilar subsegmental atelectasis. No other active cardiopulmonary disease. 2. Prominent gaseous distention of multiple loops of bowel (predominantly colon) within the visualized upper abdomen, similar to prior. 3. Aortic atherosclerosis. Electronically Signed   By: Jeannine Boga M.D.   On: 12/08/2015 02:35   Dg Abdomen Acute W/chest  Result Date: 12/07/2015 CLINICAL DATA:  Anorexia. EXAM: DG ABDOMEN ACUTE W/ 1V CHEST COMPARISON:  04/23/2013. FINDINGS: The lungs are clear wiithout focal pneumonia, edema, pneumothorax or pleural effusion. Interstitial markings are diffusely coarsened with chronic features. The cardiopericardial silhouette is within normal limits for size. Marked gaseous distension of large and small bowel is identified in the abdomen with prominent stool volume noted in the rectum. Bones are diffusely demineralized. Patient is status post left hip replacement. IMPRESSION: 1. No acute cardiopulmonary findings. 2. Marked gaseous dilatation of large and small bowel, but predominantly colon. Imaging features could be compatible with severe colonic dysmotility although large stool volume in the rectum raises concern for fecal impaction. Electronically Signed   By: Misty Stanley M.D.   On: 12/07/2015 16:38      Assessment & Plan: Pt is a 82 y.o. yo male with  medical problems of chronic constipation,left hip hemiarthroplasty November 11, 2015,[facility] resident, who was admitted to [facility] on 12/07/2015 for evaluation of anorexia.   1.  Acute renal  failure CT of the abdomen showed bladder distention, mild hydronephrosis, and enlarged prostate gland Will monitor urinary retention with periodic bladder scan If urinary retention and significant, will need chronic Foley or in and out catheterization Avoid oxycodone if possible Monitor electrolytes and serum creatinine daily  2.  BPH Consider starting Flomax  3.  Chronic constipation with fecal impaction Fecal disimpaction done in the emergency room

## 2015-12-08 NOTE — H&P (Signed)
  EPIC DOWN DURING ADMISSION - H&P PRINTED AND ON CHART -Matthew Sanjuan, DO

## 2015-12-08 NOTE — Progress Notes (Signed)
Receive TC from patient's spouse to set up password, educated spouse that each admission new password to be issued, verbal understanding and notify other family members, due to [place] mandates limited information could be given.

## 2015-12-08 NOTE — Progress Notes (Signed)
Received TC from patient's sister requested information concerning patient. Explained that password was not in place and could give any information and try to notify spouse for any information. Sister very irate with this Probation officer.

## 2015-12-08 NOTE — Progress Notes (Signed)
Patient has been dry in brief last two rounds per NT. Bladder scanned patient to reveal 771mls, paged MD, awaiting response.

## 2015-12-08 NOTE — Progress Notes (Signed)
Initial Nutrition Assessment  DOCUMENTATION CODES:   Not applicable  INTERVENTION:   -Ensure Enlive po BID, each supplement provides 350 kcal and 20 grams of protein -30 ml Prostat daily, each supplement provides 100 kcals and 15 grams protein  NUTRITION DIAGNOSIS:   Inadequate oral intake related to poor appetite as evidenced by meal completion < 50%, per patient/family report.  GOAL:   Patient will meet greater than or equal to 90% of their needs  MONITOR:   PO intake, Supplement acceptance, Labs, Weight trends, Skin, I & O's  REASON FOR ASSESSMENT:   Malnutrition Screening Tool    ASSESSMENT:   Matthew [name] is a 82 y.o. male with a past medical history of CKD, hypertension, who presents the emergency department from skilled nursing facility for decreased oral intake. Pt has been at [facility] facility since L hip hemiarthroplasty 11/11/15 s/p mechanical fall with femoral neck fracture.  Pt admitted with with decreased appetite and poor oral intake. Pt is a resident of [name] s/p hip fx and repair.   Attempted to speak with pt x 2, however, pt in with MD and physical therapy at times of visits.   Reviewed MAR from [name]. Pt was receiving a heart healthy diet PTA. Pt was also receiving KCl, vitamin D, and MedPass 2.0 120 ml 2 times daily and 90 ml 3 times daily (provides approximately 365 kcals and 15 grams protein daily).   Wt hx reviewed. Wt has been stable over the past month.   Per physical therapy, pt reports food does not taste good and does not have a good appetite. Pt would benefit from nutritional supplements. RD to order.   Medications reviewed. Noted remeron ordered on 12/08/15.  Labs reviewed.  Diet Order:  Diet Heart Room service appropriate? Yes; Fluid consistency: Thin  Skin:  Reviewed, no issues  Last BM:  12/08/15  Height:   Ht Readings from Last 1 Encounters:  12/07/15 5\' 8"  (1.727 m)    Weight:   Wt  Readings from Last 1 Encounters:  12/07/15 145 lb (65.8 kg)    Ideal Body Weight:  70 kg  BMI:  Body mass index is 22.05 kg/m.  Estimated Nutritional Needs:   Kcal:  1650-1850  Protein:  85-100 grams  Fluid:  1.6-1.8 L  EDUCATION NEEDS:   No education needs identified at this time  Madasyn Heath A. Jimmye Norman, RD, LDN, CDE Pager: [phone] After hours Pager: [phone]

## 2015-12-08 NOTE — Progress Notes (Signed)
Returned call for report.

## 2015-12-08 NOTE — Progress Notes (Signed)
Patient has no floor orders. Prime doc paged.

## 2015-12-08 NOTE — Progress Notes (Signed)
[facility] NAME: Matthew [name]    MRN#:  [id number]  DATE OF BIRTH:  09/14/1924  SUBJECTIVE:  Hospital Day: 0 days Matthew [name] is a 82 y.o. male presenting with Anorexia .   Overnight events: No acute overnight events Interval Events: Sleeping this morning, easily arousable no complaints states he simply had a poor appetite  REVIEW OF SYSTEMS:  CONSTITUTIONAL: No fever, Positive fatigue or weakness.  EYES: No blurred or double vision.  EARS, NOSE, AND THROAT: No tinnitus or ear pain.  RESPIRATORY: No cough, shortness of breath, wheezing or hemoptysis.  CARDIOVASCULAR: No chest pain, orthopnea, edema.  GASTROINTESTINAL: No nausea, vomiting, diarrhea or abdominal pain.  GENITOURINARY: No dysuria, hematuria.  ENDOCRINE: No polyuria, nocturia,  HEMATOLOGY: No anemia, easy bruising or bleeding SKIN: No rash or lesion. MUSCULOSKELETAL: No joint pain or arthritis.   NEUROLOGIC: No tingling, numbness, weakness.  PSYCHIATRY: No anxiety or depression.   DRUG ALLERGIES:   Allergies  Allergen Reactions  . Chlorthalidone     REACTION: frequent urination  . Codeine     REACTION: nausea  . Iodine     REACTION: caused bubble to come up in eye  . Penicillins     REACTION: GI  . Ramipril     REACTION: cough  . Ropinirole Hydrochloride     REACTION: insomnia    VITALS:  Blood pressure (!) 98/51, pulse 87, temperature 98.2 F (36.8 C), temperature source Oral, resp. rate 20, height 5\' 8"  (1.727 m), weight 65.8 kg (145 lb), SpO2 97 %.  PHYSICAL EXAMINATION:  VITAL SIGNS: Vitals:   12/08/15 0352 12/08/15 0800  BP: 135/62 (!) 98/51  Pulse: 89 87  Resp: 20   Temp: 98.2 F (36.8 C)    GENERAL:82 y.o.male currently in no acute distress. Frail appearing HEAD: Normocephalic, atraumatic.  EYES: Pupils equal, round, reactive to light. Extraocular muscles intact. No scleral icterus.  MOUTH: Moist mucosal membrane.  Dentition intact. No abscess noted.  EAR, NOSE, THROAT: Clear without exudates. No external lesions.  NECK: Supple. No thyromegaly. No nodules. No JVD.  PULMONARY: Clear to ascultation, without wheeze rails or rhonci. No use of accessory muscles, Good respiratory effort. good air entry bilaterally CHEST: Nontender to palpation.  CARDIOVASCULAR: S1 and S2. Regular rate and rhythm. No murmurs, rubs, or gallops. No edema. Pedal pulses 2+ bilaterally.  GASTROINTESTINAL: Soft, nontender, nondistended. No masses. Positive bowel sounds. No hepatosplenomegaly.  MUSCULOSKELETAL: No swelling, clubbing, or edema. Range of motion full in all extremities.  NEUROLOGIC: Cranial nerves II through XII are intact. No gross focal neurological deficits. Sensation intact. Reflexes intact.  SKIN: No ulceration, lesions, rashes, or cyanosis. Skin warm and dry. Turgor intact.  PSYCHIATRIC: Mood, affect within normal limits. The patient is awake, alert and oriented x 3. Insight, judgment intact.      LABORATORY PANEL:   CBC  Recent Labs Lab 12/07/15 1612  WBC 20.9*  HGB 11.0*  HCT 33.3*  PLT 366   ------------------------------------------------------------------------------------------------------------------  Chemistries   Recent Labs Lab 12/07/15 1612 12/08/15 0721  NA 139 138  K 3.8 3.8  CL 97* 100*  CO2 28 26  GLUCOSE 103* 89  BUN 72* 73*  CREATININE 3.76* 3.50*  CALCIUM 9.0 8.1*  MG  --  2.0  AST 19  --   ALT 17  --   ALKPHOS 91  --   BILITOT 1.2  --    ------------------------------------------------------------------------------------------------------------------  Cardiac Enzymes  Recent Labs  Lab 12/08/15 0300  TROPONINI 0.05*   ------------------------------------------------------------------------------------------------------------------  RADIOLOGY:  Ct Abdomen Pelvis Wo Contrast  Result Date: 12/07/2015 CLINICAL DATA:  82 year old with anorexia and bowel distention  on radiograph. EXAM: CT ABDOMEN AND PELVIS WITHOUT CONTRAST TECHNIQUE: Multidetector CT imaging of the abdomen and pelvis was performed following the standard protocol without IV contrast. COMPARISON:  Radiographs earlier this day.  CT 04/23/2013 FINDINGS: Lower chest: Right basilar atelectasis. Scattered hyperdensities within the atelectatic lung. Coronary artery calcifications with upper normal heart size. Scattered calcified pleural plaques suspected. Liver: Calcified granulomas in the right lobe. Hepatobiliary: Clips in the gallbladder fossa postcholecystectomy. No biliary dilatation. Pancreas: No ductal dilatation or inflammation. Spleen: Normal. Adrenal glands: No nodule.  Thickening on the left unchanged. Kidneys: Mild bilateral hydroureteronephrosis. No obstructing stone. This may be secondary to bladder distention. There is a 1.4 cm cyst in the upper right kidney. Small hyperdense focus in the mid right renal cortex likely a hyperdense cyst. Stomach/Bowel: There is enteric contrast in the distal esophagus. No distal esophageal wall thickening. Stomach physiologically distended with enteric contrast. There are no dilated or thickened small bowel loops. Large stool burden with significant colonic tortuosity consistent with constipation. There is a stool ball distending the rectum, 9.4 cm transverse with mild distal rectal wall thickening. There is otherwise no colonic wall thickening. Cecum is high-riding in the right upper quadrant, unchanged. Enteric contrast just reaches the cecum. Vascular/Lymphatic: No retroperitoneal adenopathy. Abdominal aorta is normal in caliber. Moderate atherosclerosis of the abdominal aorta and its branches without aneurysm. Reproductive: Prominent sized prostate gland spanning 6.5 cm transverse. Soft tissue density in the right inguinal canal is nonspecific, suspect varicoceles. Bladder: Distended to the level of the umbilicus. No bladder wall thickening. Other: No free air, free  fluid, or intra-abdominal fluid collection. Musculoskeletal: There are no acute or suspicious osseous abnormalities. Left hip arthroplasty. IMPRESSION: 1. Large stool burden with colonic redundancy consistent with constipation. Stool ball distending the rectum with wall thickening suggesting fecal impaction. No small bowel obstruction. 2. Mild bilateral hydronephrosis is likely secondary to urinary bladder distention. Prostate gland is enlarged, question chronic bladder outlet obstruction. 3. Enteric contrast in the distal esophagus, reflux versus slow transit. 4. Atherosclerosis including coronary artery calcifications. 5. Additional chronic findings as described. Electronically Signed   By: Jeb Levering M.D.   On: 12/07/2015 20:30   Dg Chest 1 View  Result Date: 12/08/2015 CLINICAL DATA:  Initial evaluation for acute fever.  Anorexia. EXAM: CHEST 1 VIEW COMPARISON:  Prior radiograph from 12/07/2015. FINDINGS: Cardiac and mediastinal silhouettes are stable in size and contour, and remain within normal limits. Atheromatous plaque within the intrathoracic aorta. Lungs are hypoinflated. Mild bibasilar subsegmental atelectasis. No consolidative airspace disease. No pulmonary edema or pleural effusion. No pneumothorax. Prominent gaseous distention of several loops of large and possibly small bowel seen within the upper abdomen, similar to previous. No acute osseous abnormality. Osteopenia noted. Multiple remote left-sided rib fractures noted, stable. IMPRESSION: 1. Shallow lung inflation with mild bibasilar subsegmental atelectasis. No other active cardiopulmonary disease. 2. Prominent gaseous distention of multiple loops of bowel (predominantly colon) within the visualized upper abdomen, similar to prior. 3. Aortic atherosclerosis. Electronically Signed   By: Jeannine Boga M.D.   On: 12/08/2015 02:35   Dg Abdomen Acute W/chest  Result Date: 12/07/2015 CLINICAL DATA:  Anorexia. EXAM: DG ABDOMEN ACUTE W/  1V CHEST COMPARISON:  04/23/2013. FINDINGS: The lungs are clear wiithout focal pneumonia, edema, pneumothorax or pleural effusion. Interstitial markings are diffusely  coarsened with chronic features. The cardiopericardial silhouette is within normal limits for size. Marked gaseous distension of large and small bowel is identified in the abdomen with prominent stool volume noted in the rectum. Bones are diffusely demineralized. Patient is status post left hip replacement. IMPRESSION: 1. No acute cardiopulmonary findings. 2. Marked gaseous dilatation of large and small bowel, but predominantly colon. Imaging features could be compatible with severe colonic dysmotility although large stool volume in the rectum raises concern for fecal impaction. Electronically Signed   By: Misty Stanley M.D.   On: 12/07/2015 16:38    EKG:   Orders placed or performed during the hospital encounter of 11/11/15  . ED EKG  . ED EKG  . EKG 12-Lead  . EKG 12-Lead  . EKG    ASSESSMENT AND PLAN:   Matthew [name] is a 82 y.o. male presenting with Anorexia . Admitted 12/07/2015 : Day #: 0 days 1. Acute renal failure prerenal versus ATN: IV fluid hydration consult nephrology 2. Failure to thrive/poor appetite: Dietary consult, add Remeron 3. GERD without esophagitis: PPI therapy Essential hypertension: Norvasc Hyperlipidemia unspecified Lipitor  Physical therapy evaluation   All the records are reviewed and case discussed with Care Management/Social Workerr. Management plans discussed with the patient, family and they are in agreement.  CODE STATUS: full TOTAL TIME TAKING CARE OF THIS PATIENT: 33 minutes.   POSSIBLE D/C IN 2-3DAYS, DEPENDING ON CLINICAL CONDITION.   Hower,  Karenann Cai.D on 12/08/2015 at 9:30 AM  Between 7am to 6pm - Pager - [phone]  After 6pm: House Pager: - [facility]  Office  [phone]  CC: Primary care physician; Loura Pardon, MD

## 2015-12-08 NOTE — Clinical Social Work Note (Signed)
Clinical Social Work Assessment  Patient Details  Name: Matthew [name] MRN: [id number] Date of Birth: May 14, 1924  Date of referral:  12/08/15               Reason for consult:  Facility Placement, Other (Comment Required) (From [name] for [facility]. )                Permission sought to share information with:  Chartered certified accountant granted to share information::  Yes, Verbal Permission Granted  Name::      Fish farm manager::   [place]   Relationship::     Contact Information:     Housing/Transportation Living arrangements for the past 2 months:  [place] of Information:  Patient Patient Interpreter Needed:  None Criminal Activity/Legal Involvement Pertinent to Current Situation/Hospitalization:  No - Comment as needed Significant Relationships:  Siblings, Spouse Lives with:  Spouse Do you feel safe going back to the place where you live?  Yes Need for family participation in patient care:  Yes (Comment)  Care giving concerns:  Patient is a short term rehab resident from [name].    Social Worker assessment / plan:  Holiday representative (CSW) reviewed chart and noted that patient is from [name] for short term rehab. CSW contacted Matthew [name] admissions coordinator at [facility]. Per [name] patient is from [place] and can return when stable. Per [name] he will start [name] authorization today and will accept a 5 day LOG if patient is ready before authorization is received. CSW met with patient and RN was at bedside providing care. Patient was alert and oriented and was laying in the bed. Patient reported that he lives in [place] with his wife and is agreeable to return to [name]. Per patient his wife was recently diagnosed with cancer and started her chemo treatment today. Patient reported that his sister is with his wife today providing support. CSW explained that [name]  authorization will have to received again. Patient verbalized his understanding.   FL2 complete and faxed out. CSW will continue to follow and assist as needed.   Employment status:  Retired Nurse, adult PT Recommendations:  [place] / Referral to community resources:  Center  Patient/Family's Response to care:  Patient is agreeable to return to [name].   Patient/Family's Understanding of and Emotional Response to Diagnosis, Current Treatment, and Prognosis:  Patient was very pleasant and thanked CSW for visit. CSW provided support and dicussed patient's wife recent cancer diagnosis.   Emotional Assessment Appearance:  Appears stated age Attitude/Demeanor/Rapport:    Affect (typically observed):  Accepting, Adaptable, Pleasant Orientation:  Oriented to Self, Oriented to Place, Oriented to  Time, Oriented to Situation Alcohol / Substance use:  Not Applicable Psych involvement (Current and /or in the community):  No (Comment)  Discharge Needs  Concerns to be addressed:  Discharge Planning Concerns Readmission within the last 30 days:  No Current discharge risk:  Dependent with Mobility Barriers to Discharge:  Continued Medical Work up   [employer], Veronia Beets, LCSW 12/08/2015, 4:32 PM

## 2015-12-08 NOTE — Progress Notes (Signed)
Noted patient to have dry adult brief upon last rounding. Bladder scanned patient to reveal 722mls. Paged prime doc to advise

## 2015-12-08 NOTE — Progress Notes (Signed)
I&O patient and got out 600 ml of fluids

## 2015-12-08 NOTE — ED Notes (Signed)
Attempted to Call Report to floor.

## 2015-12-08 NOTE — NC FL2 (Signed)
[place] LEVEL OF CARE SCREENING TOOL     IDENTIFICATION  Patient Name: Matthew [name] Birthdate: 1924-05-07 Sex: male Admission Date (Current Location): 12/07/2015  [place] and [place] Number:  Engineering geologist and Address:  [facility], [address] [place], [place] [address]      Provider Number: [id number]  Attending Physician Name and Address:  Lytle Butte, MD  Relative Name and Phone Number:       Current Level of Care: Hospital Recommended Level of Care: [place] Prior Approval Number:    Date Approved/Denied:   PASRR Number:  ( [id number] )  Discharge Plan: SNF    Current Diagnoses: Patient Active Problem List   Diagnosis Date Noted  . Acute renal failure (ARF) ([place]) 12/08/2015  . Hip fracture ([place]) 11/11/2015  . Encounter for Medicare annual wellness exam 07/09/2013  . Fatigue 07/09/2013  . Hx laparoscopic cholecystectomy 05/16/2013  . Prostate cancer screening 06/04/2011  . CONSTIPATION, CHRONIC 01/12/2010  . Nonspecific (abnormal) findings on radiological and other examination of body structure 10/07/2007  . ALLERGIC CONJUNCTIVITIS 10/04/2007  . EDEMA 10/04/2007  . HYPERCHOLESTEROLEMIA 08/10/2006  . RESTLESS LEG SYNDROME 08/10/2006  . Essential hypertension 08/10/2006  . ALLERGIC RHINITIS 08/10/2006  . Renal insufficiency 08/10/2006  . SKIN CANCER, HX OF 08/10/2006    Orientation RESPIRATION BLADDER Height & Weight     Self, Time, Situation, Place  O2 (2 Liters Oxygen ) Incontinent Weight: 145 lb (65.8 kg) Height:  5\' 8"  (172.7 cm)  BEHAVIORAL SYMPTOMS/MOOD NEUROLOGICAL BOWEL NUTRITION STATUS   (none )  (none ) Incontinent Diet (Diet: Heart Healthy )  AMBULATORY STATUS COMMUNICATION OF NEEDS Skin   Extensive Assist Verbally Surgical wounds                       Personal Care Assistance Level of Assistance  Bathing, Feeding, Dressing Bathing Assistance: Limited  assistance Feeding assistance: Independent Dressing Assistance: Limited assistance     Functional Limitations Info  Sight, Hearing, Speech Sight Info: Adequate Hearing Info: Adequate Speech Info: Adequate    SPECIAL CARE FACTORS FREQUENCY  PT (By licensed PT), OT (By licensed OT)     PT Frequency:  (5) OT Frequency:  (5)            Contractures      Additional Factors Info  Code Status, Allergies Code Status Info:  (Full Code. ) Allergies Info:  (Chlorthalidone, Codeine, Iodine, Penicillins, Ramipril, Ropinirole, Hydrochloride)           Current Medications (12/08/2015):  This is the current hospital active medication list Current Facility-Administered Medications  Medication Dose Route Frequency Provider Last Rate Last Dose  . 0.9 %  sodium chloride infusion   Intravenous Continuous Alexis Hugelmeyer, DO 75 mL/hr at 12/08/15 0400    . acetaminophen (TYLENOL) tablet 650 mg  650 mg Oral Q4H PRN Alexis Hugelmeyer, DO   650 mg at 12/08/15 0726  . amLODipine (NORVASC) tablet 5 mg  5 mg Oral Daily Alexis Hugelmeyer, DO   5 mg at 12/08/15 1028  . aspirin EC tablet 81 mg  81 mg Oral Daily Alexis Hugelmeyer, DO   81 mg at 12/08/15 1028  . atorvastatin (LIPITOR) tablet 20 mg  20 mg Oral QHS Alexis Hugelmeyer, DO      . docusate sodium (COLACE) capsule 100 mg  100 mg Oral BID Alexis Hugelmeyer, DO   100 mg at 12/08/15 1028  . feeding  supplement (ENSURE ENLIVE) (ENSURE ENLIVE) liquid 237 mL  237 mL Oral BID BM Lytle Butte, MD      . feeding supplement (PRO-STAT SUGAR FREE 64) liquid 30 mL  30 mL Oral Daily Lytle Butte, MD      . heparin injection 5,000 Units  5,000 Units Subcutaneous Q12H Alexis Hugelmeyer, DO   5,000 Units at 12/08/15 1028  . HYDROcodone-acetaminophen (NORCO/VICODIN) 5-325 MG per tablet 1-2 tablet  1-2 tablet Oral Q4H PRN Alexis Hugelmeyer, DO   1 tablet at 12/08/15 1247  . mirtazapine (REMERON) tablet 15 mg  15 mg Oral QHS Lytle Butte, MD      . ondansetron  [facility]) injection 4 mg  4 mg Intravenous Q4H PRN Alexis Hugelmeyer, DO      . pantoprazole (PROTONIX) EC tablet 40 mg  40 mg Oral Daily Alexis Hugelmeyer, DO   40 mg at 12/08/15 1028  . tamsulosin (FLOMAX) capsule 0.4 mg  0.4 mg Oral Daily Harmeet Singh, MD   0.4 mg at 12/08/15 1247     Discharge Medications: Please see discharge summary for a list of discharge medications.  Relevant Imaging Results:  Relevant Lab Results:   Additional Information  (SSN: [id number])  Jeremia Groot, Veronia Beets, LCSW

## 2015-12-09 LAB — BASIC METABOLIC PANEL
ANION GAP: 13 (ref 5–15)
BUN: 71 mg/dL — ABNORMAL HIGH (ref 6–20)
CALCIUM: 8.4 mg/dL — AB (ref 8.9–10.3)
CO2: 25 mmol/L (ref 22–32)
Chloride: 103 mmol/L (ref 101–111)
Creatinine, Ser: 2.93 mg/dL — ABNORMAL HIGH (ref 0.61–1.24)
GFR calc Af Amer: 21 mL/min — ABNORMAL LOW (ref >60)
GFR, EST NON AFRICAN AMERICAN: 18 mL/min — AB (ref >60)
Glucose, Bld: 78 mg/dL (ref 65–99)
POTASSIUM: 3 mmol/L — AB (ref 3.5–5.1)
SODIUM: 141 mmol/L (ref 135–145)

## 2015-12-09 LAB — CBC
HCT: 28.5 % — ABNORMAL LOW (ref 40.0–52.0)
HEMOGLOBIN: 9.6 g/dL — AB (ref 13.0–18.0)
MCH: 29.9 pg (ref 26.0–34.0)
MCHC: 33.7 g/dL (ref 32.0–36.0)
MCV: 88.9 fL (ref 80.0–100.0)
Platelets: 255 10*3/uL (ref 150–440)
RBC: 3.21 MIL/uL — ABNORMAL LOW (ref 4.40–5.90)
RDW: 14.1 % (ref 11.5–14.5)
WBC: 15.2 10*3/uL — ABNORMAL HIGH (ref 3.8–10.6)

## 2015-12-09 MED ORDER — BISACODYL 10 MG RE SUPP
10.0000 mg | Freq: Every day | RECTAL | Status: DC
  Administered 2015-12-09: 10 mg via RECTAL
  Filled 2015-12-09: qty 1

## 2015-12-09 MED ORDER — POTASSIUM CHLORIDE CRYS ER 20 MEQ PO TBCR
40.0000 meq | EXTENDED_RELEASE_TABLET | Freq: Once | ORAL | Status: AC
  Administered 2015-12-09: 40 meq via ORAL
  Filled 2015-12-09: qty 2

## 2015-12-09 MED ORDER — HYDROCODONE-ACETAMINOPHEN 5-325 MG PO TABS: 1.0000 | ORAL_TABLET | Freq: Four times a day (QID) | ORAL | Status: DC | PRN

## 2015-12-09 NOTE — Progress Notes (Signed)
Patient is A&O x4. Dressing to left hip intact. Foley in place for retention. Tele dc'd today. IV fluids started, see NN. lncont of stool. Able to feed self, only eating small portions. Encouraged patient and gave supplements as ordered. Weaned off O2.

## 2015-12-09 NOTE — Progress Notes (Signed)
Patient was bladder scanned. Bladder scan  showed  759 ml of urine. Called Dr to get order for foley. Foley inserted and got 700 ml of urine back. Patient stated he felt a lot better, will continue to monitor.

## 2015-12-09 NOTE — Progress Notes (Signed)
MD gave order to discontinue telemetry

## 2015-12-09 NOTE — Progress Notes (Signed)
Subjective:  Patient is doing fair today Urine output 1850 cc Serum creatinine improved to 2.9 Patient continued to have urinary retention and high residuals on bladder scan, therefore Foley catheter was placed   Objective:  Vital signs in last 24 hours:  Temp:  [98.1 F (36.7 C)-98.4 F (36.9 C)] 98.2 F (36.8 C) (08/10 1143) Pulse Rate:  [70-86] 72 (08/10 1143) Resp:  [16-20] 16 (08/10 1143) BP: (99-127)/(49-56) 102/54 (08/10 1143) SpO2:  [94 %-100 %] 96 % (08/10 1143)  Weight change:  Filed Weights   12/07/15 1557  Weight: 65.8 kg (145 lb)    Intake/Output:    Intake/Output Summary (Last 24 hours) at 12/09/15 1229 Last data filed at 12/09/15 1210  Gross per 24 hour  Intake                0 ml  Output             2250 ml  Net            -2250 ml     Physical Exam: General: No acute distress, laying in the bed  HEENT Anicteric, moist oral mucous membranes  Neck supple  Pulm/lungs Normal breathing effort, clear to auscultation  CVS/Heart Irregular, no rub or gallop  Abdomen:  Soft, nontender, nondistended  Extremities: no edema  Neurologic: Able to follow commands  Skin: No acute rashes   Foley catheter present       Basic Metabolic Panel:   Recent Labs Lab 12/07/15 1612 12/08/15 0721 12/09/15 0407  NA 139 138 141  K 3.8 3.8 3.0*  CL 97* 100* 103  CO2 28 26 25   GLUCOSE 103* 89 78  BUN 72* 73* 71*  CREATININE 3.76* 3.50* 2.93*  CALCIUM 9.0 8.1* 8.4*  MG  --  2.0  --      CBC:  Recent Labs Lab 12/07/15 1612 12/09/15 0407  WBC 20.9* 15.2*  HGB 11.0* 9.6*  HCT 33.3* 28.5*  MCV 88.7 88.9  PLT 366 255      Microbiology:  Recent Results (from the past 720 hour(s))  Surgical PCR screen     Status: None   Collection Time: 11/11/15  1:59 PM  Result Value Ref Range Status   MRSA, PCR NEGATIVE NEGATIVE Final   Staphylococcus aureus NEGATIVE NEGATIVE Final    Comment:        The Xpert SA Assay (FDA approved for NASAL specimens in  patients over 43 years of age), is one component of a comprehensive surveillance program.  Test performance has been validated by [facility] for patients greater than or equal to 60 year old. It is not intended to diagnose infection nor to guide or monitor treatment.   CULTURE, BLOOD (ROUTINE X 2) w Reflex to ID Panel     Status: None (Preliminary result)   Collection Time: 12/08/15  2:34 AM  Result Value Ref Range Status   Specimen Description BLOOD LEFT HAND  Final   Special Requests BOTTLES DRAWN AEROBIC AND ANAEROBIC 3ML  Final   Culture NO GROWTH 1 DAY  Final   Report Status PENDING  Incomplete  CULTURE, BLOOD (ROUTINE X 2) w Reflex to ID Panel     Status: None (Preliminary result)   Collection Time: 12/08/15  2:34 AM  Result Value Ref Range Status   Specimen Description BLOOD LEFT AC  Final   Special Requests BOTTLES DRAWN AEROBIC AND ANAEROBIC 9ML  Final   Culture NO GROWTH 1 DAY  Final  Report Status PENDING  Incomplete  MRSA PCR Screening     Status: None   Collection Time: 12/08/15  3:35 AM  Result Value Ref Range Status   MRSA by PCR NEGATIVE NEGATIVE Final    Comment:        The GeneXpert MRSA Assay (FDA approved for NASAL specimens only), is one component of a comprehensive MRSA colonization surveillance program. It is not intended to diagnose MRSA infection nor to guide or monitor treatment for MRSA infections.     Coagulation Studies: No results for input(s): LABPROT, INR in the last 72 hours.  Urinalysis:  Recent Labs  12/07/15 1700  COLORURINE YELLOW*  LABSPEC 1.011  PHURINE 5.0  GLUCOSEU NEGATIVE  HGBUR NEGATIVE  BILIRUBINUR NEGATIVE  KETONESUR NEGATIVE  PROTEINUR NEGATIVE  NITRITE NEGATIVE  LEUKOCYTESUR NEGATIVE      Imaging: Ct Abdomen Pelvis Wo Contrast  Result Date: 12/07/2015 CLINICAL DATA:  82 year old with anorexia and bowel distention on radiograph. EXAM: CT ABDOMEN AND PELVIS WITHOUT CONTRAST TECHNIQUE: Multidetector CT  imaging of the abdomen and pelvis was performed following the standard protocol without IV contrast. COMPARISON:  Radiographs earlier this day.  CT 04/23/2013 FINDINGS: Lower chest: Right basilar atelectasis. Scattered hyperdensities within the atelectatic lung. Coronary artery calcifications with upper normal heart size. Scattered calcified pleural plaques suspected. Liver: Calcified granulomas in the right lobe. Hepatobiliary: Clips in the gallbladder fossa postcholecystectomy. No biliary dilatation. Pancreas: No ductal dilatation or inflammation. Spleen: Normal. Adrenal glands: No nodule.  Thickening on the left unchanged. Kidneys: Mild bilateral hydroureteronephrosis. No obstructing stone. This may be secondary to bladder distention. There is a 1.4 cm cyst in the upper right kidney. Small hyperdense focus in the mid right renal cortex likely a hyperdense cyst. Stomach/Bowel: There is enteric contrast in the distal esophagus. No distal esophageal wall thickening. Stomach physiologically distended with enteric contrast. There are no dilated or thickened small bowel loops. Large stool burden with significant colonic tortuosity consistent with constipation. There is a stool ball distending the rectum, 9.4 cm transverse with mild distal rectal wall thickening. There is otherwise no colonic wall thickening. Cecum is high-riding in the right upper quadrant, unchanged. Enteric contrast just reaches the cecum. Vascular/Lymphatic: No retroperitoneal adenopathy. Abdominal aorta is normal in caliber. Moderate atherosclerosis of the abdominal aorta and its branches without aneurysm. Reproductive: Prominent sized prostate gland spanning 6.5 cm transverse. Soft tissue density in the right inguinal canal is nonspecific, suspect varicoceles. Bladder: Distended to the level of the umbilicus. No bladder wall thickening. Other: No free air, free fluid, or intra-abdominal fluid collection. Musculoskeletal: There are no acute or  suspicious osseous abnormalities. Left hip arthroplasty. IMPRESSION: 1. Large stool burden with colonic redundancy consistent with constipation. Stool ball distending the rectum with wall thickening suggesting fecal impaction. No small bowel obstruction. 2. Mild bilateral hydronephrosis is likely secondary to urinary bladder distention. Prostate gland is enlarged, question chronic bladder outlet obstruction. 3. Enteric contrast in the distal esophagus, reflux versus slow transit. 4. Atherosclerosis including coronary artery calcifications. 5. Additional chronic findings as described. Electronically Signed   By: Jeb Levering M.D.   On: 12/07/2015 20:30   Dg Chest 1 View  Result Date: 12/08/2015 CLINICAL DATA:  Initial evaluation for acute fever.  Anorexia. EXAM: CHEST 1 VIEW COMPARISON:  Prior radiograph from 12/07/2015. FINDINGS: Cardiac and mediastinal silhouettes are stable in size and contour, and remain within normal limits. Atheromatous plaque within the intrathoracic aorta. Lungs are hypoinflated. Mild bibasilar subsegmental atelectasis. No consolidative airspace disease.  No pulmonary edema or pleural effusion. No pneumothorax. Prominent gaseous distention of several loops of large and possibly small bowel seen within the upper abdomen, similar to previous. No acute osseous abnormality. Osteopenia noted. Multiple remote left-sided rib fractures noted, stable. IMPRESSION: 1. Shallow lung inflation with mild bibasilar subsegmental atelectasis. No other active cardiopulmonary disease. 2. Prominent gaseous distention of multiple loops of bowel (predominantly colon) within the visualized upper abdomen, similar to prior. 3. Aortic atherosclerosis. Electronically Signed   By: Jeannine Boga M.D.   On: 12/08/2015 02:35   Dg Abdomen Acute W/chest  Result Date: 12/07/2015 CLINICAL DATA:  Anorexia. EXAM: DG ABDOMEN ACUTE W/ 1V CHEST COMPARISON:  04/23/2013. FINDINGS: The lungs are clear wiithout focal  pneumonia, edema, pneumothorax or pleural effusion. Interstitial markings are diffusely coarsened with chronic features. The cardiopericardial silhouette is within normal limits for size. Marked gaseous distension of large and small bowel is identified in the abdomen with prominent stool volume noted in the rectum. Bones are diffusely demineralized. Patient is status post left hip replacement. IMPRESSION: 1. No acute cardiopulmonary findings. 2. Marked gaseous dilatation of large and small bowel, but predominantly colon. Imaging features could be compatible with severe colonic dysmotility although large stool volume in the rectum raises concern for fecal impaction. Electronically Signed   By: Misty Stanley M.D.   On: 12/07/2015 16:38     Medications:   . sodium chloride 85 mL/hr at 12/09/15 0901   . amLODipine  5 mg Oral Daily  . aspirin EC  81 mg Oral Daily  . atorvastatin  20 mg Oral QHS  . bisacodyl  10 mg Rectal Daily  . docusate sodium  100 mg Oral BID  . feeding supplement (ENSURE ENLIVE)  237 mL Oral BID BM  . feeding supplement (PRO-STAT SUGAR FREE 64)  30 mL Oral Daily  . heparin subcutaneous  5,000 Units Subcutaneous Q12H  . mirtazapine  15 mg Oral QHS  . pantoprazole  40 mg Oral Daily  . tamsulosin  0.4 mg Oral Daily   acetaminophen, HYDROcodone-acetaminophen, ondansetron  Assessment/ Plan:  82 y.o. male with  medical problems of chronic constipation,left hip hemiarthroplasty November 11, 2015,[facility] nursing home resident, who was admitted to [facility] on 12/07/2015 for evaluation of anorexia.   1.  Acute renal failure, bladder outlet obstruction CT of the abdomen showed bladder distention, mild hydronephrosis, and enlarged prostate gland Foley catheter replaced for urinary retention Serum creatinine has improved today to 2.93 Urine output 1850 cc  2.  BPH, continue Flomax  3.  Chronic constipation with fecal impaction Fecal disimpaction done in the emergency room    LOS: 1 Matthew [name] 8/10/201712:29 PM

## 2015-12-09 NOTE — Progress Notes (Signed)
BP 99/49, HR 70. Notified MD. New orders to hold BP meds this am, give 250ccNS bolus and start NS at 85cc/hr after bolus completed.

## 2015-12-09 NOTE — Progress Notes (Signed)
Matthew [name] NAME: Matthew [name]    MRN#:  [id number]  DATE OF BIRTH:  Dec 28, 1924  SUBJECTIVE:  Hospital Day: day 2 [place] Matthew [name] is a 82 y.o. male presenting with Anorexia Overnight events: foley placed due to urinary retention. Interval Events: Sleeping this morning, easily arousable no complaints states he simply had a poor appetite  REVIEW OF SYSTEMS:  CONSTITUTIONAL: No fever, Positive fatigue or weakness.  EYES: No blurred or double vision.  EARS, NOSE, AND THROAT: No tinnitus or ear pain.  RESPIRATORY: No cough, shortness of breath, wheezing or hemoptysis.  CARDIOVASCULAR: No chest pain, orthopnea, edema.  GASTROINTESTINAL: No nausea, vomiting, diarrhea or abdominal pain.  GENITOURINARY: No dysuria, hematuria.  ENDOCRINE: No polyuria, nocturia,  HEMATOLOGY: No anemia, easy bruising or bleeding SKIN: No rash or lesion. MUSCULOSKELETAL: No joint pain or arthritis.   NEUROLOGIC: No tingling, numbness, weakness.  PSYCHIATRY: No anxiety or depression.   DRUG ALLERGIES:   Allergies  Allergen Reactions  . Chlorthalidone     REACTION: frequent urination  . Codeine     REACTION: nausea  . Iodine     REACTION: caused bubble to come up in eye  . Penicillins     REACTION: GI  . Ramipril     REACTION: cough  . Ropinirole Hydrochloride     REACTION: insomnia    VITALS:  Blood pressure (!) 127/56, pulse 86, temperature 98.1 F (36.7 C), temperature source Oral, resp. rate 19, height 5\' 8"  (1.727 m), weight 65.8 kg (145 lb), SpO2 100 %.  PHYSICAL EXAMINATION:  VITAL SIGNS: Vitals:   12/08/15 2349 12/09/15 0316  BP: (!) 122/55 (!) 127/56  Pulse: 77 86  Resp: 20 19  Temp: 98.4 F (36.9 C) 98.1 F (36.7 C)   GENERAL:82 y.o.male currently in no acute distress. Frail appearing HEAD: Normocephalic, atraumatic.  EYES: Pupils equal, round, reactive to light. Extraocular muscles intact. No scleral icterus.   MOUTH: Moist mucosal membrane. Dentition intact. No abscess noted.  EAR, NOSE, THROAT: Clear without exudates. No external lesions.  NECK: Supple. No thyromegaly. No nodules. No JVD.  PULMONARY: Clear to ascultation, without wheeze rails or rhonci. No use of accessory muscles, Good respiratory effort. good air entry bilaterally CHEST: Nontender to palpation.  CARDIOVASCULAR: S1 and S2. Regular rate and rhythm. No murmurs, rubs, or gallops. No edema. Pedal pulses 2+ bilaterally.  GASTROINTESTINAL: Soft, nontender, nondistended. No masses. Positive bowel sounds. No hepatosplenomegaly.  MUSCULOSKELETAL: No swelling, clubbing, or edema. Range of motion full in all extremities.  NEUROLOGIC: Cranial nerves II through XII are intact. No gross focal neurological deficits. Sensation intact. Reflexes intact.  SKIN: No ulceration, lesions, rashes, or cyanosis. Skin warm and dry. Turgor intact.  PSYCHIATRIC: Mood, affect within normal limits. The patient is awake, alert and oriented x 3. Insight, judgment intact.      LABORATORY PANEL:   CBC  Recent Labs Lab 12/09/15 0407  WBC 15.2*  HGB 9.6*  HCT 28.5*  PLT 255   ------------------------------------------------------------------------------------------------------------------  Chemistries   Recent Labs Lab 12/07/15 1612 12/08/15 0721 12/09/15 0407  NA 139 138 141  K 3.8 3.8 3.0*  CL 97* 100* 103  CO2 28 26 25   GLUCOSE 103* 89 78  BUN 72* 73* 71*  CREATININE 3.76* 3.50* 2.93*  CALCIUM 9.0 8.1* 8.4*  MG  --  2.0  --   AST 19  --   --   ALT 17  --   --  ALKPHOS 91  --   --   BILITOT 1.2  --   --    ------------------------------------------------------------------------------------------------------------------  Cardiac Enzymes  Recent Labs Lab 12/08/15 0300  TROPONINI 0.05*   ------------------------------------------------------------------------------------------------------------------  RADIOLOGY:  Ct Abdomen  Pelvis Wo Contrast  Result Date: 12/07/2015 CLINICAL DATA:  82 year old with anorexia and bowel distention on radiograph. EXAM: CT ABDOMEN AND PELVIS WITHOUT CONTRAST TECHNIQUE: Multidetector CT imaging of the abdomen and pelvis was performed following the standard protocol without IV contrast. COMPARISON:  Radiographs earlier this day.  CT 04/23/2013 FINDINGS: Lower chest: Right basilar atelectasis. Scattered hyperdensities within the atelectatic lung. Coronary artery calcifications with upper normal heart size. Scattered calcified pleural plaques suspected. Liver: Calcified granulomas in the right lobe. Hepatobiliary: Clips in the gallbladder fossa postcholecystectomy. No biliary dilatation. Pancreas: No ductal dilatation or inflammation. Spleen: Normal. Adrenal glands: No nodule.  Thickening on the left unchanged. Kidneys: Mild bilateral hydroureteronephrosis. No obstructing stone. This may be secondary to bladder distention. There is a 1.4 cm cyst in the upper right kidney. Small hyperdense focus in the mid right renal cortex likely a hyperdense cyst. Stomach/Bowel: There is enteric contrast in the distal esophagus. No distal esophageal wall thickening. Stomach physiologically distended with enteric contrast. There are no dilated or thickened small bowel loops. Large stool burden with significant colonic tortuosity consistent with constipation. There is a stool ball distending the rectum, 9.4 cm transverse with mild distal rectal wall thickening. There is otherwise no colonic wall thickening. Cecum is high-riding in the right upper quadrant, unchanged. Enteric contrast just reaches the cecum. Vascular/Lymphatic: No retroperitoneal adenopathy. Abdominal aorta is normal in caliber. Moderate atherosclerosis of the abdominal aorta and its branches without aneurysm. Reproductive: Prominent sized prostate gland spanning 6.5 cm transverse. Soft tissue density in the right inguinal canal is nonspecific, suspect  varicoceles. Bladder: Distended to the level of the umbilicus. No bladder wall thickening. Other: No free air, free fluid, or intra-abdominal fluid collection. Musculoskeletal: There are no acute or suspicious osseous abnormalities. Left hip arthroplasty. IMPRESSION: 1. Large stool burden with colonic redundancy consistent with constipation. Stool ball distending the rectum with wall thickening suggesting fecal impaction. No small bowel obstruction. 2. Mild bilateral hydronephrosis is likely secondary to urinary bladder distention. Prostate gland is enlarged, question chronic bladder outlet obstruction. 3. Enteric contrast in the distal esophagus, reflux versus slow transit. 4. Atherosclerosis including coronary artery calcifications. 5. Additional chronic findings as described. Electronically Signed   By: Jeb Levering M.D.   On: 12/07/2015 20:30   Dg Chest 1 View  Result Date: 12/08/2015 CLINICAL DATA:  Initial evaluation for acute fever.  Anorexia. EXAM: CHEST 1 VIEW COMPARISON:  Prior radiograph from 12/07/2015. FINDINGS: Cardiac and mediastinal silhouettes are stable in size and contour, and remain within normal limits. Atheromatous plaque within the intrathoracic aorta. Lungs are hypoinflated. Mild bibasilar subsegmental atelectasis. No consolidative airspace disease. No pulmonary edema or pleural effusion. No pneumothorax. Prominent gaseous distention of several loops of large and possibly small bowel seen within the upper abdomen, similar to previous. No acute osseous abnormality. Osteopenia noted. Multiple remote left-sided rib fractures noted, stable. IMPRESSION: 1. Shallow lung inflation with mild bibasilar subsegmental atelectasis. No other active cardiopulmonary disease. 2. Prominent gaseous distention of multiple loops of bowel (predominantly colon) within the visualized upper abdomen, similar to prior. 3. Aortic atherosclerosis. Electronically Signed   By: Jeannine Boga M.D.   On:  12/08/2015 02:35   Dg Abdomen Acute W/chest  Result Date: 12/07/2015 CLINICAL DATA:  Anorexia. EXAM:  DG ABDOMEN ACUTE W/ 1V CHEST COMPARISON:  04/23/2013. FINDINGS: The lungs are clear wiithout focal pneumonia, edema, pneumothorax or pleural effusion. Interstitial markings are diffusely coarsened with chronic features. The cardiopericardial silhouette is within normal limits for size. Marked gaseous distension of large and small bowel is identified in the abdomen with prominent stool volume noted in the rectum. Bones are diffusely demineralized. Patient is status post left hip replacement. IMPRESSION: 1. No acute cardiopulmonary findings. 2. Marked gaseous dilatation of large and small bowel, but predominantly colon. Imaging features could be compatible with severe colonic dysmotility although large stool volume in the rectum raises concern for fecal impaction. Electronically Signed   By: Misty Stanley M.D.   On: 12/07/2015 16:38    EKG:   Orders placed or performed during the hospital encounter of 11/11/15  . ED EKG  . ED EKG  . EKG 12-Lead  . EKG 12-Lead  . EKG    ASSESSMENT AND PLAN:   Imogene Kamath is a 82 y.o. male presenting with Anorexia . Admitted 12/07/2015 : Day #: 1 day 1. Acute renal failure prerenal versus ATN:  -IV fluid hydration  -consult with nephrology appreciated -creatinine trending down 3.76---3.50---2.93 -very poor po intake  2. Failure to thrive/poor appetite: Dietary consult, add Remeron -not eating much  3. GERD without esophagitis: PPI therapy  4.relatvie hypotension in pt with Essential hypertension: hold bp meds Bb and Norvasc  5.Hyperlipidemia unspecified Lipitor  6.urinanry retention with enlarged prostate seen on CT abdomen -started on flomax -foley placed.  -pt will need to see urology as out pt. Will plan on d/cing with foley in for now to SNF   All the records are reviewed and case discussed with Care Management/Social Workerr. Management  plans discussed with the patient, family and they are in agreement.  CODE STATUS: full TOTAL TIME TAKING CARE OF THIS PATIENT: 33 minutes.   POSSIBLE D/C IN 2-3DAYS, DEPENDING ON CLINICAL CONDITION.   Lovene Maret M.D on 12/09/2015 at 8:47 AM  Between 7am to 6pm - Pager - [phone]  After 6pm: House Pager: - [phone]  [facility]  Office  [phone]  CC: Primary care physician; Loura Pardon, MD

## 2015-12-09 NOTE — Progress Notes (Signed)
Per MD patient will likely be ready for D/C tomorrow. Plan is for patient to return to [name]. Doug admissions coordinator at [name] is aware of above. Clinical Social Worker (CSW) will continue to follow and assist as needed.   [employer], LCSW [phone]

## 2015-12-10 DIAGNOSIS — E876 Hypokalemia: Secondary | ICD-10-CM | POA: Diagnosis present

## 2015-12-10 DIAGNOSIS — B952 Enterococcus as the cause of diseases classified elsewhere: Secondary | ICD-10-CM | POA: Diagnosis present

## 2015-12-10 DIAGNOSIS — N401 Enlarged prostate with lower urinary tract symptoms: Secondary | ICD-10-CM | POA: Diagnosis not present

## 2015-12-10 DIAGNOSIS — Z8249 Family history of ischemic heart disease and other diseases of the circulatory system: Secondary | ICD-10-CM | POA: Diagnosis not present

## 2015-12-10 DIAGNOSIS — T83511A Infection and inflammatory reaction due to indwelling urethral catheter, initial encounter: Secondary | ICD-10-CM | POA: Diagnosis not present

## 2015-12-10 DIAGNOSIS — R6889 Other general symptoms and signs: Secondary | ICD-10-CM | POA: Diagnosis not present

## 2015-12-10 DIAGNOSIS — K219 Gastro-esophageal reflux disease without esophagitis: Secondary | ICD-10-CM | POA: Diagnosis not present

## 2015-12-10 DIAGNOSIS — K5909 Other constipation: Secondary | ICD-10-CM | POA: Diagnosis not present

## 2015-12-10 DIAGNOSIS — Z85840 Personal history of malignant neoplasm of eye: Secondary | ICD-10-CM | POA: Diagnosis not present

## 2015-12-10 DIAGNOSIS — G9341 Metabolic encephalopathy: Secondary | ICD-10-CM | POA: Diagnosis not present

## 2015-12-10 DIAGNOSIS — R918 Other nonspecific abnormal finding of lung field: Secondary | ICD-10-CM | POA: Diagnosis not present

## 2015-12-10 DIAGNOSIS — N183 Chronic kidney disease, stage 3 (moderate): Secondary | ICD-10-CM | POA: Diagnosis not present

## 2015-12-10 DIAGNOSIS — W19XXXD Unspecified fall, subsequent encounter: Secondary | ICD-10-CM | POA: Diagnosis not present

## 2015-12-10 DIAGNOSIS — N320 Bladder-neck obstruction: Secondary | ICD-10-CM | POA: Diagnosis not present

## 2015-12-10 DIAGNOSIS — Z7982 Long term (current) use of aspirin: Secondary | ICD-10-CM | POA: Diagnosis not present

## 2015-12-10 DIAGNOSIS — Z96642 Presence of left artificial hip joint: Secondary | ICD-10-CM | POA: Diagnosis not present

## 2015-12-10 DIAGNOSIS — R4182 Altered mental status, unspecified: Secondary | ICD-10-CM | POA: Diagnosis not present

## 2015-12-10 DIAGNOSIS — T83091A Other mechanical complication of indwelling urethral catheter, initial encounter: Secondary | ICD-10-CM | POA: Diagnosis not present

## 2015-12-10 DIAGNOSIS — R630 Anorexia: Secondary | ICD-10-CM | POA: Diagnosis not present

## 2015-12-10 DIAGNOSIS — Z85820 Personal history of malignant melanoma of skin: Secondary | ICD-10-CM | POA: Diagnosis not present

## 2015-12-10 DIAGNOSIS — Z888 Allergy status to other drugs, medicaments and biological substances status: Secondary | ICD-10-CM | POA: Diagnosis not present

## 2015-12-10 DIAGNOSIS — Z743 Need for continuous supervision: Secondary | ICD-10-CM | POA: Diagnosis not present

## 2015-12-10 DIAGNOSIS — N179 Acute kidney failure, unspecified: Secondary | ICD-10-CM | POA: Diagnosis not present

## 2015-12-10 DIAGNOSIS — N138 Other obstructive and reflux uropathy: Secondary | ICD-10-CM | POA: Diagnosis not present

## 2015-12-10 DIAGNOSIS — Z87891 Personal history of nicotine dependence: Secondary | ICD-10-CM | POA: Diagnosis not present

## 2015-12-10 DIAGNOSIS — D649 Anemia, unspecified: Secondary | ICD-10-CM | POA: Diagnosis present

## 2015-12-10 DIAGNOSIS — Z823 Family history of stroke: Secondary | ICD-10-CM | POA: Diagnosis not present

## 2015-12-10 DIAGNOSIS — R627 Adult failure to thrive: Secondary | ICD-10-CM | POA: Diagnosis not present

## 2015-12-10 DIAGNOSIS — I10 Essential (primary) hypertension: Secondary | ICD-10-CM | POA: Diagnosis not present

## 2015-12-10 DIAGNOSIS — E860 Dehydration: Secondary | ICD-10-CM | POA: Diagnosis not present

## 2015-12-10 DIAGNOSIS — Z9981 Dependence on supplemental oxygen: Secondary | ICD-10-CM | POA: Diagnosis not present

## 2015-12-10 DIAGNOSIS — R400 Somnolence: Secondary | ICD-10-CM | POA: Diagnosis not present

## 2015-12-10 DIAGNOSIS — Y92122 Bedroom in nursing home as the place of occurrence of the external cause: Secondary | ICD-10-CM | POA: Diagnosis not present

## 2015-12-10 DIAGNOSIS — Z800 Family history of malignant neoplasm of digestive organs: Secondary | ICD-10-CM | POA: Diagnosis not present

## 2015-12-10 DIAGNOSIS — N390 Urinary tract infection, site not specified: Secondary | ICD-10-CM | POA: Diagnosis not present

## 2015-12-10 DIAGNOSIS — Z880 Allergy status to penicillin: Secondary | ICD-10-CM | POA: Diagnosis not present

## 2015-12-10 DIAGNOSIS — I129 Hypertensive chronic kidney disease with stage 1 through stage 4 chronic kidney disease, or unspecified chronic kidney disease: Secondary | ICD-10-CM | POA: Diagnosis not present

## 2015-12-10 DIAGNOSIS — Z885 Allergy status to narcotic agent status: Secondary | ICD-10-CM | POA: Diagnosis not present

## 2015-12-10 DIAGNOSIS — R404 Transient alteration of awareness: Secondary | ICD-10-CM | POA: Diagnosis not present

## 2015-12-10 DIAGNOSIS — S72002D Fracture of unspecified part of neck of left femur, subsequent encounter for closed fracture with routine healing: Secondary | ICD-10-CM | POA: Diagnosis not present

## 2015-12-10 DIAGNOSIS — J309 Allergic rhinitis, unspecified: Secondary | ICD-10-CM | POA: Diagnosis present

## 2015-12-10 LAB — BASIC METABOLIC PANEL
ANION GAP: 8 (ref 5–15)
BUN: 54 mg/dL — ABNORMAL HIGH (ref 6–20)
CALCIUM: 8.1 mg/dL — AB (ref 8.9–10.3)
CO2: 26 mmol/L (ref 22–32)
Chloride: 110 mmol/L (ref 101–111)
Creatinine, Ser: 1.7 mg/dL — ABNORMAL HIGH (ref 0.61–1.24)
GFR, EST AFRICAN AMERICAN: 40 mL/min — AB (ref >60)
GFR, EST NON AFRICAN AMERICAN: 34 mL/min — AB (ref >60)
Glucose, Bld: 91 mg/dL (ref 65–99)
Potassium: 3.2 mmol/L — ABNORMAL LOW (ref 3.5–5.1)
Sodium: 144 mmol/L (ref 135–145)

## 2015-12-10 MED ORDER — MIRTAZAPINE 15 MG PO TABS: 15.0000 mg | ORAL_TABLET | Freq: Every day | ORAL | 0 refills | Status: AC

## 2015-12-10 MED ORDER — TAMSULOSIN HCL 0.4 MG PO CAPS: 0.4000 mg | ORAL_CAPSULE | Freq: Every day | ORAL | 1 refills | Status: AC

## 2015-12-10 MED ORDER — DOCUSATE SODIUM 100 MG PO CAPS: 100.0000 mg | ORAL_CAPSULE | Freq: Two times a day (BID) | ORAL | 0 refills | Status: AC

## 2015-12-10 MED ORDER — ENSURE ENLIVE PO LIQD: 237.0000 mL | Freq: Two times a day (BID) | ORAL | 12 refills | Status: DC

## 2015-12-10 MED ORDER — PRO-STAT SUGAR FREE PO LIQD: 30.0000 mL | Freq: Every day | ORAL | 0 refills | Status: DC

## 2015-12-10 NOTE — Progress Notes (Signed)
Patient is being discharged to [facility] via EMS. Report called,. IV removed with cath intact. Waiting for EMS at this time.

## 2015-12-10 NOTE — Care Management Important Message (Signed)
Important Message  Patient Details  Name: Matthew [name] MRN: [id number] Date of Birth: 14-Jun-1924   Medicare Important Message Given:  Yes    Alvie Heidelberg, RN 12/10/2015, 9:56 AM

## 2015-12-10 NOTE — Progress Notes (Signed)
Patient is medically stable for D/C back to [name]. Per [facility] admissions coordinator at [facility] patient will go to room 505. Per [name] he will accept a 5 day LOG. CSW Surveyor, quantity approved 5 day LOG. RN will call report and arrange EMS for transport. CSW sent D/C orders to [facility] via [facility]. CSW explained to patient and wife that if [name] does not approve SNF then patient will have to D/C home after 5 days. CSW spoke with patient's wife [name] over the phone. Please reconsult if future social work needs arise. CSW signing off.   [employer], LCSW [phone]

## 2015-12-10 NOTE — Progress Notes (Signed)
Subjective:  Patient is doing fair today Urine output 1850 cc Serum creatinine improved to 2.9 Patient continued to have urinary retention and high residuals on bladder scan, therefore Foley catheter was placed   Objective:  Vital signs in last 24 hours:  Temp:  [98 F (36.7 C)-98.4 F (36.9 C)] 98 F (36.7 C) (08/11 0900) Pulse Rate:  [72-80] 78 (08/11 0900) Resp:  [16-19] 18 (08/11 0900) BP: (102-138)/(45-74) 138/69 (08/11 0900) SpO2:  [95 %-97 %] 97 % (08/11 0900)  Weight change:  Filed Weights   12/07/15 1557  Weight: 65.8 kg (145 lb)    Intake/Output:    Intake/Output Summary (Last 24 hours) at 12/10/15 1019 Last data filed at 12/10/15 [id number]  Gross per 24 hour  Intake          2453.08 ml  Output             1600 ml  Net           853.08 ml     Physical Exam: General: No acute distress, laying in the bed  HEENT Anicteric, moist oral mucous membranes  Neck supple  Pulm/lungs Normal breathing effort, clear to auscultation  CVS/Heart Irregular, no rub or gallop  Abdomen:  Soft, nontender, nondistended  Extremities: no edema  Neurologic: Able to follow commands  Skin: No acute rashes   Foley catheter present       Basic Metabolic Panel:   Recent Labs Lab 12/07/15 1612 12/08/15 0721 12/09/15 0407 12/10/15 0443  NA 139 138 141 144  K 3.8 3.8 3.0* 3.2*  CL 97* 100* 103 110  CO2 28 26 25 26   GLUCOSE 103* 89 78 91  BUN 72* 73* 71* 54*  CREATININE 3.76* 3.50* 2.93* 1.70*  CALCIUM 9.0 8.1* 8.4* 8.1*  MG  --  2.0  --   --      CBC:  Recent Labs Lab 12/07/15 1612 12/09/15 0407  WBC 20.9* 15.2*  HGB 11.0* 9.6*  HCT 33.3* 28.5*  MCV 88.7 88.9  PLT 366 255      Microbiology:  Recent Results (from the past 720 hour(s))  Surgical PCR screen     Status: None   Collection Time: 11/11/15  1:59 PM  Result Value Ref Range Status   MRSA, PCR NEGATIVE NEGATIVE Final   Staphylococcus aureus NEGATIVE NEGATIVE Final    Comment:        The Xpert  SA Assay (FDA approved for NASAL specimens in patients over 76 years of age), is one component of a comprehensive surveillance program.  Test performance has been validated by [facility] for patients greater than or equal to 34 year old. It is not intended to diagnose infection nor to guide or monitor treatment.   CULTURE, BLOOD (ROUTINE X 2) w Reflex to ID Panel     Status: None (Preliminary result)   Collection Time: 12/08/15  2:34 AM  Result Value Ref Range Status   Specimen Description BLOOD LEFT HAND  Final   Special Requests BOTTLES DRAWN AEROBIC AND ANAEROBIC 3ML  Final   Culture NO GROWTH 2 DAYS  Final   Report Status PENDING  Incomplete  CULTURE, BLOOD (ROUTINE X 2) w Reflex to ID Panel     Status: None (Preliminary result)   Collection Time: 12/08/15  2:34 AM  Result Value Ref Range Status   Specimen Description BLOOD LEFT AC  Final   Special Requests BOTTLES DRAWN AEROBIC AND ANAEROBIC 9ML  Final   Culture NO  GROWTH 2 DAYS  Final   Report Status PENDING  Incomplete  MRSA PCR Screening     Status: None   Collection Time: 12/08/15  3:35 AM  Result Value Ref Range Status   MRSA by PCR NEGATIVE NEGATIVE Final    Comment:        The GeneXpert MRSA Assay (FDA approved for NASAL specimens only), is one component of a comprehensive MRSA colonization surveillance program. It is not intended to diagnose MRSA infection nor to guide or monitor treatment for MRSA infections.     Coagulation Studies: No results for input(s): LABPROT, INR in the last 72 hours.  Urinalysis:  Recent Labs  12/07/15 1700  COLORURINE YELLOW*  LABSPEC 1.011  PHURINE 5.0  GLUCOSEU NEGATIVE  HGBUR NEGATIVE  BILIRUBINUR NEGATIVE  KETONESUR NEGATIVE  PROTEINUR NEGATIVE  NITRITE NEGATIVE  LEUKOCYTESUR NEGATIVE      Imaging: No results found.   Medications:     . amLODipine  5 mg Oral Daily  . aspirin EC  81 mg Oral Daily  . atorvastatin  20 mg Oral QHS  . bisacodyl  10 mg  Rectal Daily  . docusate sodium  100 mg Oral BID  . feeding supplement (ENSURE ENLIVE)  237 mL Oral BID BM  . feeding supplement (PRO-STAT SUGAR FREE 64)  30 mL Oral Daily  . heparin subcutaneous  5,000 Units Subcutaneous Q12H  . mirtazapine  15 mg Oral QHS  . pantoprazole  40 mg Oral Daily  . tamsulosin  0.4 mg Oral Daily   acetaminophen, HYDROcodone-acetaminophen, ondansetron  Assessment/ Plan:  82 y.o. male with  medical problems of chronic constipation,left hip hemiarthroplasty November 11, 2015,[facility] nursing home resident, who was admitted to [facility] on 12/07/2015 for evaluation of anorexia.   1.  Acute renal failure, bladder outlet obstruction CT of the abdomen showed bladder distention, mild hydronephrosis, and enlarged prostate gland Foley catheter replaced for urinary retention Serum creatinine has improved today to 1.70 Urine output 1600 cc  2.  BPH, continue Flomax  3.  Chronic constipation with fecal impaction Fecal disimpaction done in the emergency room   LOS: 2 Matthew [name] 8/11/201710:19 AM

## 2015-12-10 NOTE — Discharge Summary (Signed)
[place] [facility] [place] NAME: Matthew [name]    MR#:  [id number]  DATE OF BIRTH:  09-30-1924  DATE OF ADMISSION:  12/07/2015 ADMITTING PHYSICIAN: Max Sane, MD  DATE OF DISCHARGE: 12/10/15  PRIMARY CARE PHYSICIAN: Loura Pardon, MD    ADMISSION DIAGNOSIS:  Fever [R50.9] Acute renal failure, unspecified acute renal failure type ([place]) [N17.9]  DISCHARGE DIAGNOSIS:  Acute on chronic renal due to dehydration and poor po intake HTN Chronic constipation (improving)  SECONDARY DIAGNOSIS:   Past Medical History:  Diagnosis Date  . Allergic rhinitis   . Chronic constipation   . Chronic kidney disease    stage 3, [place] benign appearing cysts 05/2006  . History of CT scan of chest 6/09   calcification consistent with asbestos exp, also pleural nodule, incidental gallstones  . History of transfusion of whole blood   . Hypertension   . Melanoma ([place])    history of, by right eye  . Pulmonary nodules    being obs with serial CT, 6 month f/u stable 10/05  . Renal insufficiency     HOSPITAL COURSE:  Matthew [name] is a 82 y.o. male presenting with Anorexia . Admitted 12/07/2015  1. Acute renal failure prerenal versus ATN:  -received IV fluid for hydration  -consult with nephrology appreciated -creatinine trending down 3.76---3.50---2.93--1.70 -very poor po intake -encourage po fluids  2. Failure to thrive/poor appetite: Dietary consult, added Remeron  3. GERD without esophagitis: PPI therapy  4.relatvie hypotension in pt with Essential hypertension: resume bp meds Bb and Norvasc -d/c lasix  5.Hyperlipidemia unspecified Lipitor  6.urinanry retention with enlarged prostate seen on CT abdomen -started on flomax -foley placed.  -pt will need to see urology (dr Maggie Schwalbe call for unassigned today) as out pt. Will plan on d/cing with foley for now to SNF  D/c to snf today CONSULTS OBTAINED:  Treatment Team:  Murlean Iba,  MD  DRUG ALLERGIES:   Allergies  Allergen Reactions  . Chlorthalidone     REACTION: frequent urination  . Codeine     REACTION: nausea  . Iodine     REACTION: caused bubble to come up in eye  . Penicillins     REACTION: GI  . Ramipril     REACTION: cough  . Ropinirole Hydrochloride     REACTION: insomnia    DISCHARGE MEDICATIONS:   Current Discharge Medication List    START taking these medications   Details  Amino Acids-Protein Hydrolys (FEEDING SUPPLEMENT, PRO-STAT SUGAR FREE 64,) LIQD Take 30 mLs by mouth daily. Qty: 900 mL, Refills: 0    docusate sodium (COLACE) 100 MG capsule Take 1 capsule (100 mg total) by mouth 2 (two) times daily. Qty: 10 capsule, Refills: 0    feeding supplement, ENSURE ENLIVE, (ENSURE ENLIVE) LIQD Take 237 mLs by mouth 2 (two) times daily between meals. Qty: 237 mL, Refills: 12    mirtazapine (REMERON) 15 MG tablet Take 1 tablet (15 mg total) by mouth at bedtime. Qty: 30 tablet, Refills: 0    tamsulosin (FLOMAX) 0.4 MG CAPS capsule Take 1 capsule (0.4 mg total) by mouth daily. Qty: 30 capsule, Refills: 1      CONTINUE these medications which have NOT CHANGED   Details  amLODipine (NORVASC) 10 MG tablet Take 10 mg by mouth daily.    aspirin 81 MG tablet Take 81 mg by mouth daily.      atorvastatin (LIPITOR) 20 MG tablet Take 20 mg by mouth  daily.    pantoprazole (PROTONIX) 40 MG tablet Take 1 tablet (40 mg total) by mouth daily. Qty: 30 tablet, Refills: 0      STOP taking these medications     furosemide (LASIX) 20 MG tablet         If you experience worsening of your admission symptoms, develop shortness of breath, life threatening emergency, suicidal or homicidal thoughts you must seek medical attention immediately by calling 911 or calling your MD immediately  if symptoms less severe.  You Must read complete instructions/literature along with all the possible adverse reactions/side effects for all the Medicines you take and  that have been prescribed to you. Take any new Medicines after you have completely understood and accept all the possible adverse reactions/side effects.   Please note  You were cared for by a hospitalist during your hospital stay. If you have any questions about your discharge medications or the care you received while you were in the hospital after you are discharged, you can call the unit and asked to speak with the hospitalist on call if the hospitalist that took care of you is not available. Once you are discharged, your primary care physician will handle any further medical issues. Please note that NO REFILLS for any discharge medications will be authorized once you are discharged, as it is imperative that you return to your primary care physician (or establish a relationship with a primary care physician if you do not have one) for your aftercare needs so that they can reassess your need for medications and monitor your lab values. Today   SUBJECTIVE   Had 2 BM's per RN  VITAL SIGNS:  Blood pressure 127/74, pulse 80, temperature 98.1 F (36.7 C), temperature source Oral, resp. rate 19, height 5\' 8"  (1.727 m), weight 65.8 kg (145 lb), SpO2 95 %.  I/O:   Intake/Output Summary (Last 24 hours) at 12/10/15 0802 Last data filed at 12/10/15 [id number]  Gross per 24 hour  Intake          2453.08 ml  Output             1600 ml  Net           853.08 ml    PHYSICAL EXAMINATION:  GENERAL:  82 y.o.-year-old patient lying in the bed with no acute distress.  EYES: Pupils equal, round, reactive to light and accommodation. No scleral icterus. Extraocular muscles intact.  HEENT: Head atraumatic, normocephalic. Oropharynx and nasopharynx clear.  NECK:  Supple, no jugular venous distention. No thyroid enlargement, no tenderness.  LUNGS: Normal breath sounds bilaterally, no wheezing, rales,rhonchi or crepitation. No use of accessory muscles of respiration.  CARDIOVASCULAR: S1, S2 normal. No murmurs, rubs,  or gallops.  ABDOMEN: Soft, non-tender, non-distended. Bowel sounds present. No organomegaly or mass.  EXTREMITIES: No pedal edema, cyanosis, or clubbing.  NEUROLOGIC: Cranial nerves II through XII are intact. Muscle strength 5/5 in all extremities. Sensation intact. Gait not checked.  PSYCHIATRIC: The patient is alert and oriented x 3.  SKIN: No obvious rash, lesion, or ulcer.   DATA REVIEW:   CBC   Recent Labs Lab 12/09/15 0407  WBC 15.2*  HGB 9.6*  HCT 28.5*  PLT 255    Chemistries   Recent Labs Lab 12/07/15 1612 12/08/15 0721  12/10/15 0443  NA 139 138  < > 144  K 3.8 3.8  < > 3.2*  CL 97* 100*  < > 110  CO2 28 26  < > 26  GLUCOSE 103* 89  < > 91  BUN 72* 73*  < > 54*  CREATININE 3.76* 3.50*  < > 1.70*  CALCIUM 9.0 8.1*  < > 8.1*  MG  --  2.0  --   --   AST 19  --   --   --   ALT 17  --   --   --   ALKPHOS 91  --   --   --   BILITOT 1.2  --   --   --   < > = values in this interval not displayed.  Microbiology Results   Recent Results (from the past 240 hour(s))  CULTURE, BLOOD (ROUTINE X 2) w Reflex to ID Panel     Status: None (Preliminary result)   Collection Time: 12/08/15  2:34 AM  Result Value Ref Range Status   Specimen Description BLOOD LEFT HAND  Final   Special Requests BOTTLES DRAWN AEROBIC AND ANAEROBIC 3ML  Final   Culture NO GROWTH 1 DAY  Final   Report Status PENDING  Incomplete  CULTURE, BLOOD (ROUTINE X 2) w Reflex to ID Panel     Status: None (Preliminary result)   Collection Time: 12/08/15  2:34 AM  Result Value Ref Range Status   Specimen Description BLOOD LEFT AC  Final   Special Requests BOTTLES DRAWN AEROBIC AND ANAEROBIC 9ML  Final   Culture NO GROWTH 1 DAY  Final   Report Status PENDING  Incomplete  MRSA PCR Screening     Status: None   Collection Time: 12/08/15  3:35 AM  Result Value Ref Range Status   MRSA by PCR NEGATIVE NEGATIVE Final    Comment:        The GeneXpert MRSA Assay (FDA approved for NASAL specimens only),  is one component of a comprehensive MRSA colonization surveillance program. It is not intended to diagnose MRSA infection nor to guide or monitor treatment for MRSA infections.     RADIOLOGY:  No results found.   Management plans discussed with the patient, family and they are in agreement.  CODE STATUS:     Code Status Orders        Start     Ordered   12/08/15 0550  Full code  Continuous     12/08/15 0611    Code Status History    Date Active Date Inactive Code Status Order ID Comments User Context   11/11/2015  1:52 PM 11/14/2015  4:31 PM Full Code [id number]  Fritzi Mandes, MD Inpatient      TOTAL TIME TAKING CARE OF THIS PATIENT: 40 minutes.    Areanna Gengler M.D on 12/10/2015 at 8:02 AM  Between 7am to 6pm - Pager - [phone] After 6pm go to [email or web address] - password [email or web address] [facility]  [facility]  Office  [phone]  CC: Primary care physician; Loura Pardon, MD

## 2015-12-10 NOTE — Progress Notes (Signed)
   12/10/15 1000  Clinical Encounter Type  Visited With Patient  Visit Type Follow-up  Referral From Chaplain  Chaplain Dan and myself visited with patient and spoke about having Living Will filled out but patient through our assessment doesn't appear to have complete understanding of what form is or what all it entails. We could not go further with completion of form.   [place] [phone]

## 2015-12-10 NOTE — Discharge Instructions (Signed)
Foley care PT as tolerated

## 2015-12-12 ENCOUNTER — Emergency Department: Payer: Medicare HMO

## 2015-12-12 ENCOUNTER — Inpatient Hospital Stay
Admission: EM | Admit: 2015-12-12 | Discharge: 2015-12-15 | DRG: 698 | Disposition: A | Discharge status: Skilled Nursing Facility | Payer: Medicare HMO | Attending: Internal Medicine | Admitting: Internal Medicine

## 2015-12-12 DIAGNOSIS — Z6822 Body mass index (BMI) 22.0-22.9, adult: Secondary | ICD-10-CM

## 2015-12-12 DIAGNOSIS — R400 Somnolence: Secondary | ICD-10-CM | POA: Diagnosis not present

## 2015-12-12 DIAGNOSIS — R627 Adult failure to thrive: Secondary | ICD-10-CM | POA: Diagnosis not present

## 2015-12-12 DIAGNOSIS — Z23 Encounter for immunization: Secondary | ICD-10-CM | POA: Diagnosis not present

## 2015-12-12 DIAGNOSIS — Z888 Allergy status to other drugs, medicaments and biological substances status: Secondary | ICD-10-CM | POA: Diagnosis not present

## 2015-12-12 DIAGNOSIS — Z7401 Bed confinement status: Secondary | ICD-10-CM | POA: Diagnosis not present

## 2015-12-12 DIAGNOSIS — B952 Enterococcus as the cause of diseases classified elsewhere: Secondary | ICD-10-CM | POA: Diagnosis not present

## 2015-12-12 DIAGNOSIS — R630 Anorexia: Secondary | ICD-10-CM | POA: Diagnosis not present

## 2015-12-12 DIAGNOSIS — R4182 Altered mental status, unspecified: Secondary | ICD-10-CM | POA: Diagnosis not present

## 2015-12-12 DIAGNOSIS — E876 Hypokalemia: Secondary | ICD-10-CM | POA: Diagnosis present

## 2015-12-12 DIAGNOSIS — E860 Dehydration: Secondary | ICD-10-CM | POA: Diagnosis not present

## 2015-12-12 DIAGNOSIS — G9341 Metabolic encephalopathy: Secondary | ICD-10-CM | POA: Diagnosis not present

## 2015-12-12 DIAGNOSIS — J309 Allergic rhinitis, unspecified: Secondary | ICD-10-CM | POA: Diagnosis present

## 2015-12-12 DIAGNOSIS — S72002D Fracture of unspecified part of neck of left femur, subsequent encounter for closed fracture with routine healing: Secondary | ICD-10-CM | POA: Diagnosis not present

## 2015-12-12 DIAGNOSIS — Y92122 Bedroom in nursing home as the place of occurrence of the external cause: Secondary | ICD-10-CM

## 2015-12-12 DIAGNOSIS — Z885 Allergy status to narcotic agent status: Secondary | ICD-10-CM | POA: Diagnosis not present

## 2015-12-12 DIAGNOSIS — Z9981 Dependence on supplemental oxygen: Secondary | ICD-10-CM | POA: Diagnosis not present

## 2015-12-12 DIAGNOSIS — N390 Urinary tract infection, site not specified: Secondary | ICD-10-CM

## 2015-12-12 DIAGNOSIS — Z823 Family history of stroke: Secondary | ICD-10-CM | POA: Diagnosis not present

## 2015-12-12 DIAGNOSIS — Z96642 Presence of left artificial hip joint: Secondary | ICD-10-CM | POA: Diagnosis present

## 2015-12-12 DIAGNOSIS — N183 Chronic kidney disease, stage 3 (moderate): Secondary | ICD-10-CM | POA: Diagnosis present

## 2015-12-12 DIAGNOSIS — I129 Hypertensive chronic kidney disease with stage 1 through stage 4 chronic kidney disease, or unspecified chronic kidney disease: Secondary | ICD-10-CM | POA: Diagnosis not present

## 2015-12-12 DIAGNOSIS — N320 Bladder-neck obstruction: Secondary | ICD-10-CM | POA: Diagnosis present

## 2015-12-12 DIAGNOSIS — Z800 Family history of malignant neoplasm of digestive organs: Secondary | ICD-10-CM

## 2015-12-12 DIAGNOSIS — K5909 Other constipation: Secondary | ICD-10-CM | POA: Diagnosis not present

## 2015-12-12 DIAGNOSIS — T83511A Infection and inflammatory reaction due to indwelling urethral catheter, initial encounter: Principal | ICD-10-CM | POA: Diagnosis present

## 2015-12-12 DIAGNOSIS — D649 Anemia, unspecified: Secondary | ICD-10-CM | POA: Diagnosis present

## 2015-12-12 DIAGNOSIS — Z880 Allergy status to penicillin: Secondary | ICD-10-CM | POA: Diagnosis not present

## 2015-12-12 DIAGNOSIS — T83091A Other mechanical complication of indwelling urethral catheter, initial encounter: Secondary | ICD-10-CM | POA: Diagnosis not present

## 2015-12-12 DIAGNOSIS — Z87891 Personal history of nicotine dependence: Secondary | ICD-10-CM

## 2015-12-12 DIAGNOSIS — Z85820 Personal history of malignant melanoma of skin: Secondary | ICD-10-CM | POA: Diagnosis not present

## 2015-12-12 DIAGNOSIS — R404 Transient alteration of awareness: Secondary | ICD-10-CM | POA: Diagnosis not present

## 2015-12-12 DIAGNOSIS — Z8249 Family history of ischemic heart disease and other diseases of the circulatory system: Secondary | ICD-10-CM | POA: Diagnosis not present

## 2015-12-12 DIAGNOSIS — R339 Retention of urine, unspecified: Secondary | ICD-10-CM | POA: Diagnosis not present

## 2015-12-12 DIAGNOSIS — Z91041 Radiographic dye allergy status: Secondary | ICD-10-CM

## 2015-12-12 DIAGNOSIS — W19XXXD Unspecified fall, subsequent encounter: Secondary | ICD-10-CM | POA: Diagnosis not present

## 2015-12-12 DIAGNOSIS — R918 Other nonspecific abnormal finding of lung field: Secondary | ICD-10-CM | POA: Diagnosis not present

## 2015-12-12 LAB — URINALYSIS COMPLETE WITH MICROSCOPIC (ARMC ONLY)
BILIRUBIN URINE: NEGATIVE
Bacteria, UA: NONE SEEN
GLUCOSE, UA: NEGATIVE mg/dL
KETONES UR: NEGATIVE mg/dL
Nitrite: NEGATIVE
Protein, ur: 30 mg/dL — AB
SPECIFIC GRAVITY, URINE: 1.016 (ref 1.005–1.030)
pH: 5 (ref 5.0–8.0)

## 2015-12-12 LAB — CBC WITH DIFFERENTIAL/PLATELET
Basophils Absolute: 0.1 10*3/uL (ref 0–0.1)
Basophils Relative: 1 %
EOS PCT: 6 %
Eosinophils Absolute: 0.5 10*3/uL (ref 0–0.7)
HCT: 29.1 % — ABNORMAL LOW (ref 40.0–52.0)
HEMOGLOBIN: 10 g/dL — AB (ref 13.0–18.0)
LYMPHS ABS: 1.7 10*3/uL (ref 1.0–3.6)
LYMPHS PCT: 20 %
MCH: 29.9 pg (ref 26.0–34.0)
MCHC: 34.2 g/dL (ref 32.0–36.0)
MCV: 87.4 fL (ref 80.0–100.0)
MONOS PCT: 7 %
Monocytes Absolute: 0.6 10*3/uL (ref 0.2–1.0)
NEUTROS PCT: 66 %
Neutro Abs: 5.7 10*3/uL (ref 1.4–6.5)
Platelets: 255 10*3/uL (ref 150–440)
RBC: 3.33 MIL/uL — AB (ref 4.40–5.90)
RDW: 13.9 % (ref 11.5–14.5)
WBC: 8.7 10*3/uL (ref 3.8–10.6)

## 2015-12-12 LAB — COMPREHENSIVE METABOLIC PANEL
ALK PHOS: 67 U/L (ref 38–126)
ALT: 22 U/L (ref 17–63)
AST: 30 U/L (ref 15–41)
Albumin: 2.5 g/dL — ABNORMAL LOW (ref 3.5–5.0)
Anion gap: 6 (ref 5–15)
BUN: 32 mg/dL — ABNORMAL HIGH (ref 6–20)
CALCIUM: 8.2 mg/dL — AB (ref 8.9–10.3)
CO2: 30 mmol/L (ref 22–32)
CREATININE: 1.44 mg/dL — AB (ref 0.61–1.24)
Chloride: 108 mmol/L (ref 101–111)
GFR, EST AFRICAN AMERICAN: 48 mL/min — AB (ref >60)
GFR, EST NON AFRICAN AMERICAN: 42 mL/min — AB (ref >60)
Glucose, Bld: 144 mg/dL — ABNORMAL HIGH (ref 65–99)
Potassium: 2.9 mmol/L — ABNORMAL LOW (ref 3.5–5.1)
Sodium: 144 mmol/L (ref 135–145)
TOTAL PROTEIN: 5.8 g/dL — AB (ref 6.5–8.1)
Total Bilirubin: 0.4 mg/dL (ref 0.3–1.2)

## 2015-12-12 LAB — LIPASE, BLOOD: LIPASE: 32 U/L (ref 11–51)

## 2015-12-12 LAB — LACTIC ACID, PLASMA: Lactic Acid, Venous: 1.3 mmol/L (ref 0.5–1.9)

## 2015-12-12 LAB — MAGNESIUM: Magnesium: 1.5 mg/dL — ABNORMAL LOW (ref 1.7–2.4)

## 2015-12-12 LAB — TROPONIN I: TROPONIN I: 0.03 ng/mL — AB (ref <0.03)

## 2015-12-12 MED ORDER — MIRTAZAPINE 15 MG PO TABS
15.0000 mg | ORAL_TABLET | Freq: Every day | ORAL | Status: DC
  Administered 2015-12-12 – 2015-12-14 (×3): 15 mg via ORAL
  Filled 2015-12-12 (×3): qty 1

## 2015-12-12 MED ORDER — ENOXAPARIN SODIUM 40 MG/0.4ML ~~LOC~~ SOLN
40.0000 mg | SUBCUTANEOUS | Status: DC
  Administered 2015-12-12 – 2015-12-14 (×3): 40 mg via SUBCUTANEOUS
  Filled 2015-12-12 (×3): qty 0.4

## 2015-12-12 MED ORDER — FLEET ENEMA 7-19 GM/118ML RE ENEM: 1.0000 | ENEMA | Freq: Once | RECTAL | Status: DC | PRN

## 2015-12-12 MED ORDER — TAMSULOSIN HCL 0.4 MG PO CAPS
0.4000 mg | ORAL_CAPSULE | Freq: Every day | ORAL | Status: DC
  Administered 2015-12-13 – 2015-12-15 (×3): 0.4 mg via ORAL
  Filled 2015-12-12 (×3): qty 1

## 2015-12-12 MED ORDER — HYDROCODONE-ACETAMINOPHEN 5-325 MG PO TABS: 1.0000 | ORAL_TABLET | ORAL | Status: DC | PRN

## 2015-12-12 MED ORDER — PANTOPRAZOLE SODIUM 40 MG PO TBEC
40.0000 mg | DELAYED_RELEASE_TABLET | ORAL | Status: DC
  Administered 2015-12-13 – 2015-12-15 (×3): 40 mg via ORAL
  Filled 2015-12-12 (×4): qty 1

## 2015-12-12 MED ORDER — AZTREONAM 1 G IJ SOLR
1.0000 g | Freq: Once | INTRAMUSCULAR | Status: AC
  Administered 2015-12-12: 1 g via INTRAVENOUS
  Filled 2015-12-12: qty 1

## 2015-12-12 MED ORDER — DEXTROSE 5 % IV SOLN
500.0000 mg | Freq: Three times a day (TID) | INTRAVENOUS | Status: DC
  Administered 2015-12-13 (×2): 500 mg via INTRAVENOUS
  Filled 2015-12-12 (×4): qty 0.5

## 2015-12-12 MED ORDER — ATORVASTATIN CALCIUM 20 MG PO TABS
20.0000 mg | ORAL_TABLET | Freq: Every day | ORAL | Status: DC
  Administered 2015-12-12 – 2015-12-14 (×3): 20 mg via ORAL
  Filled 2015-12-12 (×3): qty 1

## 2015-12-12 MED ORDER — ONDANSETRON HCL 4 MG PO TABS: 4.0000 mg | ORAL_TABLET | Freq: Four times a day (QID) | ORAL | Status: DC | PRN

## 2015-12-12 MED ORDER — AMLODIPINE BESYLATE 10 MG PO TABS
10.0000 mg | ORAL_TABLET | ORAL | Status: DC
  Administered 2015-12-14 – 2015-12-15 (×2): 10 mg via ORAL
  Filled 2015-12-12 (×3): qty 1

## 2015-12-12 MED ORDER — BISACODYL 10 MG RE SUPP: 10.0000 mg | Freq: Every day | RECTAL | Status: DC | PRN

## 2015-12-12 MED ORDER — POTASSIUM CHLORIDE 10 MEQ/100ML IV SOLN
10.0000 meq | INTRAVENOUS | Status: AC
  Administered 2015-12-12 (×2): 10 meq via INTRAVENOUS
  Filled 2015-12-12 (×2): qty 100

## 2015-12-12 MED ORDER — OXYCODONE HCL 5 MG PO TABS
5.0000 mg | ORAL_TABLET | ORAL | Status: DC | PRN
  Administered 2015-12-13 – 2015-12-14 (×3): 5 mg via ORAL
  Filled 2015-12-12 (×3): qty 1

## 2015-12-12 MED ORDER — POTASSIUM CHLORIDE 20 MEQ PO PACK
40.0000 meq | PACK | Freq: Two times a day (BID) | ORAL | Status: AC
  Administered 2015-12-12 – 2015-12-14 (×4): 40 meq via ORAL
  Filled 2015-12-12 (×4): qty 2

## 2015-12-12 MED ORDER — SODIUM CHLORIDE 0.9 % IV BOLUS (SEPSIS)
1000.0000 mL | Freq: Once | INTRAVENOUS | Status: AC
  Administered 2015-12-12: 1000 mL via INTRAVENOUS

## 2015-12-12 MED ORDER — ACETAMINOPHEN 650 MG RE SUPP: 650.0000 mg | Freq: Four times a day (QID) | RECTAL | Status: DC | PRN

## 2015-12-12 MED ORDER — SODIUM CHLORIDE 0.9 % IV SOLN
INTRAVENOUS | Status: DC
  Administered 2015-12-12 – 2015-12-14 (×3): via INTRAVENOUS

## 2015-12-12 MED ORDER — SENNOSIDES-DOCUSATE SODIUM 8.6-50 MG PO TABS: 1.0000 | ORAL_TABLET | Freq: Every evening | ORAL | Status: DC | PRN

## 2015-12-12 MED ORDER — ASPIRIN 81 MG PO CHEW
81.0000 mg | CHEWABLE_TABLET | Freq: Every day | ORAL | Status: DC
  Administered 2015-12-13 – 2015-12-15 (×3): 81 mg via ORAL
  Filled 2015-12-12 (×3): qty 1

## 2015-12-12 MED ORDER — DOCUSATE SODIUM 100 MG PO CAPS
100.0000 mg | ORAL_CAPSULE | Freq: Two times a day (BID) | ORAL | Status: DC
  Administered 2015-12-12 – 2015-12-15 (×6): 100 mg via ORAL
  Filled 2015-12-12 (×6): qty 1

## 2015-12-12 MED ORDER — ACETAMINOPHEN 325 MG PO TABS: 650.0000 mg | ORAL_TABLET | Freq: Four times a day (QID) | ORAL | Status: DC | PRN

## 2015-12-12 MED ORDER — ONDANSETRON HCL 4 MG/2ML IJ SOLN: 4.0000 mg | Freq: Four times a day (QID) | INTRAMUSCULAR | Status: DC | PRN

## 2015-12-12 MED ORDER — PRO-STAT SUGAR FREE PO LIQD
30.0000 mL | Freq: Every day | ORAL | Status: DC
  Administered 2015-12-13 – 2015-12-15 (×3): 30 mL via ORAL

## 2015-12-12 NOTE — H&P (Addendum)
[place] @ [facility] Admission History and Physical Harvie Bridge, D.O.  ---------------------------------------------------------------------------------------------------------------------   PATIENT NAME: Matthew [name] MR#: [id number] DATE OF BIRTH: 12-16-1924 DATE OF ADMISSION: 12/12/2015 PRIMARY CARE PHYSICIAN: Loura Pardon, MD  REQUESTING/REFERRING PHYSICIAN: ED Dr. Jacqualine Code  CHIEF COMPLAINT: Chief Complaint  Patient presents with  . Altered Mental Status    HISTORY OF PRESENT ILLNESS: Matthew [name] is a 82 y.o. male with a known history of CK D stage III, hypertension, chronic constipation and allergic rhinitis was recently admitted for acute kidney injury on chronic kidney disease likely secondary to dehydration as well as bladder outlet obstruction, failure to thrive, acute fecal impaction.  He was discharged 2 days ago with a Foley catheter placed and returns to the emergency department today with a complaint of altered mental status, weakness, confusion, increased somnolence and again decreased by mouth intake. According to the emergency department physician's patient was somnolent on his arrival but has improved after receiving IV fluids here in the emergency department. On my questioning the patient complains only of fatigue and loss of sense of taste. He states that his decreased appetite is secondary to the fact that he cannot taste his food. He is awake and alert and denies all other complaints.  Otherwise there has been no change in status. Patient has been taking medication as prescribed and there has been no recent change in medication or diet.  There has been no recent illness, travel or sick contacts.    Patient denies fevers/chills, weakness, dizziness, chest pain, shortness of breath, N/V/C/D, abdominal pain, dysuria/frequency, changes in mental status.   EMS/ED COURSE:   Patient received Azactam and 1 L bolus of normal saline  PAST MEDICAL  HISTORY: Past Medical History:  Diagnosis Date  . Allergic rhinitis   . Chronic constipation   . Chronic kidney disease    stage 3, [place] benign appearing cysts 05/2006  . History of CT scan of chest 6/09   calcification consistent with asbestos exp, also pleural nodule, incidental gallstones  . History of transfusion of whole blood   . Hypertension   . Melanoma ([place])    history of, by right eye  . Pulmonary nodules    being obs with serial CT, 6 month f/u stable 10/05  . Renal insufficiency       PAST SURGICAL HISTORY: Past Surgical History:  Procedure Laterality Date  . 2D echo  6/09   slight thickening of aortic valve with EF of 60%  . GANGLION CYST EXCISION     hands  . HIP ARTHROPLASTY Left 11/11/2015   Procedure: ARTHROPLASTY BIPOLAR HIP (HEMIARTHROPLASTY);  Surgeon: Corky Mull, MD;  Location: [facility] ORS;  Service: Orthopedics;  Laterality: Left;      SOCIAL HISTORY: Social History  Substance Use Topics  . Smoking status: Former Research scientist (life sciences)  . Smokeless tobacco: Never Used     Comment: Quit smoking in 1957.  Marland Kitchen Alcohol use No      FAMILY HISTORY: Family History  Problem Relation Age of Onset  . Hypertension Mother   . Hypertension Father   . Stroke Father   . Cancer Brother     pancreatic     MEDICATIONS AT HOME: Prior to Admission medications   Medication Sig Start Date End Date Taking? Authorizing Provider  Amino Acids-Protein Hydrolys (FEEDING SUPPLEMENT, PRO-STAT SUGAR FREE 64,) LIQD Take 30 mLs by mouth daily. 12/10/15  Yes Fritzi Mandes, MD  amLODipine (NORVASC) 10 MG tablet Take 10 mg by mouth  every morning.    Yes Historical Provider, MD  aspirin 81 MG tablet Take 81 mg by mouth daily.     Yes Historical Provider, MD  atorvastatin (LIPITOR) 20 MG tablet Take 20 mg by mouth at bedtime.    Yes Historical Provider, MD  docusate sodium (COLACE) 100 MG capsule Take 1 capsule (100 mg total) by mouth 2 (two) times daily. 12/10/15  Yes Fritzi Mandes, MD  mirtazapine  (REMERON) 15 MG tablet Take 1 tablet (15 mg total) by mouth at bedtime. 12/10/15  Yes Fritzi Mandes, MD  oxyCODONE (OXY IR/ROXICODONE) 5 MG immediate release tablet Take 5 mg by mouth every 3 (three) hours as needed for moderate pain or severe pain.   Yes Historical Provider, MD  pantoprazole (PROTONIX) 40 MG tablet Take 1 tablet (40 mg total) by mouth daily. Patient taking differently: Take 40 mg by mouth every morning.  11/12/15  Yes Vaughan Basta, MD  tamsulosin (FLOMAX) 0.4 MG CAPS capsule Take 1 capsule (0.4 mg total) by mouth daily. 12/10/15  Yes Fritzi Mandes, MD  tuberculin (TUBERSOL) 5 UNIT/0.1ML injection Inject 5 Units into the skin See admin instructions. Inject 0.1 ml (5 units) intradermally every evening shift every 14 days for ppd until 12/25/15.   Yes Historical Provider, MD  UNABLE TO FIND See admin instructions. Medpass 2.0  Use as directed 2 times a day for supplement.   Yes Historical Provider, MD  feeding supplement, ENSURE ENLIVE, (ENSURE ENLIVE) LIQD Take 237 mLs by mouth 2 (two) times daily between meals. Patient not taking: Reported on 12/12/2015 12/10/15   Fritzi Mandes, MD      DRUG ALLERGIES: Allergies  Allergen Reactions  . Chlorthalidone     REACTION: frequent urination  . Codeine     REACTION: nausea  . Iodine     REACTION: caused bubble to come up in eye  . Penicillins Other (See Comments)    REACTION: GI Has patient had a PCN reaction causing immediate rash, facial/tongue/throat swelling, SOB or lightheadedness with hypotension: No Has patient had a PCN reaction causing severe rash involving mucus membranes or skin necrosis: No Has patient had a PCN reaction that required hospitalization: pt not sure Has patient had a PCN reaction occurring within the last 10 years: pt not sure If all of the above answers are "NO", then may proceed with Cephalosporin use.  . Ramipril     REACTION: cough  . Ropinirole Hydrochloride     REACTION: insomnia     REVIEW OF  SYSTEMS: CONSTITUTIONAL: No fever/chills, fatigue, weakness, weight gain/loss, headache EYES: No blurry or double vision. ENT: No tinnitus, postnasal drip, redness or soreness of the oropharynx. Positive for loss of sense of taste and decreased appetite RESPIRATORY: No cough, wheeze, hemoptysis, dyspnea. CARDIOVASCULAR: No chest pain, orthopnea, palpitations, syncope. GASTROINTESTINAL: No nausea, vomiting, constipation, diarrhea, abdominal pain, hematemesis, melena or hematochezia. GENITOURINARY: No dysuria or hematuria. ENDOCRINE: No polyuria or nocturia. No heat or cold intolerance. HEMATOLOGY: No anemia, bruising, bleeding. INTEGUMENTARY: No rashes, ulcers, lesions. MUSCULOSKELETAL: No arthritis, swelling, gout. NEUROLOGIC: No numbness, tingling, weakness or ataxia. No seizure-type activity. PSYCHIATRIC: No anxiety, depression, insomnia.  PHYSICAL EXAMINATION: VITAL SIGNS: Blood pressure 118/64, pulse 76, temperature 99.6 F (37.6 C), temperature source Rectal, resp. rate 16, height 5\' 8"  (1.727 m), weight 65.8 kg (145 lb), SpO2 96 %.  GENERAL: 82 y.o.-year-old male patient, well-developed, well-nourished lying in the bed in no acute distress.  Pleasant and cooperative.   HEENT: Head atraumatic, normocephalic. Pupils equal, round,  reactive to light and accommodation. No scleral icterus. Extraocular muscles intact. Nares are patent. Oropharynx is clear. Mucus membranes are very dry. NECK: Supple, full range of motion. No JVD, no bruit heard. No thyroid enlargement, no tenderness, no cervical lymphadenopathy. CHEST: Normal breath sounds bilaterally. No wheezing, rales, rhonchi or crackles. No use of accessory muscles of respiration.  No reproducible chest wall tenderness.  CARDIOVASCULAR: S1, S2 normal. No murmurs, rubs, or gallops. Cap refill <2 seconds. ABDOMEN: Soft, nontender, nondistended. No rebound, guarding, rigidity. Normoactive bowel sounds present in all four quadrants. No  organomegaly or mass. EXTREMITIES: Full range of motion. No pedal edema, cyanosis, or clubbing. NEUROLOGIC: Patient has weak speech which is sometimes inaudible and unclear. Cranial nerves II through XII are grossly intact with no focal sensorimotor deficit. Muscle strength week but symmetric in all extremities. Sensation intact. Gait not checked. PSYCHIATRIC: The patient is alert and oriented x 3. Normal affect, mood, thought content. SKIN: Warm, dry, and intact without obvious rash, lesion, or ulcer.  LABORATORY PANEL:  CBC  Recent Labs Lab 12/12/15 1812  WBC 8.7  HGB 10.0*  HCT 29.1*  PLT 255   ----------------------------------------------------------------------------------------------------------------- Chemistries  Recent Labs Lab 12/08/15 0721  12/12/15 1812  NA 138  < > 144  K 3.8  < > 2.9*  CL 100*  < > 108  CO2 26  < > 30  GLUCOSE 89  < > 144*  BUN 73*  < > 32*  CREATININE 3.50*  < > 1.44*  CALCIUM 8.1*  < > 8.2*  MG 2.0  --   --   AST  --   --  30  ALT  --   --  22  ALKPHOS  --   --  67  BILITOT  --   --  0.4  < > = values in this interval not displayed. ------------------------------------------------------------------------------------------------------------------ Cardiac Enzymes  Recent Labs Lab 12/12/15 1812  TROPONINI 0.03*   ------------------------------------------------------------------------------------------------------------------  RADIOLOGY: Ct Head Wo Contrast  Result Date: 12/12/2015 CLINICAL DATA:  Hypertension with altered mental status. EXAM: CT HEAD WITHOUT CONTRAST TECHNIQUE: Contiguous axial images were obtained from the base of the skull through the vertex without intravenous contrast. COMPARISON:  None. FINDINGS: The brain shows generalized atrophy. There are extensive chronic appearing small vessel ischemic changes affecting the pons, cerebellum, thalami and cerebral hemispheric white matter. No evidence of large vessel insult.  No identifiable acute infarction. No mass lesion, hemorrhage, hydrocephalus or extra-axial collection. No calvarial abnormality. Sinuses are clear. There is atherosclerotic calcification of the major vessels at the base of the brain. IMPRESSION: No acute finding. Extensive chronic small vessel ischemic changes throughout the brain. Electronically Signed   By: Nelson Chimes M.D.   On: 12/12/2015 18:46   Dg Chest Portable 1 View  Result Date: 12/12/2015 CLINICAL DATA:  Acute onset of hypotension. Altered mental status. Initial encounter. EXAM: PORTABLE CHEST 1 VIEW COMPARISON:  Chest radiograph performed 12/08/2015, and CT of the chest performed 02/08/2004 FINDINGS: The lungs are well-aerated. Mild left basilar opacity may reflect the previously noted pleural calcification. There is no evidence of pleural effusion or pneumothorax. The cardiomediastinal silhouette is within normal limits. No acute osseous abnormalities are seen. IMPRESSION: Mild left basilar opacity may reflect previously noted pleural calcification. Lungs otherwise grossly clear. Electronically Signed   By: Garald Balding M.D.   On: 12/12/2015 18:47    EKG: Normal sinus rhythm at 77 bpm with no significant ST or T-wave abnormalities.  IMPRESSION AND PLAN:  This is a 82 y.o. male with a history of chronic kidney disease stage III, hypertension, chronic constipation, allergic rhinitis, recent hospitalization for acute kidney injury secondary to dehydration and bladder outlet obstruction now being admitted with: 1. Altered mental status likely multifactorial-patient is dehydrated however kidney function remains improved from recent hospitalization. It appears that he does have a urinary tract infection which is catheter associated and was present prior to his arrival here in the hospital today. We'll therefore admit to the hospital for IV fluid hydration, IV antibiotics with Azactam, urine culture.  2. Hypokalemia - will replace PO & IV.  3.  Failure to thrive-again patient reported a decreased appetite and decreased by mouth intake secondary to his loss of sense of taste. He has no desire to eat because of this. We will pursue physical therapy evaluation to prevent deconditioning. 4. Anemia which is at baseline. 5. Chronic constipation-we'll maintain patient's bowel regimen and persistent enemas if needed to prevent fecal impaction. His belly is currently soft and nontender.  Diet/Nutrition: Continue home regimen Fluids: IV normal saline DVT Px: Lovenox, SCDs and early ambulation  All the records are reviewed and case discussed with ED provider. Management plans discussed with the patient and/or family who express understanding and agree with plan of care.  CODE STATUS: Full TOTAL TIME TAKING CARE OF THIS PATIENT: 60 minutes.   Amberle Lyter D.O. on 12/12/2015 at 8:38 PM Between 7am to 6pm - Pager - [phone] After 6pm go to [email or web address] - [employer] [place] [facility] Office [phone] CC: Primary care physician; Loura Pardon, MD     Note: This dictation was prepared with Dragon dictation along with smaller phrase technology. Any transcriptional errors that result from this process are unintentional.

## 2015-12-12 NOTE — ED Provider Notes (Signed)
[facility] Emergency Department Provider Note   ____________________________________________   First MD Initiated Contact with Patient 12/12/15 1814     (approximate)  I have reviewed the triage vital signs and the nursing notes.   HISTORY  Chief Complaint Altered Mental Status  EM caveat: Patient very weak and somnolent, difficulty recalling recent events  HPI KYUNG WOODMAN is a 82 y.o. male recent admission for acute renal failure, required Foley catheter insertion. Discharge to nursing facility.  The EMS providers report the patient has had increased weakness and confusion noted today. At time of onset was not clear, but the patient was noted to be very tired and generally fatigued. He has a Foley catheter, and his vital signs a been stable with EMS.  Patient himself reports no pain. He reports he feels very "tired".  Past Medical History:  Diagnosis Date  . Allergic rhinitis   . Chronic constipation   . Chronic kidney disease    stage 3, [place] benign appearing cysts 05/2006  . History of CT scan of chest 6/09   calcification consistent with asbestos exp, also pleural nodule, incidental gallstones  . History of transfusion of whole blood   . Hypertension   . Melanoma ([place])    history of, by right eye  . Pulmonary nodules    being obs with serial CT, 6 month f/u stable 10/05  . Renal insufficiency     Patient Active Problem List   Diagnosis Date Noted  . Altered mental status 12/12/2015  . Acute renal failure (ARF) ([place]) 12/08/2015  . Hip fracture ([place]) 11/11/2015  . Encounter for Medicare annual wellness exam 07/09/2013  . Fatigue 07/09/2013  . Hx laparoscopic cholecystectomy 05/16/2013  . Prostate cancer screening 06/04/2011  . CONSTIPATION, CHRONIC 01/12/2010  . Nonspecific (abnormal) findings on radiological and other examination of body structure 10/07/2007  . ALLERGIC CONJUNCTIVITIS 10/04/2007  . EDEMA 10/04/2007  .  HYPERCHOLESTEROLEMIA 08/10/2006  . RESTLESS LEG SYNDROME 08/10/2006  . Essential hypertension 08/10/2006  . ALLERGIC RHINITIS 08/10/2006  . Renal insufficiency 08/10/2006  . SKIN CANCER, HX OF 08/10/2006    Past Surgical History:  Procedure Laterality Date  . 2D echo  6/09   slight thickening of aortic valve with EF of 60%  . GANGLION CYST EXCISION     hands  . HIP ARTHROPLASTY Left 11/11/2015   Procedure: ARTHROPLASTY BIPOLAR HIP (HEMIARTHROPLASTY);  Surgeon: Corky Mull, MD;  Location: [facility] ORS;  Service: Orthopedics;  Laterality: Left;    Prior to Admission medications   Medication Sig Start Date End Date Taking? Authorizing Provider  Amino Acids-Protein Hydrolys (FEEDING SUPPLEMENT, PRO-STAT SUGAR FREE 64,) LIQD Take 30 mLs by mouth daily. 12/10/15  Yes Fritzi Mandes, MD  amLODipine (NORVASC) 10 MG tablet Take 10 mg by mouth every morning.    Yes Historical Provider, MD  aspirin 81 MG tablet Take 81 mg by mouth daily.     Yes Historical Provider, MD  atorvastatin (LIPITOR) 20 MG tablet Take 20 mg by mouth at bedtime.    Yes Historical Provider, MD  docusate sodium (COLACE) 100 MG capsule Take 1 capsule (100 mg total) by mouth 2 (two) times daily. 12/10/15  Yes Fritzi Mandes, MD  mirtazapine (REMERON) 15 MG tablet Take 1 tablet (15 mg total) by mouth at bedtime. 12/10/15  Yes Fritzi Mandes, MD  oxyCODONE (OXY IR/ROXICODONE) 5 MG immediate release tablet Take 5 mg by mouth every 3 (three) hours as needed for moderate pain or severe  pain.   Yes Historical Provider, MD  pantoprazole (PROTONIX) 40 MG tablet Take 1 tablet (40 mg total) by mouth daily. Patient taking differently: Take 40 mg by mouth every morning.  11/12/15  Yes Vaughan Basta, MD  tamsulosin (FLOMAX) 0.4 MG CAPS capsule Take 1 capsule (0.4 mg total) by mouth daily. 12/10/15  Yes Fritzi Mandes, MD  tuberculin (TUBERSOL) 5 UNIT/0.1ML injection Inject 5 Units into the skin See admin instructions. Inject 0.1 ml (5 units) intradermally  every evening shift every 14 days for ppd until 12/25/15.   Yes Historical Provider, MD  UNABLE TO FIND See admin instructions. Medpass 2.0  Use as directed 2 times a day for supplement.   Yes Historical Provider, MD  feeding supplement, ENSURE ENLIVE, (ENSURE ENLIVE) LIQD Take 237 mLs by mouth 2 (two) times daily between meals. Patient not taking: Reported on 12/12/2015 12/10/15   Fritzi Mandes, MD    Allergies Chlorthalidone; Codeine; Iodine; Penicillins; Ramipril; and Ropinirole hydrochloride  Family History  Problem Relation Age of Onset  . Hypertension Mother   . Hypertension Father   . Stroke Father   . Cancer Brother     pancreatic    Social History Social History  Substance Use Topics  . Smoking status: Former Research scientist (life sciences)  . Smokeless tobacco: Never Used     Comment: Quit smoking in 1957.  Marland Kitchen Alcohol use No    Review of Systems Constitutional:She reports feeling very tired, reports he cannot sit up due to feeling very weak Cardiovascular: Denies chest pain. Respiratory: Denies shortness of breath. Gastrointestinal: No abdominal pain. Skin: Negative for rash. Neurological: Negative for headaches, not noticed any weakness in one arm or leg. Denies headache. Just reports feeling very weak.    ____________________________________________   PHYSICAL EXAM:  VITAL SIGNS: ED Triage Vitals  Enc Vitals Group     BP 12/12/15 1811 102/63     Pulse Rate 12/12/15 1811 77     Resp 12/12/15 1811 (!) 24     Temp 12/12/15 1811 99.6 F (37.6 C)     Temp Source 12/12/15 1811 Rectal     SpO2 12/12/15 1800 94 %     Weight 12/12/15 1812 145 lb (65.8 kg)     Height 12/12/15 1812 5\' 8"  (1.727 m)     Head Circumference --      Peak Flow --      Pain Score --      Pain Loc --      Pain Edu? --      Excl. in [place]? --     Constitutional: Patient somnolent, arouses to tactile stimuli. Does open eyes occasionally spontaneously. He is protecting his airway well but appears very  somnolent. Eyes: Conjunctivae are normal. PERRL. EOMI. Head: Atraumatic. Nose: No congestion/rhinnorhea. Mouth/Throat: Mucous membranes are extremely dry.  Oropharynx non-erythematous. Neck: No stridor.   Cardiovascular: Normal rate, regular rhythm. Grossly normal heart sounds.  Good peripheral circulation. Respiratory: Normal respiratory effort.  No retractions. Lungs CTAB. Slightly shallow respirations at times. Gastrointestinal: Soft and nontender. No distention. Foley catheter with cloudy appearing urine draining. Musculoskeletal: No lower extremity tenderness nor edema. Neurologic:  Very weak speech. Hard to understand, but is able to tell his name and the year. He falls back towards sleep after about 10 seconds, and appears very somnolent with mild alteration in mental status. Able to lift up both hands without pronator drift, wiggles toes and has approximately 2 out of 5 strength in both legs equal bilateral. No obvious  facial droop. Skin:  Skin is warm, dry and intact. No rash noted. Patient rolled onto his side and evaluated, left surgical wound clean dry and intact with stable line noted normal. Minimal erythema without ulceration noted over the sacrum. Psychiatric: Mood and affect are difficult to assess due to severe somnolence. ____________________________________________   LABS (all labs ordered are listed, but only abnormal results are displayed)  Labs Reviewed  CBC WITH DIFFERENTIAL/PLATELET - Abnormal; Notable for the following:       Result Value   RBC 3.33 (*)    Hemoglobin 10.0 (*)    HCT 29.1 (*)    All other components within normal limits  COMPREHENSIVE METABOLIC PANEL - Abnormal; Notable for the following:    Potassium 2.9 (*)    Glucose, Bld 144 (*)    BUN 32 (*)    Creatinine, Ser 1.44 (*)    Calcium 8.2 (*)    Total Protein 5.8 (*)    Albumin 2.5 (*)    GFR calc non Af Amer 42 (*)    GFR calc Af Amer 48 (*)    All other components within normal limits   URINALYSIS COMPLETEWITH MICROSCOPIC ([facility] ONLY) - Abnormal; Notable for the following:    Color, Urine YELLOW (*)    APPearance HAZY (*)    Hgb urine dipstick 2+ (*)    Protein, ur 30 (*)    Leukocytes, UA 3+ (*)    Squamous Epithelial / LPF 0-5 (*)    All other components within normal limits  TROPONIN I - Abnormal; Notable for the following:    Troponin I 0.03 (*)    All other components within normal limits  URINE CULTURE  LIPASE, BLOOD  LACTIC ACID, PLASMA  LACTIC ACID, PLASMA   ____________________________________________  EKG  Reviewed and interpreted by me at 1820 Normal sinus rhythm Heart rate 77 QRS 90 QTc 450 Normal sinus rhythm, no evidence of ectopy or ischemic abnormality ____________________________________________  RADIOLOGY  Ct Head Wo Contrast  Result Date: 12/12/2015 CLINICAL DATA:  Hypertension with altered mental status. EXAM: CT HEAD WITHOUT CONTRAST TECHNIQUE: Contiguous axial images were obtained from the base of the skull through the vertex without intravenous contrast. COMPARISON:  None. FINDINGS: The brain shows generalized atrophy. There are extensive chronic appearing small vessel ischemic changes affecting the pons, cerebellum, thalami and cerebral hemispheric white matter. No evidence of large vessel insult. No identifiable acute infarction. No mass lesion, hemorrhage, hydrocephalus or extra-axial collection. No calvarial abnormality. Sinuses are clear. There is atherosclerotic calcification of the major vessels at the base of the brain. IMPRESSION: No acute finding. Extensive chronic small vessel ischemic changes throughout the brain. Electronically Signed   By: Nelson Chimes M.D.   On: 12/12/2015 18:46   Dg Chest Portable 1 View  Result Date: 12/12/2015 CLINICAL DATA:  Acute onset of hypotension. Altered mental status. Initial encounter. EXAM: PORTABLE CHEST 1 VIEW COMPARISON:  Chest radiograph performed 12/08/2015, and CT of the chest performed  02/08/2004 FINDINGS: The lungs are well-aerated. Mild left basilar opacity may reflect the previously noted pleural calcification. There is no evidence of pleural effusion or pneumothorax. The cardiomediastinal silhouette is within normal limits. No acute osseous abnormalities are seen. IMPRESSION: Mild left basilar opacity may reflect previously noted pleural calcification. Lungs otherwise grossly clear. Electronically Signed   By: Garald Balding M.D.   On: 12/12/2015 18:47     ____________________________________________   PROCEDURES  Procedure(s) performed: None  Procedures  Critical Care performed: No  ____________________________________________   INITIAL IMPRESSION / ASSESSMENT AND PLAN / ED COURSE  Pertinent labs & imaging results that were available during my care of the patient were reviewed by me and considered in my medical decision making (see chart for details).    Clinical Course   Patient presents for weakness and alteration in mental status. He appears very fatigued, but no focal deficits. Patient appears to have somnolence, mild alteration in mental status but is protecting his airway well. Hemodynamically stable, we will evaluate broadly for etiology though based on his Foley catheter drainage be very suspicious for a possible urinary infection.  ----------------------------------------- 8:41 PM on 12/12/2015 -----------------------------------------  After 1 L of fluid the patient is more alert, he is able to tell me the year and date. He appears improving overall, but does appear to have mild ongoing somnolence. Urinary infection is suspected, and we will start him on his history and exam. Await culture data. No evidence of acute neurologic cardiac or pulmonary disease.  Because of suspected infection with alteration in mental status and somnolence we will admit him to the hospital for further workup.  ____________________________________________   FINAL  CLINICAL IMPRESSION(S) / ED DIAGNOSES  Final diagnoses:  Somnolence  Urinary tract infection associated with catheterization of urinary tract, unspecified indwelling urinary catheter type, initial encounter ([place])  Acute urinary tract infection      NEW MEDICATIONS STARTED DURING THIS VISIT:  New Prescriptions   No medications on file     Note:  This document was prepared using Dragon voice recognition software and may include unintentional dictation errors.     Delman Kitten, MD 12/12/15 2042

## 2015-12-12 NOTE — Progress Notes (Signed)
Pharmacy Antibiotic Note  Matthew [name] is a 82 y.o. male admitted on 12/12/2015 with UTI.  Pharmacy has been consulted for aztreonam dosing.  Plan: Aztreonam 500 mg IV q8h  Height: 5\' 8"  (172.7 cm) Weight: 145 lb (65.8 kg) IBW/kg (Calculated) : 68.4  Temp (24hrs), Avg:99.6 F (37.6 C), Min:99.6 F (37.6 C), Max:99.6 F (37.6 C)   Recent Labs Lab 12/07/15 1612 12/08/15 0301 12/08/15 0546 12/08/15 0721 12/09/15 0407 12/10/15 0443 12/12/15 1806 12/12/15 1812  WBC 20.9*  --   --   --  15.2*  --   --  8.7  CREATININE 3.76*  --   --  3.50* 2.93* 1.70*  --  1.44*  LATICACIDVEN  --  0.8 0.8  --   --   --  1.3  --     Estimated Creatinine Clearance: 33 mL/min (by C-G formula based on SCr of 1.44 mg/dL).    Allergies  Allergen Reactions  . Chlorthalidone     REACTION: frequent urination  . Codeine     REACTION: nausea  . Iodine     REACTION: caused bubble to come up in eye  . Penicillins Other (See Comments)    REACTION: GI Has patient had a PCN reaction causing immediate rash, facial/tongue/throat swelling, SOB or lightheadedness with hypotension: No Has patient had a PCN reaction causing severe rash involving mucus membranes or skin necrosis: No Has patient had a PCN reaction that required hospitalization: pt not sure Has patient had a PCN reaction occurring within the last 10 years: pt not sure If all of the above answers are "NO", then may proceed with Cephalosporin use.  . Ramipril     REACTION: cough  . Ropinirole Hydrochloride     REACTION: insomnia    Thank you for allowing pharmacy to be a part of this patient's care.  Darylene Price [facility] 12/12/2015 9:32 PM

## 2015-12-12 NOTE — ED Triage Notes (Signed)
Pt to ED from [name] via [place]. Per EMS staff at [name] report pt hypotensive at 83/50 with altered mental status. Pt alert and oriented X3 in no acute distress at this time.

## 2015-12-13 ENCOUNTER — Ambulatory Visit: Payer: Self-pay | Admitting: Urology

## 2015-12-13 LAB — CBC
HCT: 29.1 % — ABNORMAL LOW (ref 40.0–52.0)
HEMOGLOBIN: 9.8 g/dL — AB (ref 13.0–18.0)
MCH: 29.7 pg (ref 26.0–34.0)
MCHC: 33.6 g/dL (ref 32.0–36.0)
MCV: 88.4 fL (ref 80.0–100.0)
PLATELETS: 230 10*3/uL (ref 150–440)
RBC: 3.29 MIL/uL — AB (ref 4.40–5.90)
RDW: 13.9 % (ref 11.5–14.5)
WBC: 9.2 10*3/uL (ref 3.8–10.6)

## 2015-12-13 LAB — BASIC METABOLIC PANEL
ANION GAP: 6 (ref 5–15)
BUN: 30 mg/dL — ABNORMAL HIGH (ref 6–20)
CALCIUM: 8.1 mg/dL — AB (ref 8.9–10.3)
CO2: 30 mmol/L (ref 22–32)
CREATININE: 1.18 mg/dL (ref 0.61–1.24)
Chloride: 110 mmol/L (ref 101–111)
GFR, EST NON AFRICAN AMERICAN: 53 mL/min — AB (ref >60)
Glucose, Bld: 103 mg/dL — ABNORMAL HIGH (ref 65–99)
Potassium: 3.1 mmol/L — ABNORMAL LOW (ref 3.5–5.1)
SODIUM: 146 mmol/L — AB (ref 135–145)

## 2015-12-13 LAB — CULTURE, BLOOD (ROUTINE X 2)
CULTURE: NO GROWTH
Culture: NO GROWTH

## 2015-12-13 MED ORDER — LORAZEPAM 2 MG/ML IJ SOLN: 1.0000 mg | Freq: Four times a day (QID) | INTRAMUSCULAR | Status: DC | PRN

## 2015-12-13 MED ORDER — POLYETHYLENE GLYCOL 3350 17 G PO PACK: 17.0000 g | PACK | Freq: Every day | ORAL | Status: DC | PRN

## 2015-12-13 MED ORDER — SENNOSIDES-DOCUSATE SODIUM 8.6-50 MG PO TABS
2.0000 | ORAL_TABLET | Freq: Two times a day (BID) | ORAL | Status: DC
  Administered 2015-12-13 – 2015-12-15 (×5): 2 via ORAL
  Filled 2015-12-13 (×5): qty 2

## 2015-12-13 MED ORDER — MAGNESIUM SULFATE 2 GM/50ML IV SOLN
2.0000 g | Freq: Once | INTRAVENOUS | Status: AC
  Administered 2015-12-13: 2 g via INTRAVENOUS
  Filled 2015-12-13: qty 50

## 2015-12-13 MED ORDER — DEXTROSE 5 % IV SOLN
1.0000 g | INTRAVENOUS | Status: DC
  Administered 2015-12-14: 1 g via INTRAVENOUS
  Filled 2015-12-13 (×2): qty 10

## 2015-12-13 MED ORDER — DEXTROSE 5 % IV SOLN
1.0000 g | INTRAVENOUS | Status: DC
  Administered 2015-12-13: 1 g via INTRAVENOUS
  Filled 2015-12-13: qty 10

## 2015-12-13 NOTE — Care Management Important Message (Signed)
Important Message  Patient Details  Name: Matthew [name] MRN: [id number] Date of Birth: 06/10/1924   Medicare Important Message Given:  Yes    Alvie Heidelberg, RN 12/13/2015, 9:50 AM

## 2015-12-13 NOTE — Progress Notes (Signed)
[place] [facility] [place] NAME: Remus Moga    MR#:  [id number]  DATE OF BIRTH:  [birth year]  SUBJECTIVE:  CHIEF COMPLAINT:   Chief Complaint  Patient presents with  . Altered Mental Status   Confused and restless. Patient has no concerns. Afebrile.  REVIEW OF SYSTEMS:    Review of Systems  Unable to perform ROS: Mental status change    DRUG ALLERGIES:   Allergies  Allergen Reactions  . Chlorthalidone     REACTION: frequent urination  . Codeine     REACTION: nausea  . Iodine     REACTION: caused bubble to come up in eye  . Penicillins Other (See Comments)    REACTION: GI Has patient had a PCN reaction causing immediate rash, facial/tongue/throat swelling, SOB or lightheadedness with hypotension: No Has patient had a PCN reaction causing severe rash involving mucus membranes or skin necrosis: No Has patient had a PCN reaction that required hospitalization: pt not sure Has patient had a PCN reaction occurring within the last 10 years: pt not sure If all of the above answers are "NO", then may proceed with Cephalosporin use.  . Ramipril     REACTION: cough  . Ropinirole Hydrochloride     REACTION: insomnia    VITALS:  Blood pressure (!) 112/53, pulse 77, temperature 99 F (37.2 C), temperature source Oral, resp. rate 18, height 5\' 8"  (1.727 m), weight 65.8 kg (145 lb), SpO2 96 %.  PHYSICAL EXAMINATION:   Physical Exam  GENERAL:  82 y.o.-year-old patient lying in the bed with no acute distress.  EYES: Pupils equal, round, reactive to light and accommodation. No scleral icterus. Extraocular muscles intact.  HEENT: Head atraumatic, normocephalic. Oropharynx and nasopharynx clear.  NECK:  Supple, no jugular venous distention. No thyroid enlargement, no tenderness.  LUNGS: Normal breath sounds bilaterally, no wheezing, rales, rhonchi. No use of accessory muscles of respiration.  CARDIOVASCULAR: S1, S2 normal. No murmurs,  rubs, or gallops.  ABDOMEN: Soft, nontender, nondistended. Bowel sounds present. No organomegaly or mass. Foley catheter in place EXTREMITIES: No cyanosis, clubbing or edema b/l.    NEUROLOGIC: Cranial nerves II through XII are intact. No focal Motor or sensory deficits b/l.   PSYCHIATRIC: The patient is alert and awake, confused SKIN: No obvious rash, lesion, or ulcer.   LABORATORY PANEL:   CBC  Recent Labs Lab 12/13/15 0404  WBC 9.2  HGB 9.8*  HCT 29.1*  PLT 230   ------------------------------------------------------------------------------------------------------------------ Chemistries   Recent Labs Lab 12/12/15 1812 12/13/15 0404  NA 144 146*  K 2.9* 3.1*  CL 108 110  CO2 30 30  GLUCOSE 144* 103*  BUN 32* 30*  CREATININE 1.44* 1.18  CALCIUM 8.2* 8.1*  MG 1.5*  --   AST 30  --   ALT 22  --   ALKPHOS 67  --   BILITOT 0.4  --    ------------------------------------------------------------------------------------------------------------------  Cardiac Enzymes  Recent Labs Lab 12/12/15 1812  TROPONINI 0.03*   ------------------------------------------------------------------------------------------------------------------  RADIOLOGY:  Ct Head Wo Contrast  Result Date: 12/12/2015 CLINICAL DATA:  Hypertension with altered mental status. EXAM: CT HEAD WITHOUT CONTRAST TECHNIQUE: Contiguous axial images were obtained from the base of the skull through the vertex without intravenous contrast. COMPARISON:  None. FINDINGS: The brain shows generalized atrophy. There are extensive chronic appearing small vessel ischemic changes affecting the pons, cerebellum, thalami and cerebral hemispheric white matter. No evidence of large vessel insult. No identifiable acute infarction.  No mass lesion, hemorrhage, hydrocephalus or extra-axial collection. No calvarial abnormality. Sinuses are clear. There is atherosclerotic calcification of the major vessels at the base of the brain.  IMPRESSION: No acute finding. Extensive chronic small vessel ischemic changes throughout the brain. Electronically Signed   By: Nelson Chimes M.D.   On: 12/12/2015 18:46   Dg Chest Portable 1 View  Result Date: 12/12/2015 CLINICAL DATA:  Acute onset of hypotension. Altered mental status. Initial encounter. EXAM: PORTABLE CHEST 1 VIEW COMPARISON:  Chest radiograph performed 12/08/2015, and CT of the chest performed 02/08/2004 FINDINGS: The lungs are well-aerated. Mild left basilar opacity may reflect the previously noted pleural calcification. There is no evidence of pleural effusion or pneumothorax. The cardiomediastinal silhouette is within normal limits. No acute osseous abnormalities are seen. IMPRESSION: Mild left basilar opacity may reflect previously noted pleural calcification. Lungs otherwise grossly clear. Electronically Signed   By: Garald Balding M.D.   On: 12/12/2015 18:47     ASSESSMENT AND PLAN:   This is a 82 y.o. male with a history of chronic kidney disease stage III, hypertension, chronic constipation, allergic rhinitis, recent hospitalization for acute kidney injury secondary to dehydration and bladder outlet obstruction now being admitted with:  # Acute encephalopathy due to UTI which is catheter related On aztreonam due to penicillin allergy. We'll wait for cultures. Likely discharge tomorrow but will need skilled nursing facility due to weakness.  # Hypokalemia - will replace PO & IV.   # Anemia - at baseline.  # Chronic constipation Continue bowel regimen  All the records are reviewed and case discussed with Care Management/Social Workerr. Management plans discussed with the patient, family and they are in agreement.  CODE STATUS: FULL CODE  DVT Prophylaxis: SCDs  TOTAL TIME TAKING CARE OF THIS PATIENT: 35 minutes.   POSSIBLE D/C IN 1-2 DAYS, DEPENDING ON CLINICAL CONDITION.  Hillary Bow R M.D on 12/13/2015 at 2:23 PM  Between 7am to 6pm - Pager -  [phone]  After 6pm go to [email or web address] - password [email or web address] [facility] [facility]  Office  [phone]  CC: Primary care physician; Loura Pardon, MD  Note: This dictation was prepared with Dragon dictation along with smaller phrase technology. Any transcriptional errors that result from this process are unintentional.

## 2015-12-13 NOTE — Clinical Social Work Note (Signed)
Clinical Social Work Assessment  Patient Details  Name: Matthew [name] MRN: [id number] Date of Birth: June 04, 1924  Date of referral:  12/13/15               Reason for consult:  Facility Placement, Other (Comment Required) (From [name])                Permission sought to share information with:  Chartered certified accountant granted to share information::  Yes, Verbal Permission Granted  Name::      Matthew [name]::     Relationship::     Contact Information:     Housing/Transportation Living arrangements for the past 2 months:  [place] of Information:  Patient, Spouse Patient Interpreter Needed:  None Criminal Activity/Legal Involvement Pertinent to Current Situation/Hospitalization:    Significant Relationships:  Neighbor, Other Family Members, Spouse, Friend Lives with:  Spouse Do you feel safe going back to the place where you live?  Yes Need for family participation in patient care:  Yes (Comment)  Care giving concerns:  Patient is a short term rehab resident at [name].    Social Worker assessment / plan:  Holiday representative (CSW) is familiar with patient from last admission. Patient was recently discharged from [facility] to [name] with [employer] authorization pending (used a 5 day LOG). Per [facility] admissions coordinator at [employer] authorization was received. Per [name] patient can return to [place] and he will start [name] authorization today. [name] also reported that patient can come on a 5 day LOG again if patient is medically stable before [name] authorization is received. CSW met with patient today to discuss D/C plan. Patient is agreeable to return to [name]. CSW contacted patient's wife Matthew [name] who is also in agreement with patient returning to [name]. Per wife she completed her third chemo treatment last week and she is feeling bad. Wife reported that  she has friends and family members that are checking on her. CSW provided emotional support.   FL2 complete and faxed out. CSW will continue to follow and assist as needed.   Employment status:  Retired Nurse, adult PT Recommendations:  [place] / Referral to community resources:  [place]  Patient/Family's Response to care:  Patient and wife are agreeable for patient to return to [name].   Patient/Family's Understanding of and Emotional Response to Diagnosis, Current Treatment, and Prognosis:  Patient and wife were very pleasant and thanked CSW for assistance. Wife verbalized that she is going through a lot with her husband and her cancer treatments. CSW provided support.   Emotional Assessment Appearance:  Appears stated age Attitude/Demeanor/Rapport:    Affect (typically observed):  Accepting, Adaptable, Pleasant Orientation:  Oriented to Self, Oriented to Place, Oriented to  Time, Oriented to Situation Alcohol / Substance use:  Not Applicable Psych involvement (Current and /or in the community):  No (Comment)  Discharge Needs  Concerns to be addressed:  Discharge Planning Concerns Readmission within the last 30 days:  Yes Current discharge risk:  Chronically ill, Dependent with Mobility Barriers to Discharge:  Continued Medical Work up   [employer], Veronia Beets, LCSW 12/13/2015, 2:41 PM

## 2015-12-13 NOTE — NC FL2 (Signed)
[place] LEVEL OF CARE SCREENING TOOL     IDENTIFICATION  Patient Name: Matthew [name] Birthdate: [birth year] Sex: male Admission Date (Current Location): 12/12/2015  [place] and [place] Number:  Engineering geologist and Address:  [facility], [address], [place], [place] [address]      Provider Number: [id number]  Attending Physician Name and Address:  Hillary Bow, MD  Relative Name and Phone Number:       Current Level of Care: Hospital Recommended Level of Care: [place] Prior Approval Number:    Date Approved/Denied:   PASRR Number:  ( [id number] )  Discharge Plan: SNF    Current Diagnoses: Patient Active Problem List   Diagnosis Date Noted  . Altered mental status 12/12/2015  . Acute renal failure (ARF) ([place]) 12/08/2015  . Hip fracture ([place]) 11/11/2015  . Encounter for Medicare annual wellness exam 07/09/2013  . Fatigue 07/09/2013  . Hx laparoscopic cholecystectomy 05/16/2013  . Prostate cancer screening 06/04/2011  . CONSTIPATION, CHRONIC 01/12/2010  . Nonspecific (abnormal) findings on radiological and other examination of body structure 10/07/2007  . ALLERGIC CONJUNCTIVITIS 10/04/2007  . EDEMA 10/04/2007  . HYPERCHOLESTEROLEMIA 08/10/2006  . RESTLESS LEG SYNDROME 08/10/2006  . Essential hypertension 08/10/2006  . ALLERGIC RHINITIS 08/10/2006  . Renal insufficiency 08/10/2006  . SKIN CANCER, HX OF 08/10/2006    Orientation RESPIRATION BLADDER Height & Weight     Self, Time, Situation, Place  Normal Continent Weight: 145 lb (65.8 kg) Height:  5\' 8"  (172.7 cm)  BEHAVIORAL SYMPTOMS/MOOD NEUROLOGICAL BOWEL NUTRITION STATUS   (none )  (none ) Incontinent Diet (Diet: Heart Healthy )  AMBULATORY STATUS COMMUNICATION OF NEEDS Skin   Extensive Assist Verbally Surgical wounds                       Personal Care Assistance Level of Assistance  Bathing, Feeding, Dressing Bathing Assistance:  Limited assistance Feeding assistance: Independent Dressing Assistance: Limited assistance     Functional Limitations Info  Sight, Hearing, Speech Sight Info: Adequate Hearing Info: Adequate Speech Info: Adequate    SPECIAL CARE FACTORS FREQUENCY  PT (By licensed PT), OT (By licensed OT)     PT Frequency:  (5) OT Frequency:  (5)            Contractures      Additional Factors Info  Code Status, Allergies Code Status Info:  (Full Code. ) Allergies Info:  (Chlorthalidone, Codeine, Iodine, Penicillins, Ramipril, Ropinirole, Hydrochloride)           Current Medications (12/13/2015):  This is the current hospital active medication list Current Facility-Administered Medications  Medication Dose Route Frequency Provider Last Rate Last Dose  . 0.9 %  sodium chloride infusion   Intravenous Continuous Alexis Hugelmeyer, DO 75 mL/hr at 12/12/15 2226    . acetaminophen (TYLENOL) tablet 650 mg  650 mg Oral Q6H PRN Alexis Hugelmeyer, DO       Or  . acetaminophen (TYLENOL) suppository 650 mg  650 mg Rectal Q6H PRN Alexis Hugelmeyer, DO      . amLODipine (NORVASC) tablet 10 mg  10 mg Oral BH-q7a Alexis Hugelmeyer, DO      . aspirin chewable tablet 81 mg  81 mg Oral Daily Alexis Hugelmeyer, DO      . atorvastatin (LIPITOR) tablet 20 mg  20 mg Oral QHS Alexis Hugelmeyer, DO   20 mg at 12/12/15 2227  . aztreonam (AZACTAM) 500 mg in dextrose  5 % 50 mL IVPB  500 mg Intravenous [address] [place], RPH   500 mg at 12/13/15 0534  . bisacodyl (DULCOLAX) suppository 10 mg  10 mg Rectal Daily PRN Alexis Hugelmeyer, DO      . docusate sodium (COLACE) capsule 100 mg  100 mg Oral BID Alexis Hugelmeyer, DO   100 mg at 12/12/15 2226  . enoxaparin (LOVENOX) injection 40 mg  40 mg Subcutaneous Q24H Alexis Hugelmeyer, DO   40 mg at 12/12/15 2227  . feeding supplement (PRO-STAT SUGAR FREE 64) liquid 30 mL  30 mL Oral Daily Alexis Hugelmeyer, DO      . HYDROcodone-acetaminophen (NORCO/VICODIN) 5-325 MG per  tablet 1-2 tablet  1-2 tablet Oral Q4H PRN Alexis Hugelmeyer, DO      . mirtazapine (REMERON) tablet 15 mg  15 mg Oral QHS Alexis Hugelmeyer, DO   15 mg at 12/12/15 2226  . ondansetron (ZOFRAN) tablet 4 mg  4 mg Oral Q6H PRN Alexis Hugelmeyer, DO       Or  . ondansetron (ZOFRAN) injection 4 mg  4 mg Intravenous Q6H PRN Alexis Hugelmeyer, DO      . oxyCODONE (Oxy IR/ROXICODONE) immediate release tablet 5 mg  5 mg Oral Q3H PRN Alexis Hugelmeyer, DO      . pantoprazole (PROTONIX) EC tablet 40 mg  40 mg Oral BH-q7a Alexis Hugelmeyer, DO      . polyethylene glycol (MIRALAX / GLYCOLAX) packet 17 g  17 g Oral Daily PRN Srikar Sudini, MD      . potassium chloride (KLOR-CON) packet 40 mEq  40 mEq Oral BID Alexis Hugelmeyer, DO   40 mEq at 12/12/15 2227  . senna-docusate (Senokot-S) tablet 2 tablet  2 tablet Oral BID Srikar Sudini, MD      . sodium phosphate (FLEET) 7-19 GM/118ML enema 1 enema  1 enema Rectal Once PRN Alexis Hugelmeyer, DO      . tamsulosin (FLOMAX) capsule 0.4 mg  0.4 mg Oral Daily Alexis Hugelmeyer, DO         Discharge Medications: Please see discharge summary for a list of discharge medications.  Relevant Imaging Results:  Relevant Lab Results:   Additional Information  (SSN: [id number])  Khasir Woodrome, Veronia Beets, LCSW

## 2015-12-13 NOTE — Progress Notes (Signed)
Verbal order received from MD. Sudini to d/c cardiac monitoring.

## 2015-12-13 NOTE — Evaluation (Signed)
Physical Therapy Evaluation Patient Details Name: Matthew [name] MRN: [id number] DOB: Aug 15, 1924 Today's Date: 12/13/2015   History of Present Illness  presented to ER from STR secondary to AMS, weakness and decreased PO intake; admitted for management of UTI.  Of note, patient PMH significant for L hip hemiarthroplasty 11/11/15, posterior THPs, WBAT; has been in STR since surgery.  Clinical Impression  Upon evaluation, patient alert and oriented to basic information; follows commands and demonstrates fair insight.  Patient generally weak and deconditioned due to recent surgery and recurrent hospitalizations.  Currently requiring mod/max assis t+2 for bed mobility; min/mod assist +2 for sit/stand, basic transfers and very short-distance gait (4-5 steps) with RW.  Gait performance noted with very short, shuffling steps, narrowed BOS and heavy posterior weight shift.  Poor standing balance; high fall risk.  Poor awareness of THPs, requiring constant cuing/assist from therapist for recall and adherence with functional activities. Additional gait/mobility deferred at this time, as breakfast arrived and patient wishing to eat. Would benefit from skilled PT to address above deficits and promote optimal return to PLOF; recommend transition to STR upon discharge from acute hospitalization.     Follow Up Recommendations SNF    Equipment Recommendations       Recommendations for Other Services       Precautions / Restrictions Precautions Precautions: Fall;Posterior Hip Restrictions Weight Bearing Restrictions: Yes LLE Weight Bearing: Weight bearing as tolerated      Mobility  Bed Mobility Overal bed mobility: Needs Assistance Bed Mobility: Supine to Sit     Supine to sit: Mod assist;+2 for physical assistance     General bed mobility comments: Pt requires heavy cues and direction for bed mobility. Heavy assist from therapist due to weakness and poor sequencing. Cues to avoid violating  posterior hip precautions  Transfers Overall transfer level: Needs assistance Equipment used: Rolling walker (2 wheeled) Transfers: Sit to/from Stand Sit to Stand: Min assist;Mod assist;+2 physical assistance         General transfer comment: Pt with poor anterior weight shifting. Leans posteriorly and comes up slowly indicating decreased LE strength/power  Ambulation/Gait Ambulation/Gait assistance: Min assist;Mod assist;+2 physical assistance Ambulation Distance (Feet): 5 Feet Assistive device: Rolling walker (2 wheeled)       General Gait Details: very short, shuffling steps (with limited correction despite verbal cuing); posterior weight shift throughout gait cycle  Stairs            Wheelchair Mobility    Modified Rankin (Stroke Patients Only)       Balance Overall balance assessment: Needs assistance Sitting-balance support: No upper extremity supported;Feet supported Sitting balance-Leahy Scale: Fair     Standing balance support: Bilateral upper extremity supported Standing balance-Leahy Scale: Poor                               Pertinent Vitals/Pain Pain Assessment: Faces Faces Pain Scale: Hurts a little bit Pain Location: L hip Pain Descriptors / Indicators: Aching;Grimacing Pain Intervention(s): Limited activity within patient's tolerance;Monitored during session;Repositioned    Home Living Family/patient expects to be discharged to:: Skilled nursing facility                 Additional Comments: When pt returns home he has 7 steps to enter with L rail on the front and 2 steps with no rails.  Pt was not previously using any assitive device to ambulate.    Prior Function  Comments: At true baseline, pt is able to drive, do yard work, light activities in the home. Not previously wearing O2     Hand Dominance        Extremity/Trunk Assessment   Upper Extremity Assessment: Overall WFL for tasks assessed            Lower Extremity Assessment: Generalized weakness (globally 3-/5 throughout, very limited active ROM due to weakness/fatigue)         Communication   Communication: No difficulties  Cognition Arousal/Alertness: Awake/alert Behavior During Therapy: WFL for tasks assessed/performed Overall Cognitive Status: Within Functional Limits for tasks assessed                      General Comments      Exercises        Assessment/Plan    PT Assessment Patient needs continued PT services  PT Diagnosis Difficulty walking;Generalized weakness   PT Problem List Decreased strength;Decreased range of motion;Decreased activity tolerance;Decreased balance;Decreased mobility;Pain  PT Treatment Interventions DME instruction;Gait training;Stair training;Functional mobility training;Therapeutic activities;Therapeutic exercise;Balance training;Patient/family education   PT Goals (Current goals can be found in the Care Plan section) Acute Rehab PT Goals Patient Stated Goal: Return to SNF to complete rehab PT Goal Formulation: With patient Time For Goal Achievement: 12/27/15 Potential to Achieve Goals: Fair    Frequency 7X/week   Barriers to discharge Decreased caregiver support      Co-evaluation               End of Session Equipment Utilized During Treatment: Gait belt Activity Tolerance: Patient tolerated treatment well Patient left: in chair;with call bell/phone within reach;with chair alarm set;with nursing/sitter in room Nurse Communication: Mobility status         Time: [id number] PT Time Calculation (min) (ACUTE ONLY): 21 min   Charges:   PT Evaluation $PT Eval Low Complexity: 1 Procedure     PT G Codes:        Machell Wirthlin H. Owens Shark, PT, DPT, NCS 12/13/15, 10:26 AM [phone]

## 2015-12-14 LAB — BASIC METABOLIC PANEL
Anion gap: 8 (ref 5–15)
BUN: 20 mg/dL (ref 6–20)
CHLORIDE: 112 mmol/L — AB (ref 101–111)
CO2: 25 mmol/L (ref 22–32)
CREATININE: 0.99 mg/dL (ref 0.61–1.24)
Calcium: 8.2 mg/dL — ABNORMAL LOW (ref 8.9–10.3)
GFR calc Af Amer: >60 mL/min (ref >60)
GFR calc non Af Amer: >60 mL/min (ref >60)
GLUCOSE: 86 mg/dL (ref 65–99)
Potassium: 3.2 mmol/L — ABNORMAL LOW (ref 3.5–5.1)
Sodium: 145 mmol/L (ref 135–145)

## 2015-12-14 LAB — MAGNESIUM: Magnesium: 2 mg/dL (ref 1.7–2.4)

## 2015-12-14 MED ORDER — OXYCODONE HCL 5 MG PO TABS
5.0000 mg | ORAL_TABLET | ORAL | Status: DC | PRN
  Administered 2015-12-14 – 2015-12-15 (×2): 5 mg via ORAL
  Filled 2015-12-14 (×2): qty 1

## 2015-12-14 MED ORDER — POTASSIUM CHLORIDE CRYS ER 20 MEQ PO TBCR
40.0000 meq | EXTENDED_RELEASE_TABLET | Freq: Once | ORAL | Status: AC
  Administered 2015-12-14: 40 meq via ORAL
  Filled 2015-12-14: qty 2

## 2015-12-14 NOTE — Progress Notes (Signed)
Patient agitated in chair trying to get up without assist. Patient transferred back to bed. Patient asking where his kids are, and looking for medicines in his desk drawer. Patient reoriented. Bed alarm set. Continue to monitor.

## 2015-12-14 NOTE — Progress Notes (Signed)
Patient resting in bed. Bed alarm on. IVF infusing. Foley intact and patent. Will check back in on patient within the hour.

## 2015-12-14 NOTE — Plan of Care (Signed)
Problem: Education: Goal: Knowledge of [facility] Education information/materials will improve Outcome: Progressing Patient educated but needs reinforcement.

## 2015-12-14 NOTE — Progress Notes (Signed)
Physical Therapy Treatment Patient Details Name: Matthew [name] MRN: [id number] DOB: [birth year] Today's Date: 12/14/2015    History of Present Illness presented to ER from STR secondary to AMS, weakness and decreased PO intake; admitted for management of UTI.  Of note, patient PMH significant for L hip hemiarthroplasty 11/11/15, posterior THPs, WBAT; has been in STR since surgery.    PT Comments    Progressive increase in gait distance; constant cuing for step length, width and postural extension.  Absent awareness of THPs with all functional activities.  Poor standing balance requiring hands-on assist at all times.  Follow Up Recommendations  SNF     Equipment Recommendations       Recommendations for Other Services       Precautions / Restrictions Precautions Precautions: Fall;Posterior Hip Restrictions Weight Bearing Restrictions: Yes LLE Weight Bearing: Weight bearing as tolerated    Mobility  Bed Mobility Overal bed mobility: Needs Assistance Bed Mobility: Supine to Sit     Supine to sit: Mod assist;Max assist     General bed mobility comments: Pt requires heavy cues and direction for bed mobility. Heavy assist from therapist due to weakness and poor sequencing. Cues to avoid violating posterior hip precautions  Transfers Overall transfer level: Needs assistance Equipment used: Rolling walker (2 wheeled) Transfers: Sit to/from Stand Sit to Stand: Mod assist;+2 safety/equipment            Ambulation/Gait Ambulation/Gait assistance: Mod assist;+2 safety/equipment Ambulation Distance (Feet): 15 Feet (second trial, 25') Assistive device: Rolling walker (2 wheeled)       General Gait Details: very short, shuffling steps--improved temporarily with verbal cuing from therapist.  Very narrowed BOS with L LE toe-in throughout gait cycle   Stairs            Wheelchair Mobility    Modified Rankin (Stroke Patients Only)       Balance Overall  balance assessment: Needs assistance Sitting-balance support: No upper extremity supported;Feet supported Sitting balance-Leahy Scale: Fair     Standing balance support: Bilateral upper extremity supported Standing balance-Leahy Scale: Poor                      Cognition Arousal/Alertness: Awake/alert Behavior During Therapy: WFL for tasks assessed/performed Overall Cognitive Status: Difficult to assess (speech slightly garbled at times)                      Exercises Other Exercises Other Exercises: Sit/stand x4 with RW, min/mod assist +2 for lift off, standing balance. Increased time/effort for anteiror weight translation to move COG over BOS (often requiring patient to step backwards to achieve)    General Comments        Pertinent Vitals/Pain Pain Assessment: Faces Faces Pain Scale: No hurt    Home Living                      Prior Function            PT Goals (current goals can now be found in the care plan section) Acute Rehab PT Goals Patient Stated Goal: Return to SNF to complete rehab PT Goal Formulation: With patient Time For Goal Achievement: 12/27/15 Potential to Achieve Goals: Fair Progress towards PT goals: Progressing toward goals    Frequency  7X/week    PT Plan Current plan remains appropriate    Co-evaluation             End of Session Equipment  Utilized During Treatment: Gait belt Activity Tolerance: Patient tolerated treatment well Patient left: in chair;with call bell/phone within reach;with chair alarm set     Time: [id number] PT Time Calculation (min) (ACUTE ONLY): 23 min  Charges:  $Gait Training: 8-22 mins $Therapeutic Activity: 8-22 mins                    G Codes:      Chanelle Hodsdon H. Owens Shark, PT, DPT, NCS 12/14/15, 1:55 PM [phone]

## 2015-12-14 NOTE — Progress Notes (Signed)
[facility] NAME: Taro Rawlinson    MR#:  [id number]  DATE OF BIRTH:  November 07, 1924  SUBJECTIVE:  CHIEF COMPLAINT:   Chief Complaint  Patient presents with  . Altered Mental Status   Confused and restless. Patient has no complaint.  REVIEW OF SYSTEMS:    Review of Systems  Unable to perform ROS: Mental status change    DRUG ALLERGIES:   Allergies  Allergen Reactions  . Chlorthalidone     REACTION: frequent urination  . Codeine     REACTION: nausea  . Iodine     REACTION: caused bubble to come up in eye  . Penicillins Other (See Comments)    REACTION: GI Has patient had a PCN reaction causing immediate rash, facial/tongue/throat swelling, SOB or lightheadedness with hypotension: No Has patient had a PCN reaction causing severe rash involving mucus membranes or skin necrosis: No Has patient had a PCN reaction that required hospitalization: pt not sure Has patient had a PCN reaction occurring within the last 10 years: pt not sure If all of the above answers are "NO", then may proceed with Cephalosporin use.  . Ramipril     REACTION: cough  . Ropinirole Hydrochloride     REACTION: insomnia    VITALS:  Blood pressure 137/90, pulse 70, temperature 97.5 F (36.4 C), temperature source Oral, resp. rate 18, height 5\' 8"  (1.727 m), weight 145 lb (65.8 kg), SpO2 98 %.  PHYSICAL EXAMINATION:   Physical Exam  GENERAL:  82 y.o.-year-old patient lying in the bed with no acute distress.  EYES: Pupils equal, round, reactive to light and accommodation. No scleral icterus. Extraocular muscles intact.  HEENT: Head atraumatic, normocephalic. Oropharynx and nasopharynx clear.  NECK:  Supple, no jugular venous distention. No thyroid enlargement, no tenderness.  LUNGS: Normal breath sounds bilaterally, no wheezing, rales, rhonchi. No use of accessory muscles of respiration.  CARDIOVASCULAR: S1, S2 normal. No murmurs, rubs, or  gallops.  ABDOMEN: Soft, nontender, nondistended. Bowel sounds present. No organomegaly or mass. Foley catheter in place EXTREMITIES: No cyanosis, clubbing or edema b/l.    NEUROLOGIC: Cranial nerves II through XII are intact. No focal Motor or sensory deficits b/l.   PSYCHIATRIC: The patient is alert and awake, confused SKIN: No obvious rash, lesion, or ulcer.   LABORATORY PANEL:   CBC  Recent Labs Lab 12/13/15 0404  WBC 9.2  HGB 9.8*  HCT 29.1*  PLT 230   ------------------------------------------------------------------------------------------------------------------ Chemistries   Recent Labs Lab 12/12/15 1812  12/14/15 0425  NA 144  < > 145  K 2.9*  < > 3.2*  CL 108  < > 112*  CO2 30  < > 25  GLUCOSE 144*  < > 86  BUN 32*  < > 20  CREATININE 1.44*  < > 0.99  CALCIUM 8.2*  < > 8.2*  MG 1.5*  --  2.0  AST 30  --   --   ALT 22  --   --   ALKPHOS 67  --   --   BILITOT 0.4  --   --   < > = values in this interval not displayed. ------------------------------------------------------------------------------------------------------------------  Cardiac Enzymes  Recent Labs Lab 12/12/15 1812  TROPONINI 0.03*   ------------------------------------------------------------------------------------------------------------------  RADIOLOGY:  Ct Head Wo Contrast  Result Date: 12/12/2015 CLINICAL DATA:  Hypertension with altered mental status. EXAM: CT HEAD WITHOUT CONTRAST TECHNIQUE: Contiguous axial images were obtained from the base of the skull  through the vertex without intravenous contrast. COMPARISON:  None. FINDINGS: The brain shows generalized atrophy. There are extensive chronic appearing small vessel ischemic changes affecting the pons, cerebellum, thalami and cerebral hemispheric white matter. No evidence of large vessel insult. No identifiable acute infarction. No mass lesion, hemorrhage, hydrocephalus or extra-axial collection. No calvarial abnormality. Sinuses  are clear. There is atherosclerotic calcification of the major vessels at the base of the brain. IMPRESSION: No acute finding. Extensive chronic small vessel ischemic changes throughout the brain. Electronically Signed   By: Nelson Chimes M.D.   On: 12/12/2015 18:46   Dg Chest Portable 1 View  Result Date: 12/12/2015 CLINICAL DATA:  Acute onset of hypotension. Altered mental status. Initial encounter. EXAM: PORTABLE CHEST 1 VIEW COMPARISON:  Chest radiograph performed 12/08/2015, and CT of the chest performed 02/08/2004 FINDINGS: The lungs are well-aerated. Mild left basilar opacity may reflect the previously noted pleural calcification. There is no evidence of pleural effusion or pneumothorax. The cardiomediastinal silhouette is within normal limits. No acute osseous abnormalities are seen. IMPRESSION: Mild left basilar opacity may reflect previously noted pleural calcification. Lungs otherwise grossly clear. Electronically Signed   By: Garald Balding M.D.   On: 12/12/2015 18:47     ASSESSMENT AND PLAN:   This is a 82 y.o. male with a history of chronic kidney disease stage III, hypertension, chronic constipation, allergic rhinitis, recent hospitalization for acute kidney injury secondary to dehydration and bladder outlet obstruction now being admitted with:  # Acute encephalopathy due to UTI which is catheter related On aztreonam due to penicillin allergy. Now on rocephin iv. F/u urine cultures. Likely discharge tomorrow but will need skilled nursing facility due to weakness.  # Hypokalemia - replaced, f/u K. Hypomagnesemia. Improved with mag.   # Anemia - at baseline.  # Chronic constipation Continue bowel regimen  All the records are reviewed and case discussed with Care Management/Social Workerr. Management plans discussed with the patient, family and they are in agreement.  CODE STATUS: FULL CODE  DVT Prophylaxis: SCDs  TOTAL TIME TAKING CARE OF THIS PATIENT: 35 minutes.    POSSIBLE D/C IN 1-2 DAYS, DEPENDING ON CLINICAL CONDITION.  Demetrios Loll M.D on 12/14/2015 at 4:59 PM  Between 7am to 6pm - Pager - [phone]  After 6pm go to [email or web address] - password [email or web address] [facility]  Office  [phone]  CC: Primary care physician; Loura Pardon, MD  Note: This dictation was prepared with Dragon dictation along with smaller phrase technology. Any transcriptional errors that result from this process are unintentional.

## 2015-12-15 DIAGNOSIS — Z7982 Long term (current) use of aspirin: Secondary | ICD-10-CM | POA: Diagnosis not present

## 2015-12-15 DIAGNOSIS — I129 Hypertensive chronic kidney disease with stage 1 through stage 4 chronic kidney disease, or unspecified chronic kidney disease: Secondary | ICD-10-CM | POA: Diagnosis not present

## 2015-12-15 DIAGNOSIS — Z85828 Personal history of other malignant neoplasm of skin: Secondary | ICD-10-CM | POA: Diagnosis not present

## 2015-12-15 DIAGNOSIS — E869 Volume depletion, unspecified: Secondary | ICD-10-CM | POA: Diagnosis not present

## 2015-12-15 DIAGNOSIS — N390 Urinary tract infection, site not specified: Secondary | ICD-10-CM | POA: Diagnosis not present

## 2015-12-15 DIAGNOSIS — R339 Retention of urine, unspecified: Secondary | ICD-10-CM | POA: Diagnosis not present

## 2015-12-15 DIAGNOSIS — R7989 Other specified abnormal findings of blood chemistry: Secondary | ICD-10-CM | POA: Diagnosis not present

## 2015-12-15 DIAGNOSIS — Z87891 Personal history of nicotine dependence: Secondary | ICD-10-CM | POA: Diagnosis not present

## 2015-12-15 DIAGNOSIS — R4182 Altered mental status, unspecified: Secondary | ICD-10-CM | POA: Diagnosis not present

## 2015-12-15 DIAGNOSIS — R031 Nonspecific low blood-pressure reading: Secondary | ICD-10-CM | POA: Diagnosis not present

## 2015-12-15 DIAGNOSIS — S72002D Fracture of unspecified part of neck of left femur, subsequent encounter for closed fracture with routine healing: Secondary | ICD-10-CM | POA: Diagnosis not present

## 2015-12-15 DIAGNOSIS — R627 Adult failure to thrive: Secondary | ICD-10-CM | POA: Diagnosis not present

## 2015-12-15 DIAGNOSIS — Z7401 Bed confinement status: Secondary | ICD-10-CM | POA: Diagnosis not present

## 2015-12-15 DIAGNOSIS — I959 Hypotension, unspecified: Secondary | ICD-10-CM | POA: Diagnosis not present

## 2015-12-15 DIAGNOSIS — E784 Other hyperlipidemia: Secondary | ICD-10-CM | POA: Diagnosis not present

## 2015-12-15 DIAGNOSIS — G9340 Encephalopathy, unspecified: Secondary | ICD-10-CM | POA: Diagnosis not present

## 2015-12-15 DIAGNOSIS — Z9981 Dependence on supplemental oxygen: Secondary | ICD-10-CM | POA: Diagnosis not present

## 2015-12-15 DIAGNOSIS — N183 Chronic kidney disease, stage 3 (moderate): Secondary | ICD-10-CM | POA: Diagnosis not present

## 2015-12-15 DIAGNOSIS — B952 Enterococcus as the cause of diseases classified elsewhere: Secondary | ICD-10-CM | POA: Diagnosis not present

## 2015-12-15 DIAGNOSIS — I10 Essential (primary) hypertension: Secondary | ICD-10-CM | POA: Diagnosis not present

## 2015-12-15 DIAGNOSIS — T83511A Infection and inflammatory reaction due to indwelling urethral catheter, initial encounter: Secondary | ICD-10-CM | POA: Diagnosis not present

## 2015-12-15 DIAGNOSIS — Z79899 Other long term (current) drug therapy: Secondary | ICD-10-CM | POA: Diagnosis not present

## 2015-12-15 DIAGNOSIS — Z96642 Presence of left artificial hip joint: Secondary | ICD-10-CM | POA: Diagnosis not present

## 2015-12-15 DIAGNOSIS — Z23 Encounter for immunization: Secondary | ICD-10-CM | POA: Diagnosis not present

## 2015-12-15 DIAGNOSIS — W19XXXD Unspecified fall, subsequent encounter: Secondary | ICD-10-CM | POA: Diagnosis not present

## 2015-12-15 LAB — BASIC METABOLIC PANEL
Anion gap: 6 (ref 5–15)
BUN: 16 mg/dL (ref 6–20)
CALCIUM: 8.2 mg/dL — AB (ref 8.9–10.3)
CO2: 24 mmol/L (ref 22–32)
CREATININE: 0.95 mg/dL (ref 0.61–1.24)
Chloride: 113 mmol/L — ABNORMAL HIGH (ref 101–111)
Glucose, Bld: 84 mg/dL (ref 65–99)
Potassium: 3.8 mmol/L (ref 3.5–5.1)
SODIUM: 143 mmol/L (ref 135–145)

## 2015-12-15 LAB — URINE CULTURE

## 2015-12-15 MED ORDER — LEVOFLOXACIN 250 MG PO TABS
250.0000 mg | ORAL_TABLET | Freq: Every day | ORAL | Status: DC
  Administered 2015-12-15: 250 mg via ORAL
  Filled 2015-12-15: qty 1

## 2015-12-15 MED ORDER — LEVOFLOXACIN 250 MG PO TABS: 250.0000 mg | ORAL_TABLET | Freq: Every day | ORAL | 0 refills | Status: DC

## 2015-12-15 NOTE — Discharge Instructions (Signed)
Heart healthy diet. Activity as tolerated. Fall and aspiration precaution.

## 2015-12-15 NOTE — Progress Notes (Signed)
Patient is medically stable for D/C back to [name] today. Per [name] admissions coordinator at [name] patient can return today, [name] authorization is still pending and he will accept a 5 day LOG. Clinical Education officer, museum (CSW) Surveyor, quantity approved 5 day LOG. CSW sent D/C orders to [name] via [facility]. Patient is aware of above. CSW contacted patient's wife [name] and left her a voicemail. Please reconsult if future social work needs arise. CSW signing off.   [employer], LCSW [phone]

## 2015-12-15 NOTE — Progress Notes (Signed)
Attempted to call report to [facility] 3 times, on hold each time for 8-12 minutes.

## 2015-12-15 NOTE — Discharge Summary (Addendum)
[name] NAME: Matthew [name]    MR#:  [id number]  DATE OF BIRTH:  [birth year]  DATE OF ADMISSION:  12/12/2015   ADMITTING PHYSICIAN: Matthew Lighter, MD  DATE OF DISCHARGE: 12/15/2015  PRIMARY CARE PHYSICIAN: Matthew Pardon, MD   ADMISSION DIAGNOSIS:  Somnolence [R40.0] Acute urinary tract infection [N39.0] Urinary tract infection associated with catheterization of urinary tract, unspecified indwelling urinary catheter type, initial encounter ([place]) [T83.511A, N39.0] DISCHARGE DIAGNOSIS:  Active Problems:   Altered mental status UTI. SECONDARY DIAGNOSIS:   Past Medical History:  Diagnosis Date  . Allergic rhinitis   . Chronic constipation   . Chronic kidney disease    stage 3, [place] benign appearing cysts 05/2006  . History of CT scan of chest 6/09   calcification consistent with asbestos exp, also pleural nodule, incidental gallstones  . History of transfusion of whole blood   . Hypertension   . Melanoma ([place])    history of, by right eye  . Pulmonary nodules    being obs with serial CT, 6 month f/u stable 10/05  . Renal insufficiency    HOSPITAL COURSE:  This is a 82 y.o.malewith a history of chronic kidney disease stage III, hypertension, chronic constipation, allergic rhinitis, recent hospitalization for acute kidney injury secondary to dehydration and bladder outlet obstructionnow being admitted with:  # Acute encephalopathy due to UTI which is catheter related He was onn aztreonam due to penicillin allergy. Now on rocephin iv. Urine cultures: ENTEROCOCCUS SPECIES, sensitive to levaquin and ampicillin . Change to po levaquin.  # Hypokalemia - replaced, improved. Hypomagnesemia. Improved with mag.   # Anemia - at baseline.  # Chronic constipation Continue bowel regimen   DISCHARGE CONDITIONS:  Stable, discharge to SNF today. CONSULTS OBTAINED:   DRUG ALLERGIES:   Allergies  Allergen Reactions    . Chlorthalidone     REACTION: frequent urination  . Codeine     REACTION: nausea  . Iodine     REACTION: caused bubble to come up in eye  . Penicillins Other (See Comments)    REACTION: GI Has patient had a PCN reaction causing immediate rash, facial/tongue/throat swelling, SOB or lightheadedness with hypotension: No Has patient had a PCN reaction causing severe rash involving mucus membranes or skin necrosis: No Has patient had a PCN reaction that required hospitalization: pt not sure Has patient had a PCN reaction occurring within the last 10 years: pt not sure If all of the above answers are "NO", then may proceed with Cephalosporin use.  . Ramipril     REACTION: cough  . Ropinirole Hydrochloride     REACTION: insomnia   DISCHARGE MEDICATIONS:     Medication List    STOP taking these medications   oxyCODONE 5 MG immediate release tablet Commonly known as:  Oxy IR/ROXICODONE     TAKE these medications   amLODipine 10 MG tablet Commonly known as:  NORVASC Take 10 mg by mouth every morning.   aspirin 81 MG tablet Take 81 mg by mouth daily.   atorvastatin 20 MG tablet Commonly known as:  LIPITOR Take 20 mg by mouth at bedtime.   docusate sodium 100 MG capsule Commonly known as:  COLACE Take 1 capsule (100 mg total) by mouth 2 (two) times daily.   feeding supplement (ENSURE ENLIVE) Liqd Take 237 mLs by mouth 2 (two) times daily between meals.   feeding supplement (PRO-STAT SUGAR FREE 64) Liqd Take 30 mLs by  mouth daily.   levofloxacin 250 MG tablet Commonly known as:  LEVAQUIN Take 1 tablet (250 mg total) by mouth daily.   mirtazapine 15 MG tablet Commonly known as:  REMERON Take 1 tablet (15 mg total) by mouth at bedtime.   pantoprazole 40 MG tablet Commonly known as:  PROTONIX Take 1 tablet (40 mg total) by mouth daily. What changed:  when to take this   tamsulosin 0.4 MG Caps capsule Commonly known as:  FLOMAX Take 1 capsule (0.4 mg total) by mouth  daily.   TUBERSOL 5 UNIT/0.1ML injection Generic drug:  tuberculin Inject 5 Units into the skin See admin instructions. Inject 0.1 ml (5 units) intradermally every evening shift every 14 days for ppd until 12/25/15.   UNABLE TO FIND See admin instructions. Medpass 2.0  Use as directed 2 times a day for supplement.        DISCHARGE INSTRUCTIONS:   DIET:  Heart healthy diet. DISCHARGE CONDITION:  Stable. ACTIVITY:  As tolerated. DISCHARGE LOCATION:    If you experience worsening of your admission symptoms, develop shortness of breath, life threatening emergency, suicidal or homicidal thoughts you must seek medical attention immediately by calling 911 or calling your MD immediately  if symptoms less severe.  You Must read complete instructions/literature along with all the possible adverse reactions/side effects for all the Medicines you take and that have been prescribed to you. Take any new Medicines after you have completely understood and accpet all the possible adverse reactions/side effects.   Please note  You were cared for by a hospitalist during your hospital stay. If you have any questions about your discharge medications or the care you received while you were in the hospital after you are discharged, you can call the unit and asked to speak with the hospitalist on call if the hospitalist that took care of you is not available. Once you are discharged, your primary care physician will handle any further medical issues. Please note that NO REFILLS for any discharge medications will be authorized once you are discharged, as it is imperative that you return to your primary care physician (or establish a relationship with a primary care physician if you do not have one) for your aftercare needs so that they can reassess your need for medications and monitor your lab values.    On the day of Discharge:  VITAL SIGNS:  Blood pressure 133/61, pulse 83, temperature 98.4 F (36.9  C), temperature source Oral, resp. rate 18, height 5\' 8"  (1.727 m), weight 145 lb (65.8 kg), SpO2 97 %. PHYSICAL EXAMINATION:  GENERAL:  82 y.o.-year-old patient lying in the bed with no acute distress.  EYES: No scleral icterus. Extraocular muscles intact.  HEENT: Head atraumatic, normocephalic. NECK:  Supple, no jugular venous distention. No thyroid enlargement, no tenderness.  LUNGS: Normal breath sounds bilaterally, no wheezing, rales,rhonchi or crepitation. No use of accessory muscles of respiration.  CARDIOVASCULAR: S1, S2 normal. No murmurs, rubs, or gallops.  ABDOMEN: Soft, non-tender, non-distended. Bowel sounds present. No organomegaly or mass.  EXTREMITIES: No pedal edema, cyanosis, or clubbing.  NEUROLOGIC: not follow commands. PSYCHIATRIC: The patient is confused.  SKIN: No obvious rash, lesion, or ulcer.  DATA REVIEW:   CBC  Recent Labs Lab 12/13/15 0404  WBC 9.2  HGB 9.8*  HCT 29.1*  PLT 230    Chemistries   Recent Labs Lab 12/12/15 1812  12/14/15 0425 12/15/15 0646  NA 144  < > 145 143  K 2.9*  < >  3.2* 3.8  CL 108  < > 112* 113*  CO2 30  < > 25 24  GLUCOSE 144*  < > 86 84  BUN 32*  < > 20 16  CREATININE 1.44*  < > 0.99 0.95  CALCIUM 8.2*  < > 8.2* 8.2*  MG 1.5*  --  2.0  --   AST 30  --   --   --   ALT 22  --   --   --   ALKPHOS 67  --   --   --   BILITOT 0.4  --   --   --   < > = values in this interval not displayed.   Microbiology Results  Results for orders placed or performed during the hospital encounter of 12/12/15  Urine culture     Status: Abnormal   Collection Time: 12/12/15  6:46 PM  Result Value Ref Range Status   Specimen Description URINE, CATHETERIZED  Final   Special Requests NONE  Final   Culture 20,000 COLONIES/mL ENTEROCOCCUS SPECIES (A)  Final   Report Status 12/15/2015 FINAL  Final   Organism ID, Bacteria ENTEROCOCCUS SPECIES (A)  Final      Susceptibility   Enterococcus species - MIC*    AMPICILLIN <=2 SENSITIVE  Sensitive     LEVOFLOXACIN 0.5 SENSITIVE Sensitive     NITROFURANTOIN <=16 SENSITIVE Sensitive     VANCOMYCIN 1 SENSITIVE Sensitive     * 20,000 COLONIES/mL ENTEROCOCCUS SPECIES    RADIOLOGY:  No results found.   Management plans discussed with the patient, family and they are in agreement.  CODE STATUS:     Code Status Orders        Start     Ordered   12/12/15 2151  Full code  Continuous     12/12/15 2150    Code Status History    Date Active Date Inactive Code Status Order ID Comments User Context   12/08/2015  6:12 AM 12/10/2015  4:46 PM Full Code [id number]  Gentry Fitz, RN Inpatient   11/11/2015  1:52 PM 11/14/2015  4:31 PM Full Code [id number]  Fritzi Mandes, MD Inpatient      TOTAL TIME TAKING CARE OF THIS PATIENT: 33 minutes.    Demetrios Loll M.D on 12/15/2015 at 11:51 AM  Between 7am to 6pm - Pager - [phone]  After 6pm go to [email or web address] - Proofreader  [facility]  Office  [phone]  CC: Primary care physician; Matthew Pardon, MD   Note: This dictation was prepared with Dragon dictation along with smaller phrase technology. Any transcriptional errors that result from this process are unintentional.

## 2015-12-15 NOTE — Progress Notes (Signed)
EMS called, waiting on transportation.

## 2015-12-16 ENCOUNTER — Telehealth: Payer: Self-pay | Admitting: Family Medicine

## 2015-12-16 DIAGNOSIS — G9340 Encephalopathy, unspecified: Secondary | ICD-10-CM | POA: Diagnosis not present

## 2015-12-16 DIAGNOSIS — R339 Retention of urine, unspecified: Secondary | ICD-10-CM | POA: Diagnosis not present

## 2015-12-16 DIAGNOSIS — I10 Essential (primary) hypertension: Secondary | ICD-10-CM | POA: Diagnosis not present

## 2015-12-16 DIAGNOSIS — E784 Other hyperlipidemia: Secondary | ICD-10-CM | POA: Diagnosis not present

## 2015-12-16 NOTE — Telephone Encounter (Signed)
Pt spouse called stating pt fell in back yard 7/13 and went [facility] with broken hip.  He is now in [facility] for rehab  He has been in hospital twice since.  Pt has not been eating.  They took him Wednesday last week discharged Friday and took back to [facility] with uti discharged yesterday.  Spouse stating he looks like he is shutting down.  She stated physical therapy can in today and was bragging that he was going good but spouse stated he had he eye closed the whole time.  He has not been his self for the last 2 week.  He has lost a lot of weight  Spouse wanted to make sure you were aware of this

## 2015-12-16 NOTE — Telephone Encounter (Signed)
Thanks for letting me know - does [facility] have a general doctor or extender that rounds there to check on him until he gets out and can see me?   I'm not sure.

## 2015-12-21 ENCOUNTER — Emergency Department: Payer: Medicare HMO

## 2015-12-21 ENCOUNTER — Emergency Department
Admission: EM | Admit: 2015-12-21 | Discharge: 2015-12-21 | Disposition: A | Discharge status: Home / Self Care | Payer: Medicare HMO | Attending: Emergency Medicine | Admitting: Emergency Medicine

## 2015-12-21 ENCOUNTER — Encounter: Payer: Self-pay | Admitting: Emergency Medicine

## 2015-12-21 DIAGNOSIS — E869 Volume depletion, unspecified: Secondary | ICD-10-CM | POA: Diagnosis not present

## 2015-12-21 DIAGNOSIS — Z7982 Long term (current) use of aspirin: Secondary | ICD-10-CM | POA: Diagnosis not present

## 2015-12-21 DIAGNOSIS — R778 Other specified abnormalities of plasma proteins: Secondary | ICD-10-CM

## 2015-12-21 DIAGNOSIS — Z85828 Personal history of other malignant neoplasm of skin: Secondary | ICD-10-CM | POA: Diagnosis not present

## 2015-12-21 DIAGNOSIS — R4182 Altered mental status, unspecified: Secondary | ICD-10-CM | POA: Diagnosis not present

## 2015-12-21 DIAGNOSIS — N183 Chronic kidney disease, stage 3 (moderate): Secondary | ICD-10-CM | POA: Diagnosis not present

## 2015-12-21 DIAGNOSIS — Z87891 Personal history of nicotine dependence: Secondary | ICD-10-CM | POA: Insufficient documentation

## 2015-12-21 DIAGNOSIS — R7989 Other specified abnormal findings of blood chemistry: Secondary | ICD-10-CM

## 2015-12-21 DIAGNOSIS — I129 Hypertensive chronic kidney disease with stage 1 through stage 4 chronic kidney disease, or unspecified chronic kidney disease: Secondary | ICD-10-CM | POA: Insufficient documentation

## 2015-12-21 DIAGNOSIS — Z79899 Other long term (current) drug therapy: Secondary | ICD-10-CM | POA: Insufficient documentation

## 2015-12-21 LAB — COMPREHENSIVE METABOLIC PANEL
ALK PHOS: 83 U/L (ref 38–126)
ALT: 23 U/L (ref 17–63)
ANION GAP: 10 (ref 5–15)
AST: 24 U/L (ref 15–41)
Albumin: 3 g/dL — ABNORMAL LOW (ref 3.5–5.0)
BUN: 16 mg/dL (ref 6–20)
CO2: 24 mmol/L (ref 22–32)
CREATININE: 1.32 mg/dL — AB (ref 0.61–1.24)
Calcium: 8.8 mg/dL — ABNORMAL LOW (ref 8.9–10.3)
Chloride: 106 mmol/L (ref 101–111)
GFR calc non Af Amer: 46 mL/min — ABNORMAL LOW (ref >60)
GFR, EST AFRICAN AMERICAN: 54 mL/min — AB (ref >60)
Glucose, Bld: 92 mg/dL (ref 65–99)
POTASSIUM: 3.7 mmol/L (ref 3.5–5.1)
SODIUM: 140 mmol/L (ref 135–145)
Total Bilirubin: 0.8 mg/dL (ref 0.3–1.2)
Total Protein: 6.6 g/dL (ref 6.5–8.1)

## 2015-12-21 LAB — URINALYSIS COMPLETE WITH MICROSCOPIC (ARMC ONLY)
BILIRUBIN URINE: NEGATIVE
GLUCOSE, UA: NEGATIVE mg/dL
LEUKOCYTES UA: NEGATIVE
Nitrite: NEGATIVE
PH: 5 (ref 5.0–8.0)
Protein, ur: NEGATIVE mg/dL
SPECIFIC GRAVITY, URINE: 1.013 (ref 1.005–1.030)
Squamous Epithelial / LPF: NONE SEEN

## 2015-12-21 LAB — CBC WITH DIFFERENTIAL/PLATELET
BASOS PCT: 0 %
Basophils Absolute: 0 10*3/uL (ref 0–0.1)
EOS ABS: 0.3 10*3/uL (ref 0–0.7)
EOS PCT: 3 %
HCT: 32.5 % — ABNORMAL LOW (ref 40.0–52.0)
HEMOGLOBIN: 10.8 g/dL — AB (ref 13.0–18.0)
LYMPHS ABS: 1.1 10*3/uL (ref 1.0–3.6)
Lymphocytes Relative: 11 %
MCH: 29.5 pg (ref 26.0–34.0)
MCHC: 33.1 g/dL (ref 32.0–36.0)
MCV: 89.2 fL (ref 80.0–100.0)
MONO ABS: 0.6 10*3/uL (ref 0.2–1.0)
MONOS PCT: 5 %
NEUTROS PCT: 81 %
Neutro Abs: 8.3 10*3/uL — ABNORMAL HIGH (ref 1.4–6.5)
Platelets: 255 10*3/uL (ref 150–440)
RBC: 3.65 MIL/uL — ABNORMAL LOW (ref 4.40–5.90)
RDW: 15.4 % — AB (ref 11.5–14.5)
WBC: 10.3 10*3/uL (ref 3.8–10.6)

## 2015-12-21 LAB — TROPONIN I
TROPONIN I: 0.06 ng/mL — AB (ref <0.03)
TROPONIN I: 0.04 ng/mL — AB (ref <0.03)

## 2015-12-21 LAB — LACTIC ACID, PLASMA: LACTIC ACID, VENOUS: 1.2 mmol/L (ref 0.5–1.9)

## 2015-12-21 LAB — LIPASE, BLOOD: LIPASE: 27 U/L (ref 11–51)

## 2015-12-21 MED ORDER — SODIUM CHLORIDE 0.9 % IV BOLUS (SEPSIS)
500.0000 mL | INTRAVENOUS | Status: AC
  Administered 2015-12-21: 500 mL via INTRAVENOUS

## 2015-12-21 NOTE — Telephone Encounter (Signed)
Patient's wife notified as instructed by telephone and verbalized understanding. Was advised by Mrs. Mione that she just got a call from staff at [name] and patient is on his way back to [facility] because he is not acting right and they think that he may have a UTI.

## 2015-12-21 NOTE — ED Notes (Signed)
Pt. Facility Verbalizes understanding of d/c instructions, EDP recommendations, and follow-up. VS stable and pain controlled per pt.  Pt. In NAD at time of d/c and denies further concerns regarding this visit. Pt. Out of the unit by EMS stretcher in good spirits. Pt and facility advised to return pt. to the ED at any time for emergent concerns, or for new/worsening symptoms.

## 2015-12-21 NOTE — Telephone Encounter (Signed)
Thanks for letting me know-I will be on the look out for notes

## 2015-12-21 NOTE — ED Notes (Signed)
Lab calls to note short green first sample. Updated they have second sample drawn from first successful IV start. Requested put duplicate order in and they will run on second sample.

## 2015-12-21 NOTE — Discharge Instructions (Signed)
Matthew [name] was evaluated today for concern for low blood pressure.  He has been in the emergency department over 5 hours and had an extensive workup which was all reassuring.  His blood pressure has been normal throughout his stay.  There is no evidence that his urinary tract infection is worsening, and in fact it has improved from prior testing.  We recommend no medication changes at this point.  We gave him a small bolus of fluids as he appeared to be a little bit volume depleted, but we recommend that he continue with his regular medication regimen and follow up with his primary care doctor at the next available opportunity.

## 2015-12-21 NOTE — ED Provider Notes (Signed)
[facility] Emergency Department Provider Note  ____________________________________________   First MD Initiated Contact with Patient 12/21/15 1131     (approximate)  I have reviewed the triage vital signs and the nursing notes.   HISTORY  Chief Complaint Hypotension and Altered Mental Status    HPI Matthew [name] is a 82 y.o. male who presents by EMS from [place] facility for evaluation of hypotension and possibly altered mental status.  Very limited information was provided from [place] to the paramedics except that his blood pressure was low this morning, reportedly in the 123XX123 systolic.  For EMS his blood pressure was just below 123XX123 systolic.  He is elderly and quiet but with no history ofdementia and he answers questions appropriately.  He denies being in any distress.  When asked if he is in pain, he says yes, and when asked what hurts, he states "my urinary tract".  He states that it started hurting yesterday and has gradually gotten worse over the course of the day.  He describes it as a moderate burning and pressure sensation.  He does have an indwelling Foley catheter that is chronic.  He has been hospitalized multiple times recently for acute renal failure, encephalopathy secondary to UTI from his chronic Foley catheter.  His last hospitalization was approximately 9 days ago and he was discharged 6 days ago.  His urine culture at the time showed enterococcus that was sensitive to Levaquin and ampicillin but he has a penicillin allergy so he was discharged on Levaquin.  According to his MAR and appears that he is still taking Levaquin 250 mg daily.  He denies fever/chills, chest pain, shortness of breath, nausea, vomiting, abdominal pain.   Past Medical History:  Diagnosis Date  . Allergic rhinitis   . Chronic constipation   . Chronic kidney disease    stage 3, [place] benign appearing cysts 05/2006  . History of CT scan of chest  6/09   calcification consistent with asbestos exp, also pleural nodule, incidental gallstones  . History of transfusion of whole blood   . Hypertension   . Melanoma ([place])    history of, by right eye  . Pulmonary nodules    being obs with serial CT, 6 month f/u stable 10/05  . Renal insufficiency     Patient Active Problem List   Diagnosis Date Noted  . Altered mental status 12/12/2015  . Acute renal failure (ARF) ([place]) 12/08/2015  . Hip fracture ([place]) 11/11/2015  . Encounter for Medicare annual wellness exam 07/09/2013  . Fatigue 07/09/2013  . Hx laparoscopic cholecystectomy 05/16/2013  . Prostate cancer screening 06/04/2011  . CONSTIPATION, CHRONIC 01/12/2010  . Nonspecific (abnormal) findings on radiological and other examination of body structure 10/07/2007  . ALLERGIC CONJUNCTIVITIS 10/04/2007  . EDEMA 10/04/2007  . HYPERCHOLESTEROLEMIA 08/10/2006  . RESTLESS LEG SYNDROME 08/10/2006  . Essential hypertension 08/10/2006  . ALLERGIC RHINITIS 08/10/2006  . Renal insufficiency 08/10/2006  . SKIN CANCER, HX OF 08/10/2006    Past Surgical History:  Procedure Laterality Date  . 2D echo  6/09   slight thickening of aortic valve with EF of 60%  . GANGLION CYST EXCISION     hands  . HIP ARTHROPLASTY Left 11/11/2015   Procedure: ARTHROPLASTY BIPOLAR HIP (HEMIARTHROPLASTY);  Surgeon: Corky Mull, MD;  Location: [facility] ORS;  Service: Orthopedics;  Laterality: Left;    Prior to Admission medications   Medication Sig Start Date End Date Taking? Authorizing Provider  [place]  Acids-Protein Hydrolys (FEEDING SUPPLEMENT, PRO-STAT SUGAR FREE 64,) LIQD Take 30 mLs by mouth daily. 12/10/15  Yes Fritzi Mandes, MD  amLODipine (NORVASC) 10 MG tablet Take 10 mg by mouth every morning.    Yes Historical Provider, MD  aspirin 81 MG tablet Take 81 mg by mouth daily.     Yes Historical Provider, MD  atorvastatin (LIPITOR) 20 MG tablet Take 20 mg by mouth at bedtime.    Yes Historical Provider, MD    docusate sodium (COLACE) 100 MG capsule Take 1 capsule (100 mg total) by mouth 2 (two) times daily. 12/10/15  Yes Fritzi Mandes, MD  levofloxacin (LEVAQUIN) 250 MG tablet Take 1 tablet (250 mg total) by mouth daily. 12/15/15  Yes Demetrios Loll, MD  mirtazapine (REMERON) 15 MG tablet Take 1 tablet (15 mg total) by mouth at bedtime. 12/10/15  Yes Fritzi Mandes, MD  oxycodone (OXY-IR) 5 MG capsule Take 5 mg by mouth every 4 (four) hours as needed.   Yes Historical Provider, MD  pantoprazole (PROTONIX) 40 MG tablet Take 1 tablet (40 mg total) by mouth daily. 11/12/15  Yes Vaughan Basta, MD  tamsulosin (FLOMAX) 0.4 MG CAPS capsule Take 1 capsule (0.4 mg total) by mouth daily. 12/10/15  Yes Fritzi Mandes, MD  tuberculin (TUBERSOL) 5 UNIT/0.1ML injection Inject 5 Units into the skin See admin instructions. Inject 0.1 ml (5 units) intradermally every evening shift every 14 days for ppd until 12/25/15.   Yes Historical Provider, MD  UNABLE TO FIND See admin instructions. Medpass 2.0  Use as directed 2 times a day for supplement.    Historical Provider, MD    Allergies Chlorthalidone; Codeine; Iodine; Penicillins; Ramipril; and Ropinirole hydrochloride  Family History  Problem Relation Age of Onset  . Hypertension Mother   . Hypertension Father   . Stroke Father   . Cancer Brother     pancreatic    Social History Social History  Substance Use Topics  . Smoking status: Former Research scientist (life sciences)  . Smokeless tobacco: Never Used     Comment: Quit smoking in 1957.  Marland Kitchen Alcohol use No    Review of Systems Constitutional: No fever/chills Eyes: No visual changes. ENT: No sore throat. Cardiovascular: Denies chest pain. Respiratory: Denies shortness of breath. Gastrointestinal: No abdominal pain.  No nausea, no vomiting.  No diarrhea.  No constipation. Genitourinary: Burning pain and pressure "in my urinary tract" a chronic Foley catheter Musculoskeletal: Negative for back pain. Skin: Negative for rash. Neurological:  Negative for headaches, focal weakness or numbness.  Generalized weakness and deconditioning. Psychiatric:Possible waxing and waning mental status according to nursing facility  10-point ROS otherwise negative.  ____________________________________________   PHYSICAL EXAM:  VITAL SIGNS: ED Triage Vitals  Enc Vitals Group     BP 12/21/15 1133 (!) 130/56     Pulse Rate 12/21/15 1133 81     Resp 12/21/15 1133 19     Temp 12/21/15 1133 97.3 F (36.3 C)     Temp Source 12/21/15 1133 Oral     SpO2 12/21/15 1133 98 %     Weight 12/21/15 1133 140 lb (63.5 kg)     Height 12/21/15 1133 5\' 8"  (1.727 m)     Head Circumference --      Peak Flow --      Pain Score 12/21/15 1129 8     Pain Loc --      Pain Edu? --      Excl. in [place]? --     Constitutional: Alert  and answers simple questions appropriately.  Elderly and chronically ill-appearing but in no acute distress Eyes: Conjunctivae are normal. PERRL. EOMI. Head: Atraumatic. Nose: No congestion/rhinnorhea. Mouth/Throat: Mucous membranes are moist.  Oropharynx non-erythematous. Neck: No stridor.  No meningeal signs.   Cardiovascular: Normal rate, regular rhythm. Good peripheral circulation. Grossly normal heart sounds. Respiratory: Normal respiratory effort.  No retractions. Lungs CTAB. Gastrointestinal: Cachectic.  Soft and nontender. No distention.  Genitourinary: Foley catheter in place with cloudy urine present in the tube Musculoskeletal: No lower extremity tenderness nor edema. No gross deformities of extremities. Neurologic:  Normal speech and language. No gross focal neurologic deficits are appreciated.  Skin:  Skin is pale, warm, dry and intact. No rash noted.  ____________________________________________   LABS (all labs ordered are listed, but only abnormal results are displayed)  Labs Reviewed  URINALYSIS COMPLETEWITH MICROSCOPIC ([place]) - Abnormal; Notable for the following:       Result Value   Color, Urine  YELLOW (*)    APPearance HAZY (*)    Ketones, ur 1+ (*)    Hgb urine dipstick 1+ (*)    Bacteria, UA RARE (*)    All other components within normal limits  CBC WITH DIFFERENTIAL/PLATELET - Abnormal; Notable for the following:    RBC 3.65 (*)    Hemoglobin 10.8 (*)    HCT 32.5 (*)    RDW 15.4 (*)    Neutro Abs 8.3 (*)    All other components within normal limits  COMPREHENSIVE METABOLIC PANEL - Abnormal; Notable for the following:    Creatinine, Ser 1.32 (*)    Calcium 8.8 (*)    Albumin 3.0 (*)    GFR calc non Af Amer 46 (*)    GFR calc Af Amer 54 (*)    All other components within normal limits  TROPONIN I - Abnormal; Notable for the following:    Troponin I 0.06 (*)    All other components within normal limits  TROPONIN I - Abnormal; Notable for the following:    Troponin I 0.04 (*)    All other components within normal limits  CULTURE, BLOOD (ROUTINE X 2)  CULTURE, BLOOD (ROUTINE X 2)  URINE CULTURE  LACTIC ACID, PLASMA  LIPASE, BLOOD   ____________________________________________  EKG  ED ECG REPORT I, Chevonne Bostrom, the attending physician, personally viewed and interpreted this ECG.  Date: 12/21/2015 EKG Time: 11:36 Rate: 81 Rhythm: normal sinus rhythm QRS Axis: normal Intervals: normal ST/T Wave abnormalities: normal, occasional PVC. Non-specific ST segment / T-wave changes, but no evidence of acute ischemia. Conduction Disturbances: none Narrative Interpretation: unremarkable  ____________________________________________  RADIOLOGY   Dg Chest Portable 1 View  Result Date: 12/21/2015 CLINICAL DATA:  Hypotension, weakness and altered mental status. EXAM: PORTABLE CHEST 1 VIEW COMPARISON:  12/12/2015 FINDINGS: Cardiomediastinal silhouette is normal. Mediastinal contours appear intact. Calcific atherosclerotic disease of the aorta noted. There is no evidence of focal airspace consolidation, pleural effusion or pneumothorax. Left basilar opacities again  seen, thought to represent pleural calcifications. Osseous structures are without acute abnormality. Soft tissues are grossly normal. IMPRESSION: No active disease. Calcific atherosclerotic disease of the aorta. Electronically Signed   By: Fidela Salisbury M.D.   On: 12/21/2015 12:04    ____________________________________________   PROCEDURES  Procedure(s) performed:   Procedures   Critical Care performed: No ____________________________________________   INITIAL IMPRESSION / ASSESSMENT AND PLAN / ED COURSE  Pertinent labs & imaging results that were available during my care of the patient were  reviewed by me and considered in my medical decision making (see chart for details).  Persistent or refractory urinary tract infection is certainly probable.  There is cloudy urine in the tube and he clearly states that he started having more pain starting yesterday which has gradually gotten worse.   Initially he was hypotensive both for the skilled nursing facility and for the paramedics, although his triage vital signs are all within normal limits in the emergency department.  I will evaluate broadly but hold on empiric antibiotics since he is currently taking Levaquin which the last urine culture indicated should be effective.  Clinical Course  Value Comment By Time  WBC, UA: 0-5 No evidence of acute urinary tract infection and the patient is still on Levaquin.  I am awaiting the rest of his labs that he has been normotensive in the emergency department.  I anticipate discharge back to his facility. Hinda Kehr, MD 08/22 1330  Troponin I: (!!) 0.06 The patient does have a troponin of 0.06.  However, within the last couple of weeks when he was last hospitalized his troponin trended as high as 0.05.  I see no documentation of a cardiology consult; it is most likely that they felt he was having demand ischemia or that his troponin was elevated due to his acute kidney injury and urinary tract  infection.  Given that the patient is able to tell me he has no chest pain, no shortness of breath, he has no significant EKG changes, and he is otherwise in no acute distress, I feel that it would be in the patient's best interest to repeat a troponin rather than reflexively bring him back into the hospital where he is more likely to develop worsening delirium and be exposed to nosocomial infections.  I will repeat a second troponin and disposition him based on those results and any clinical changes in the meantime. Hinda Kehr, MD 08/22 1353  Creatinine: (!) 1.32 Last creatinines on record were approximately 0.99.  I will give him a small fluid bolus given the results today of 1.32, but this does not represent a significant clinical change. Hinda Kehr, MD 08/22 1355  Troponin I: (!!) 0.04 The patient's second troponin has gone down to 0.04.  He has been stable throughout his 5+ hour stay in the emergency department.  Discharge him back to his facility for close outpatient follow-up with there is no indication for admission at this point.  He has remained normotensive and otherwise hemodynamically stable. Hinda Kehr, MD 08/22 1643    ____________________________________________  FINAL CLINICAL IMPRESSION(S) / ED DIAGNOSES  Final diagnoses:  Volume depletion  Elevated troponin I level     MEDICATIONS GIVEN DURING THIS VISIT:  Medications  sodium chloride 0.9 % bolus 500 mL (0 mLs Intravenous Stopped 12/21/15 1627)     NEW OUTPATIENT MEDICATIONS STARTED DURING THIS VISIT:  New Prescriptions   No medications on file      Note:  This document was prepared using Dragon voice recognition software and may include unintentional dictation errors.    Hinda Kehr, MD 12/21/15 [phone]

## 2015-12-21 NOTE — ED Triage Notes (Signed)
Pt comes into the ED via EMS from [facility] w/ c/o hypotension and altered mental status.  Patient present in NAD at this time with even and unlabored respirations.  Initial pressure 100/63, presents with UTI and foley in place.  Denies chest pain, shortness of breath.  CBG 120.

## 2015-12-22 ENCOUNTER — Ambulatory Visit: Payer: Medicare HMO | Admitting: Family Medicine

## 2015-12-22 ENCOUNTER — Ambulatory Visit (INDEPENDENT_AMBULATORY_CARE_PROVIDER_SITE_OTHER): Payer: Medicare HMO | Admitting: Urology

## 2015-12-22 DIAGNOSIS — R339 Retention of urine, unspecified: Secondary | ICD-10-CM

## 2015-12-22 LAB — URINE CULTURE: CULTURE: NO GROWTH

## 2015-12-22 NOTE — Progress Notes (Signed)
Patient presented to clinic today unaccompanied by any family member or staff from nursing home. Patient is demented and unable to provide additional history. As such, we've contacted his family and will reschedule for when they are available to accompany him.  Hollice Espy, MD

## 2015-12-24 ENCOUNTER — Ambulatory Visit: Payer: Medicare HMO | Admitting: Family Medicine

## 2015-12-26 LAB — CULTURE, BLOOD (ROUTINE X 2)
CULTURE: NO GROWTH
Culture: NO GROWTH

## 2016-01-21 DIAGNOSIS — E46 Unspecified protein-calorie malnutrition: Secondary | ICD-10-CM | POA: Diagnosis not present

## 2016-01-28 ENCOUNTER — Encounter: Payer: Self-pay | Admitting: Urology

## 2016-01-28 ENCOUNTER — Ambulatory Visit (INDEPENDENT_AMBULATORY_CARE_PROVIDER_SITE_OTHER): Payer: Medicare Other | Admitting: Urology

## 2016-01-28 VITALS — BP 129/74 | HR 80 | Ht 68.0 in | Wt 140.0 lb

## 2016-01-28 DIAGNOSIS — R339 Retention of urine, unspecified: Secondary | ICD-10-CM | POA: Diagnosis not present

## 2016-01-28 DIAGNOSIS — N400 Enlarged prostate without lower urinary tract symptoms: Secondary | ICD-10-CM

## 2016-01-28 NOTE — Progress Notes (Signed)
01/28/2016 3:23 PM   Matthew [name] [birth year] [id number]  Referring provider: Abner Greenspan, MD [address] [place], [place] [address]  Chief Complaint  Patient presents with  . Urinary Retention    HPI: The patient is an 82 year old male For evaluation of urinary retention. He had a Foley catheter placed when he has been to the hospital in December 07, 2015 for decreased fluid intake due to a distended bladder on CT scan.  He had severe constipation that required descompaction. He was treated at this visit for a urinary tract infection on December 12, 2015. This was based on a culture of enterococcus species however it only grew 20,000 CFU/mL.   He Reports that he nocturia 2-3 and a strain to urinate prior to catheter placement. He did feel he emptied his bladder untilaround the time the catheter was placed. He was started on Flomax the time of catheter placement.. He has been in a rehab home since July 2017 following a hip fracture.    PMH: Past Medical History:  Diagnosis Date  . Allergic rhinitis   . Chronic constipation   . Chronic kidney disease    stage 3, [place] benign appearing cysts 05/2006  . History of CT scan of chest 6/09   calcification consistent with asbestos exp, also pleural nodule, incidental gallstones  . History of transfusion of whole blood   . Hypertension   . Melanoma ([place])    history of, by right eye  . Pulmonary nodules    being obs with serial CT, 6 month f/u stable 10/05  . Renal insufficiency     Surgical History: Past Surgical History:  Procedure Laterality Date  . 2D echo  6/09   slight thickening of aortic valve with EF of 60%  . GANGLION CYST EXCISION     hands  . HIP ARTHROPLASTY Left 11/11/2015   Procedure: ARTHROPLASTY BIPOLAR HIP (HEMIARTHROPLASTY);  Surgeon: Corky Mull, MD;  Location: [facility] ORS;  Service: Orthopedics;  Laterality: Left;    Home Medications:    Medication List       Accurate as of  01/28/16  3:23 PM. Always use your most recent med list.          amLODipine 10 MG tablet Commonly known as:  NORVASC Take 10 mg by mouth every morning.   aspirin 81 MG tablet Take 81 mg by mouth daily.   atorvastatin 20 MG tablet Commonly known as:  LIPITOR Take 20 mg by mouth at bedtime.   docusate sodium 100 MG capsule Commonly known as:  COLACE Take 1 capsule (100 mg total) by mouth 2 (two) times daily.   feeding supplement (PRO-STAT SUGAR FREE 64) Liqd Take 30 mLs by mouth daily.   mirtazapine 15 MG tablet Commonly known as:  REMERON Take 1 tablet (15 mg total) by mouth at bedtime.   oxycodone 5 MG capsule Commonly known as:  OXY-IR Take 5 mg by mouth every 4 (four) hours as needed.   pantoprazole 40 MG tablet Commonly known as:  PROTONIX Take 1 tablet (40 mg total) by mouth daily.   tamsulosin 0.4 MG Caps capsule Commonly known as:  FLOMAX Take 1 capsule (0.4 mg total) by mouth daily.   TUBERSOL 5 UNIT/0.1ML injection Generic drug:  tuberculin Inject 5 Units into the skin See admin instructions. Inject 0.1 ml (5 units) intradermally every evening shift every 14 days for ppd until 12/25/15.   UNABLE TO FIND See admin  instructions. Medpass 2.0  Use as directed 2 times a day for supplement.       Allergies:  Allergies  Allergen Reactions  . Chlorthalidone     REACTION: frequent urination  . Codeine     REACTION: nausea  . Iodine     REACTION: caused bubble to come up in eye  . Penicillins Other (See Comments)    REACTION: GI Has patient had a PCN reaction causing immediate rash, facial/tongue/throat swelling, SOB or lightheadedness with hypotension: No Has patient had a PCN reaction causing severe rash involving mucus membranes or skin necrosis: No Has patient had a PCN reaction that required hospitalization: pt not sure Has patient had a PCN reaction occurring within the last 10 years: pt not sure If all of the above answers are "NO", then may proceed  with Cephalosporin use.  . Ramipril     REACTION: cough  . Ropinirole Hydrochloride     REACTION: insomnia    Family History: Family History  Problem Relation Age of Onset  . Hypertension Mother   . Hypertension Father   . Stroke Father   . Cancer Brother     pancreatic    Social History:  reports that he has quit smoking. He has never used smokeless tobacco. He reports that he does not drink alcohol or use drugs.  ROS: UROLOGY Frequent Urination?: Yes Hard to postpone urination?: Yes Burning/pain with urination?: No Get up at night to urinate?: Yes Leakage of urine?: No Urine stream starts and stops?: Yes Trouble starting stream?: No Do you have to strain to urinate?: No Blood in urine?: No Urinary tract infection?: No Sexually transmitted disease?: No Injury to kidneys or bladder?: No Painful intercourse?: No Weak stream?: No Erection problems?: No Penile pain?: No  Gastrointestinal Nausea?: No Vomiting?: No Indigestion/heartburn?: No Diarrhea?: No Constipation?: No  Constitutional Fever: No Night sweats?: No Weight loss?: No Fatigue?: No  Skin Skin rash/lesions?: No Itching?: No  Eyes Blurred vision?: No Double vision?: No  Ears/Nose/Throat Sore throat?: No Sinus problems?: No  Hematologic/Lymphatic Swollen glands?: No Easy bruising?: No  Cardiovascular Leg swelling?: No Chest pain?: No  Respiratory Cough?: No Shortness of breath?: No  Endocrine Excessive thirst?: No  Musculoskeletal Back pain?: No Joint pain?: No  Neurological Headaches?: No Dizziness?: No  Psychologic Depression?: No Anxiety?: No  Physical Exam: BP 129/74   Pulse 80   Ht 5\' 8"  (1.727 m)   Wt 140 lb (63.5 kg) Comment: pt reports weight  BMI 21.29 kg/m   Constitutional:  Alert and oriented, No acute distress. HEENT: [place] AT, moist mucus membranes.  Trachea midline, no masses. Cardiovascular: No clubbing, cyanosis, or edema. Respiratory: Normal  respiratory effort, no increased work of breathing. GI: Abdomen is soft, nontender, nondistended, no abdominal masses GU: No CVA tenderness. Foley in place. Skin: No rashes, bruises or suspicious lesions. Lymph: No cervical or inguinal adenopathy. Neurologic: Grossly intact, no focal deficits, moving all 4 extremities. Psychiatric: Normal mood and affect.  Laboratory Data: Lab Results  Component Value Date   WBC 10.3 12/21/2015   HGB 10.8 (L) 12/21/2015   HCT 32.5 (L) 12/21/2015   MCV 89.2 12/21/2015   PLT 255 12/21/2015    Lab Results  Component Value Date   CREATININE 1.32 (H) 12/21/2015    Lab Results  Component Value Date   PSA 1.58 06/05/2011   PSA 1.60 07/11/2010   PSA 1.57 05/28/2007    No results found for: TESTOSTERONE  No results found for:  HGBA1C  Urinalysis    Component Value Date/Time   COLORURINE YELLOW (A) 12/21/2015 1215   APPEARANCEUR HAZY (A) 12/21/2015 1215   APPEARANCEUR Clear 04/24/2013 0522   LABSPEC 1.013 12/21/2015 1215   LABSPEC 1.004 04/24/2013 0522   PHURINE 5.0 12/21/2015 1215   GLUCOSEU NEGATIVE 12/21/2015 1215   GLUCOSEU Negative 04/24/2013 0522   HGBUR 1+ (A) 12/21/2015 1215   BILIRUBINUR NEGATIVE 12/21/2015 1215   BILIRUBINUR Negative 04/24/2013 0522   KETONESUR 1+ (A) 12/21/2015 1215   PROTEINUR NEGATIVE 12/21/2015 1215   NITRITE NEGATIVE 12/21/2015 1215   LEUKOCYTESUR NEGATIVE 12/21/2015 1215   LEUKOCYTESUR Negative 04/24/2013 0522    Pertinent Imaging: CT abdomen/pelvis from August 2017 reviewed.  Assessment & Plan:    I feel the patient's urinary retention was multifactorial which included narcotics, severe constipation, as well as BPH. His constipation has been manually disimpacted, and he's been on Flomax for some time now. I think giving him a trial of void is reasonable. I however will wait until next week as I do not want him to end up in the emergency room if he is unable to void as the office closes for the weekend  in one hour. He'll follow-up for an a.m. Nurse's visit next week for a filling pole trial of void. He will see me in one month for follow-up of his able to pass a trial of void.  1. BPH 2. Urinary retention -continue flomax -follow up for nurse visit next week for fill and pull trial of void -follow up with me in one month for symptom check   Return for [place] next week for TOV, 1 month to see MD.  Nickie Retort, [facility] [address], [place], [place] [address] [phone]

## 2016-01-30 DIAGNOSIS — E46 Unspecified protein-calorie malnutrition: Secondary | ICD-10-CM | POA: Diagnosis not present

## 2016-01-31 ENCOUNTER — Ambulatory Visit: Payer: Medicare HMO

## 2016-02-02 DIAGNOSIS — N390 Urinary tract infection, site not specified: Secondary | ICD-10-CM | POA: Diagnosis not present

## 2016-02-03 DIAGNOSIS — B029 Zoster without complications: Secondary | ICD-10-CM | POA: Diagnosis not present

## 2016-02-21 DIAGNOSIS — Z23 Encounter for immunization: Secondary | ICD-10-CM | POA: Diagnosis not present

## 2016-02-25 ENCOUNTER — Ambulatory Visit: Payer: Medicare HMO

## 2016-03-01 ENCOUNTER — Ambulatory Visit: Payer: Medicare HMO

## 2016-03-01 DIAGNOSIS — E46 Unspecified protein-calorie malnutrition: Secondary | ICD-10-CM | POA: Diagnosis not present

## 2016-03-02 ENCOUNTER — Encounter: Payer: Self-pay | Admitting: Urology

## 2016-03-02 ENCOUNTER — Ambulatory Visit (INDEPENDENT_AMBULATORY_CARE_PROVIDER_SITE_OTHER): Payer: Medicare Other | Admitting: Urology

## 2016-03-02 VITALS — BP 132/67 | HR 84 | Ht 68.0 in | Wt 135.0 lb

## 2016-03-02 DIAGNOSIS — N401 Enlarged prostate with lower urinary tract symptoms: Secondary | ICD-10-CM

## 2016-03-02 DIAGNOSIS — N138 Other obstructive and reflux uropathy: Secondary | ICD-10-CM | POA: Diagnosis not present

## 2016-03-02 DIAGNOSIS — R339 Retention of urine, unspecified: Secondary | ICD-10-CM

## 2016-03-02 NOTE — Progress Notes (Signed)
03/02/2016 9:21 AM   Matthew [name] Jan 13, 1925 [id number]  Referring provider: Abner Greenspan, MD [address] [place], [place] [address]  Chief Complaint  Patient presents with  . Urinary Retention    HPI: Patient is an 82 year old Caucasian male who resides at [name] presents today for a voiding trial.  Background history The patient is an 82 year old male For evaluation of urinary retention. He had a Foley catheter placed when he has been to the hospital in December 07, 2015 for decreased fluid intake due to a distended bladder on CT scan.  He had severe constipation that required descompaction. He was treated at this visit for a urinary tract infection on December 12, 2015. This was based on a culture of enterococcus species however it only grew 20,000 CFU/mL.   He reports that he nocturia 2-3 and a strain to urinate prior to catheter placement. He did feel he emptied his bladder untilaround the time the catheter was placed. He was started on Flomax the time of catheter placement.. He has been in a rehab home since July 2017 following a hip fracture.  Today, he has hip pain.  He has no urinary complaints.  Foley was removed.  He has not seen blood in his urine.  He has not had fevers, chills, nausea or vomiting.    PMH: Past Medical History:  Diagnosis Date  . Allergic rhinitis   . Chronic constipation   . Chronic kidney disease    stage 3, [place] benign appearing cysts 05/2006  . History of CT scan of chest 6/09   calcification consistent with asbestos exp, also pleural nodule, incidental gallstones  . History of transfusion of whole blood   . Hypertension   . Melanoma ([place])    history of, by right eye  . Pulmonary nodules    being obs with serial CT, 6 month f/u stable 10/05  . Renal insufficiency     Surgical History: Past Surgical History:  Procedure Laterality Date  . 2D echo  6/09   slight thickening of aortic valve with EF of 60%  . GANGLION CYST  EXCISION     hands  . HIP ARTHROPLASTY Left 11/11/2015   Procedure: ARTHROPLASTY BIPOLAR HIP (HEMIARTHROPLASTY);  Surgeon: Corky Mull, MD;  Location: [facility] ORS;  Service: Orthopedics;  Laterality: Left;    Home Medications:    Medication List       Accurate as of 03/02/16  9:21 AM. Always use your most recent med list.          amLODipine 10 MG tablet Commonly known as:  NORVASC Take 10 mg by mouth every morning.   aspirin 81 MG tablet Take 81 mg by mouth daily.   atorvastatin 20 MG tablet Commonly known as:  LIPITOR Take 20 mg by mouth at bedtime.   docusate sodium 100 MG capsule Commonly known as:  COLACE Take 1 capsule (100 mg total) by mouth 2 (two) times daily.   mirtazapine 15 MG tablet Commonly known as:  REMERON Take 1 tablet (15 mg total) by mouth at bedtime.   oxycodone 5 MG capsule Commonly known as:  OXY-IR Take 5 mg by mouth every 4 (four) hours as needed.   pantoprazole 40 MG tablet Commonly known as:  PROTONIX Take 1 tablet (40 mg total) by mouth daily.   tamsulosin 0.4 MG Caps capsule Commonly known as:  FLOMAX Take 1 capsule (0.4 mg total) by mouth daily.  Allergies:  Allergies  Allergen Reactions  . Chlorthalidone     REACTION: frequent urination  . Codeine     REACTION: nausea  . Iodine     REACTION: caused bubble to come up in eye  . Penicillins Other (See Comments)    REACTION: GI Has patient had a PCN reaction causing immediate rash, facial/tongue/throat swelling, SOB or lightheadedness with hypotension: No Has patient had a PCN reaction causing severe rash involving mucus membranes or skin necrosis: No Has patient had a PCN reaction that required hospitalization: pt not sure Has patient had a PCN reaction occurring within the last 10 years: pt not sure If all of the above answers are "NO", then may proceed with Cephalosporin use.  . Ramipril     REACTION: cough  . Ropinirole Hydrochloride     REACTION: insomnia     Family History: Family History  Problem Relation Age of Onset  . Hypertension Mother   . Hypertension Father   . Stroke Father   . Cancer Brother     pancreatic    Social History:  reports that he has quit smoking. He has never used smokeless tobacco. He reports that he does not drink alcohol or use drugs.  ROS: UROLOGY Frequent Urination?: No Hard to postpone urination?: No Burning/pain with urination?: No Get up at night to urinate?: No Leakage of urine?: No Urine stream starts and stops?: No Trouble starting stream?: No Do you have to strain to urinate?: No Blood in urine?: No Urinary tract infection?: No Sexually transmitted disease?: No Injury to kidneys or bladder?: No Painful intercourse?: No Weak stream?: No Erection problems?: No Penile pain?: No  Gastrointestinal Nausea?: No Vomiting?: No Indigestion/heartburn?: No Diarrhea?: No Constipation?: No  Constitutional Fever: No Night sweats?: No Weight loss?: No Fatigue?: No  Skin Skin rash/lesions?: No Itching?: No  Eyes Blurred vision?: No Double vision?: No  Ears/Nose/Throat Sore throat?: No Sinus problems?: No  Hematologic/Lymphatic Swollen glands?: No Easy bruising?: No  Cardiovascular Leg swelling?: No Chest pain?: No  Respiratory Cough?: No Shortness of breath?: No  Endocrine Excessive thirst?: No  Musculoskeletal Back pain?: No Joint pain?: No  Neurological Headaches?: No Dizziness?: No  Psychologic Depression?: No Anxiety?: No  Physical Exam: BP 132/67   Pulse 84   Ht 5\' 8"  (1.727 m)   Wt 135 lb (61.2 kg)   BMI 20.53 kg/m   Constitutional: Well nourished. Alert and oriented, No acute distress. HEENT: [place] AT, moist mucus membranes. Trachea midline, no masses. Cardiovascular: No clubbing, cyanosis, or edema. Respiratory: Normal respiratory effort, no increased work of breathing. Skin: No rashes, bruises or suspicious lesions. Lymph: No cervical or  inguinal adenopathy. Neurologic: Grossly intact, no focal deficits, moving all 4 extremities. Psychiatric: Normal mood and affect.  Laboratory Data: Lab Results  Component Value Date   WBC 10.3 12/21/2015   HGB 10.8 (L) 12/21/2015   HCT 32.5 (L) 12/21/2015   MCV 89.2 12/21/2015   PLT 255 12/21/2015    Lab Results  Component Value Date   CREATININE 1.32 (H) 12/21/2015    Lab Results  Component Value Date   PSA 1.58 06/05/2011   PSA 1.60 07/11/2010   PSA 1.57 05/28/2007    Lab Results  Component Value Date   TSH 4.25 08/02/2015       Component Value Date/Time   CHOL 135 08/02/2015 0929   HDL 49.50 08/02/2015 0929   CHOLHDL 3 08/02/2015 0929   VLDL 12.2 08/02/2015 0929   LDLCALC 74  08/02/2015 0929    Lab Results  Component Value Date   AST 24 12/21/2015   Lab Results  Component Value Date   ALT 23 12/21/2015     Assessment & Plan:    1. Acute urinary retention:     - foley catheter removed  - voiding trial today    - SNIF is to place Foley if patient is unable to urinate and his PVR is > 300 cc  - follow-up in one month for I PSS score, PVR and exam.  2. BPH with obstruction  - continue Flomax  - RTC in one month  Return in about 1 month (around 04/01/2016) for IPSS and PVR .  These notes generated with voice recognition software. I apologize for typographical errors.  Zara Council, [facility] [address], [place], [place] [address] [phone]

## 2016-03-02 NOTE — Progress Notes (Signed)
Catheter Removal  Patient is present today for a catheter removal.  8ml of water was drained from the balloon. A 16FR foley cath was removed from the bladder no complications were noted . Patient tolerated well.  Preformed by: Chen Holzman, CMA  

## 2016-03-21 DIAGNOSIS — Z79899 Other long term (current) drug therapy: Secondary | ICD-10-CM | POA: Diagnosis not present

## 2016-03-31 DIAGNOSIS — E46 Unspecified protein-calorie malnutrition: Secondary | ICD-10-CM | POA: Diagnosis not present

## 2016-04-03 ENCOUNTER — Encounter: Payer: Self-pay | Admitting: Urology

## 2016-04-03 ENCOUNTER — Ambulatory Visit (INDEPENDENT_AMBULATORY_CARE_PROVIDER_SITE_OTHER): Payer: Medicare Other | Admitting: Urology

## 2016-04-03 VITALS — BP 86/52 | HR 73 | Ht 68.0 in | Wt 135.0 lb

## 2016-04-03 DIAGNOSIS — Z87898 Personal history of other specified conditions: Secondary | ICD-10-CM

## 2016-04-03 DIAGNOSIS — N400 Enlarged prostate without lower urinary tract symptoms: Secondary | ICD-10-CM

## 2016-04-03 LAB — BLADDER SCAN AMB NON-IMAGING: Scan Result: 0

## 2016-04-03 NOTE — Progress Notes (Signed)
04/03/2016 9:29 AM   Matthew [name] [birth year] [id number]  Referring provider: Abner Greenspan, MD [address] [place], [place] [address]  Chief Complaint  Patient presents with  . Benign Prostatic Hypertrophy    1 month follow up  . Urinary Retention    HPI: Patient is an 82 year old Caucasian male who resides at [name] presents today for a one month follow up after undergoing a voiding trial.    Background history The patient is an 82 year old male For evaluation of urinary retention. He had a Foley catheter placed when he has been to the hospital in December 07, 2015 for decreased fluid intake due to a distended bladder on CT scan.  He had severe constipation that required disimpaction. He was treated at this visit for a urinary tract infection on December 12, 2015. This was based on a culture of enterococcus species however it only grew 20,000 CFU/mL.   He reports that he nocturia 2-3 and a strain to urinate prior to catheter placement. He did feel he emptied his bladder until around the time the catheter was placed. He was started on Flomax the time of catheter placement.. He has been in a rehab home since July 2017 following a hip fracture.  BPH WITH LUTS His IPSS score today is 7, which is mild lower urinary tract symptomatology. He is mostly satisfied with his quality life due to his urinary symptoms.  His PVR is 0 mL.   His major complaints today are nocturia x 3 and incontinence.  He has had these symptoms for several years.  He denies any dysuria, hematuria or suprapubic pain.   He currently taking tamsulosin 0.4 mg daily.   He also denies any recent fevers, chills, nausea or vomiting.   He does not have a family history of PCa.     IPSS    Row Name 04/03/16 0900         International Prostate Symptom Score   How often have you had the sensation of not emptying your bladder? Not at All     How often have you had to urinate less than every two hours? More  than half the time     How often have you found you stopped and started again several times when you urinated? Not at All     How often have you found it difficult to postpone urination? Not at All     How often have you had a weak urinary stream? Not at All     How often have you had to strain to start urination? Not at All     How many times did you typically get up at night to urinate? 3 Times     Total IPSS Score 7       Quality of Life due to urinary symptoms   If you were to spend the rest of your life with your urinary condition just the way it is now how would you feel about that? Mostly Satisfied        Score:  1-7 Mild 8-19 Moderate 20-35 Severe   PMH: Past Medical History:  Diagnosis Date  . Allergic rhinitis   . Chronic constipation   . Chronic kidney disease    stage 3, [place] benign appearing cysts 05/2006  . History of CT scan of chest 6/09   calcification consistent with asbestos exp, also pleural nodule, incidental gallstones  . History of transfusion of whole blood   .  Hypertension   . Melanoma ([place])    history of, by right eye  . Pulmonary nodules    being obs with serial CT, 6 month f/u stable 10/05  . Renal insufficiency     Surgical History: Past Surgical History:  Procedure Laterality Date  . 2D echo  6/09   slight thickening of aortic valve with EF of 60%  . GANGLION CYST EXCISION     hands  . HIP ARTHROPLASTY Left 11/11/2015   Procedure: ARTHROPLASTY BIPOLAR HIP (HEMIARTHROPLASTY);  Surgeon: Corky Mull, MD;  Location: [facility] ORS;  Service: Orthopedics;  Laterality: Left;    Home Medications:    Medication List       Accurate as of 04/03/16  9:29 AM. Always use your most recent med list.          amLODipine 10 MG tablet Commonly known as:  NORVASC Take 10 mg by mouth every morning.   aspirin 81 MG tablet Take 81 mg by mouth daily.   atorvastatin 20 MG tablet Commonly known as:  LIPITOR Take 20 mg by mouth at bedtime.   docusate  sodium 100 MG capsule Commonly known as:  COLACE Take 1 capsule (100 mg total) by mouth 2 (two) times daily.   mirtazapine 15 MG tablet Commonly known as:  REMERON Take 1 tablet (15 mg total) by mouth at bedtime.   oxycodone 5 MG capsule Commonly known as:  OXY-IR Take 5 mg by mouth every 4 (four) hours as needed.   pantoprazole 40 MG tablet Commonly known as:  PROTONIX Take 1 tablet (40 mg total) by mouth daily.   polyethylene glycol powder powder Commonly known as:  GLYCOLAX/MIRALAX Take 1 Container by mouth once.   prazosin 1 MG capsule Commonly known as:  MINIPRESS Take 3 mg by mouth at bedtime.   tamsulosin 0.4 MG Caps capsule Commonly known as:  FLOMAX Take 1 capsule (0.4 mg total) by mouth daily.       Allergies:  Allergies  Allergen Reactions  . Chlorthalidone     REACTION: frequent urination  . Codeine     REACTION: nausea  . Iodine     REACTION: caused bubble to come up in eye  . Penicillins Other (See Comments)    REACTION: GI Has patient had a PCN reaction causing immediate rash, facial/tongue/throat swelling, SOB or lightheadedness with hypotension: No Has patient had a PCN reaction causing severe rash involving mucus membranes or skin necrosis: No Has patient had a PCN reaction that required hospitalization: pt not sure Has patient had a PCN reaction occurring within the last 10 years: pt not sure If all of the above answers are "NO", then may proceed with Cephalosporin use.  . Ramipril     REACTION: cough  . Ropinirole Hydrochloride     REACTION: insomnia    Family History: Family History  Problem Relation Age of Onset  . Hypertension Mother   . Hypertension Father   . Stroke Father   . Cancer Brother     pancreatic  . Prostate cancer Neg Hx   . Kidney disease Neg Hx     Social History:  reports that he quit smoking about 61 years ago. He has never used smokeless tobacco. He reports that he does not drink alcohol or use  drugs.  ROS: UROLOGY Frequent Urination?: No Hard to postpone urination?: No Burning/pain with urination?: No Get up at night to urinate?: Yes Leakage of urine?: Yes Urine stream starts and stops?: No Trouble  starting stream?: No Do you have to strain to urinate?: No Blood in urine?: No Urinary tract infection?: No Sexually transmitted disease?: No Injury to kidneys or bladder?: No Painful intercourse?: No Weak stream?: No Erection problems?: No Penile pain?: No  Gastrointestinal Nausea?: No Vomiting?: No Indigestion/heartburn?: No Diarrhea?: Yes Constipation?: No  Constitutional Fever: No Night sweats?: No Weight loss?: No Fatigue?: No  Skin Skin rash/lesions?: No Itching?: No  Eyes Blurred vision?: No Double vision?: No  Ears/Nose/Throat Sore throat?: No Sinus problems?: No  Hematologic/Lymphatic Swollen glands?: No Easy bruising?: No  Cardiovascular Leg swelling?: Yes Chest pain?: No  Respiratory Cough?: No Shortness of breath?: No  Endocrine Excessive thirst?: No  Musculoskeletal Back pain?: No Joint pain?: Yes  Neurological Headaches?: Yes Dizziness?: No  Psychologic Depression?: No Anxiety?: No  Physical Exam: BP (!) 86/52   Pulse 73   Ht 5\' 8"  (1.727 m)   Wt 135 lb (61.2 kg)   BMI 20.53 kg/m   Constitutional: Well nourished. Alert and oriented, No acute distress. HEENT: [place] AT, moist mucus membranes. Trachea midline, no masses. Cardiovascular: No clubbing, cyanosis, or edema. Respiratory: Normal respiratory effort, no increased work of breathing. Skin: No rashes, bruises or suspicious lesions. Lymph: No cervical or inguinal adenopathy. Neurologic: Grossly intact, no focal deficits, moving all 4 extremities. Psychiatric: Normal mood and affect.  Laboratory Data: Lab Results  Component Value Date   WBC 10.3 12/21/2015   HGB 10.8 (L) 12/21/2015   HCT 32.5 (L) 12/21/2015   MCV 89.2 12/21/2015   PLT 255 12/21/2015     Lab Results  Component Value Date   CREATININE 1.32 (H) 12/21/2015    Lab Results  Component Value Date   PSA 1.58 06/05/2011   PSA 1.60 07/11/2010   PSA 1.57 05/28/2007    Lab Results  Component Value Date   TSH 4.25 08/02/2015       Component Value Date/Time   CHOL 135 08/02/2015 0929   HDL 49.50 08/02/2015 0929   CHOLHDL 3 08/02/2015 0929   VLDL 12.2 08/02/2015 0929   LDLCALC 74 08/02/2015 0929    Lab Results  Component Value Date   AST 24 12/21/2015   Lab Results  Component Value Date   ALT 23 12/21/2015     Assessment & Plan:    1. Acute urinary retention:     - resolved  - continue tamsulosin 0.4 mg daily  2. BPH with obstruction  - IPSS score is 7/2  - Continue conservative management, avoiding bladder irritants and timed voiding's  - Continue tamsulosin 0.4 mg daily   - RTC if symptoms worsen  Return if symptoms worsen or fail to improve.  These notes generated with voice recognition software. I apologize for typographical errors.  Zara Council, [facility] [address], [place], [place] [address] [phone]

## 2016-04-12 ENCOUNTER — Other Ambulatory Visit: Payer: Self-pay | Admitting: Family Medicine

## 2016-04-12 NOTE — Telephone Encounter (Signed)
Erroneous encounter

## 2016-04-14 DIAGNOSIS — E784 Other hyperlipidemia: Secondary | ICD-10-CM | POA: Diagnosis not present

## 2016-04-14 DIAGNOSIS — I10 Essential (primary) hypertension: Secondary | ICD-10-CM | POA: Diagnosis not present

## 2016-04-14 DIAGNOSIS — R69 Illness, unspecified: Secondary | ICD-10-CM | POA: Diagnosis not present

## 2016-04-14 DIAGNOSIS — N401 Enlarged prostate with lower urinary tract symptoms: Secondary | ICD-10-CM | POA: Diagnosis not present

## 2016-05-01 DIAGNOSIS — E46 Unspecified protein-calorie malnutrition: Secondary | ICD-10-CM | POA: Diagnosis not present

## 2016-05-03 DIAGNOSIS — R627 Adult failure to thrive: Secondary | ICD-10-CM | POA: Diagnosis not present

## 2016-05-03 DIAGNOSIS — K219 Gastro-esophageal reflux disease without esophagitis: Secondary | ICD-10-CM | POA: Diagnosis not present

## 2016-05-03 DIAGNOSIS — N400 Enlarged prostate without lower urinary tract symptoms: Secondary | ICD-10-CM | POA: Diagnosis not present

## 2016-05-04 DIAGNOSIS — R627 Adult failure to thrive: Secondary | ICD-10-CM | POA: Diagnosis not present

## 2016-05-04 DIAGNOSIS — E46 Unspecified protein-calorie malnutrition: Secondary | ICD-10-CM | POA: Diagnosis not present

## 2016-05-04 DIAGNOSIS — N189 Chronic kidney disease, unspecified: Secondary | ICD-10-CM | POA: Diagnosis not present

## 2016-05-11 DIAGNOSIS — I10 Essential (primary) hypertension: Secondary | ICD-10-CM | POA: Diagnosis not present

## 2016-05-11 DIAGNOSIS — E785 Hyperlipidemia, unspecified: Secondary | ICD-10-CM | POA: Diagnosis not present

## 2016-05-11 DIAGNOSIS — N401 Enlarged prostate with lower urinary tract symptoms: Secondary | ICD-10-CM | POA: Diagnosis not present

## 2016-05-11 DIAGNOSIS — R69 Illness, unspecified: Secondary | ICD-10-CM | POA: Diagnosis not present

## 2016-05-23 DIAGNOSIS — R69 Illness, unspecified: Secondary | ICD-10-CM | POA: Diagnosis not present

## 2016-05-23 DIAGNOSIS — F514 Sleep terrors [night terrors]: Secondary | ICD-10-CM | POA: Diagnosis not present

## 2016-06-01 DIAGNOSIS — E46 Unspecified protein-calorie malnutrition: Secondary | ICD-10-CM | POA: Diagnosis not present

## 2016-06-08 ENCOUNTER — Telehealth: Payer: Self-pay

## 2016-06-08 NOTE — Telephone Encounter (Signed)
Left message with contact info for pt to return phone call.

## 2016-06-29 DIAGNOSIS — E46 Unspecified protein-calorie malnutrition: Secondary | ICD-10-CM | POA: Diagnosis not present

## 2016-07-28 DIAGNOSIS — G301 Alzheimer's disease with late onset: Secondary | ICD-10-CM | POA: Diagnosis not present

## 2016-07-28 DIAGNOSIS — E7800 Pure hypercholesterolemia, unspecified: Secondary | ICD-10-CM | POA: Diagnosis not present

## 2016-07-28 DIAGNOSIS — R350 Frequency of micturition: Secondary | ICD-10-CM | POA: Diagnosis not present

## 2016-07-28 DIAGNOSIS — N401 Enlarged prostate with lower urinary tract symptoms: Secondary | ICD-10-CM | POA: Diagnosis not present

## 2016-07-28 DIAGNOSIS — R69 Illness, unspecified: Secondary | ICD-10-CM | POA: Diagnosis not present

## 2016-07-28 DIAGNOSIS — I10 Essential (primary) hypertension: Secondary | ICD-10-CM | POA: Diagnosis not present

## 2016-07-30 DIAGNOSIS — E46 Unspecified protein-calorie malnutrition: Secondary | ICD-10-CM | POA: Diagnosis not present

## 2016-08-01 DIAGNOSIS — G3184 Mild cognitive impairment, so stated: Secondary | ICD-10-CM | POA: Diagnosis not present

## 2016-08-01 DIAGNOSIS — R69 Illness, unspecified: Secondary | ICD-10-CM | POA: Diagnosis not present

## 2016-08-01 DIAGNOSIS — F514 Sleep terrors [night terrors]: Secondary | ICD-10-CM | POA: Diagnosis not present

## 2016-08-08 DIAGNOSIS — N189 Chronic kidney disease, unspecified: Secondary | ICD-10-CM | POA: Diagnosis not present

## 2016-08-08 DIAGNOSIS — E46 Unspecified protein-calorie malnutrition: Secondary | ICD-10-CM | POA: Diagnosis not present

## 2016-08-08 DIAGNOSIS — R627 Adult failure to thrive: Secondary | ICD-10-CM | POA: Diagnosis not present

## 2016-08-29 DIAGNOSIS — E46 Unspecified protein-calorie malnutrition: Secondary | ICD-10-CM | POA: Diagnosis not present

## 2016-09-22 DIAGNOSIS — E46 Unspecified protein-calorie malnutrition: Secondary | ICD-10-CM | POA: Diagnosis not present

## 2016-09-22 DIAGNOSIS — R627 Adult failure to thrive: Secondary | ICD-10-CM | POA: Diagnosis not present

## 2016-09-22 DIAGNOSIS — Z96649 Presence of unspecified artificial hip joint: Secondary | ICD-10-CM | POA: Diagnosis not present

## 2016-09-22 DIAGNOSIS — B379 Candidiasis, unspecified: Secondary | ICD-10-CM | POA: Diagnosis not present

## 2016-09-22 DIAGNOSIS — G301 Alzheimer's disease with late onset: Secondary | ICD-10-CM | POA: Diagnosis not present

## 2016-09-22 DIAGNOSIS — R69 Illness, unspecified: Secondary | ICD-10-CM | POA: Diagnosis not present

## 2016-09-22 DIAGNOSIS — I10 Essential (primary) hypertension: Secondary | ICD-10-CM | POA: Diagnosis not present

## 2016-09-22 DIAGNOSIS — N189 Chronic kidney disease, unspecified: Secondary | ICD-10-CM | POA: Diagnosis not present

## 2016-09-22 DIAGNOSIS — E7800 Pure hypercholesterolemia, unspecified: Secondary | ICD-10-CM | POA: Diagnosis not present

## 2016-09-26 DIAGNOSIS — L03312 Cellulitis of back [any part except buttock]: Secondary | ICD-10-CM | POA: Diagnosis not present

## 2016-09-26 DIAGNOSIS — R52 Pain, unspecified: Secondary | ICD-10-CM | POA: Diagnosis not present

## 2016-09-26 DIAGNOSIS — S51019A Laceration without foreign body of unspecified elbow, initial encounter: Secondary | ICD-10-CM | POA: Diagnosis not present

## 2016-09-29 DIAGNOSIS — E46 Unspecified protein-calorie malnutrition: Secondary | ICD-10-CM | POA: Diagnosis not present

## 2016-10-02 DIAGNOSIS — S51019A Laceration without foreign body of unspecified elbow, initial encounter: Secondary | ICD-10-CM | POA: Diagnosis not present

## 2016-10-02 DIAGNOSIS — R52 Pain, unspecified: Secondary | ICD-10-CM | POA: Diagnosis not present

## 2016-10-02 DIAGNOSIS — L03312 Cellulitis of back [any part except buttock]: Secondary | ICD-10-CM | POA: Diagnosis not present

## 2016-10-24 DIAGNOSIS — I10 Essential (primary) hypertension: Secondary | ICD-10-CM | POA: Diagnosis not present

## 2016-10-24 DIAGNOSIS — R69 Illness, unspecified: Secondary | ICD-10-CM | POA: Diagnosis not present

## 2016-10-24 DIAGNOSIS — E7800 Pure hypercholesterolemia, unspecified: Secondary | ICD-10-CM | POA: Diagnosis not present

## 2016-10-24 DIAGNOSIS — G301 Alzheimer's disease with late onset: Secondary | ICD-10-CM | POA: Diagnosis not present

## 2016-10-24 DIAGNOSIS — G3184 Mild cognitive impairment, so stated: Secondary | ICD-10-CM | POA: Diagnosis not present

## 2016-10-24 DIAGNOSIS — F514 Sleep terrors [night terrors]: Secondary | ICD-10-CM | POA: Diagnosis not present

## 2016-10-24 DIAGNOSIS — F339 Major depressive disorder, recurrent, unspecified: Secondary | ICD-10-CM | POA: Diagnosis not present

## 2016-10-24 DIAGNOSIS — R350 Frequency of micturition: Secondary | ICD-10-CM | POA: Diagnosis not present

## 2016-10-24 DIAGNOSIS — N401 Enlarged prostate with lower urinary tract symptoms: Secondary | ICD-10-CM | POA: Diagnosis not present

## 2016-10-29 DIAGNOSIS — E46 Unspecified protein-calorie malnutrition: Secondary | ICD-10-CM | POA: Diagnosis not present

## 2016-11-29 DEATH — deceased

## 2017-04-04 IMAGING — CT CT ABD-PELV W/O CM
2 of 4 series · 15 of 46 positions shown, 17 images · non-contrast
Comparison: Radiographs earlier this day.  CT 04/23/2013

CLINICAL DATA: 82-year-old with anorexia and bowel distention on
radiograph.

EXAM:
CT ABDOMEN AND PELVIS WITHOUT CONTRAST
TECHNIQUE: Multidetector CT imaging of the abdomen and pelvis was performed
following the standard protocol without IV contrast.

[Series 2: axial st · axial · 0.83mm/px · z∈[-409,+51]mm · 12 of 102 slices shown, 14 images]
[im 5/102  soft-tissue]
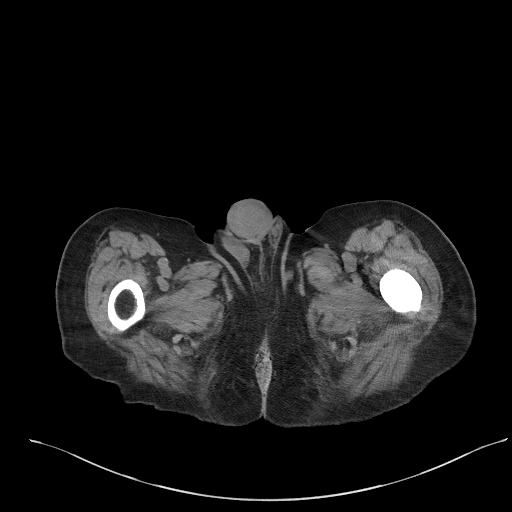
[im 5/102  bone]
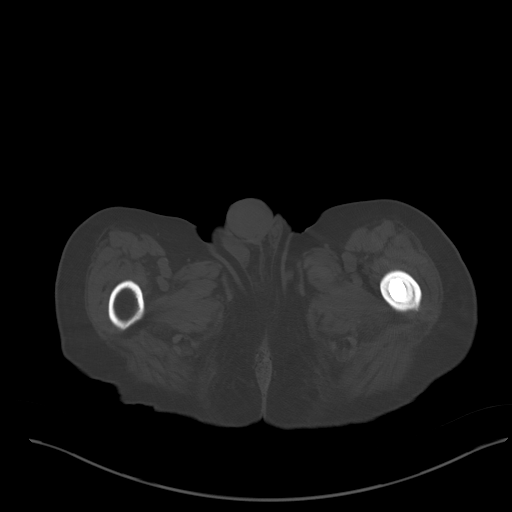
[im 13/102  soft-tissue]
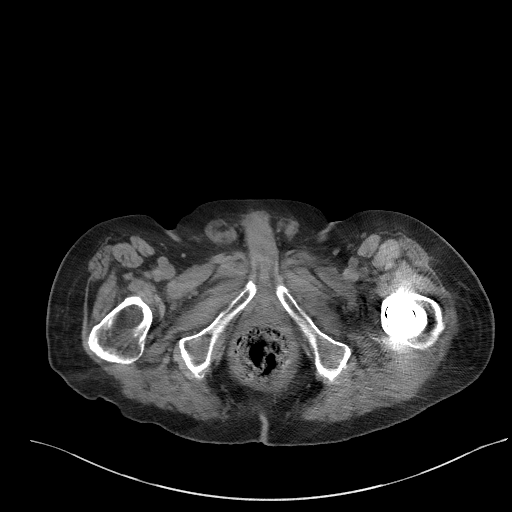
[im 22/102  soft-tissue]
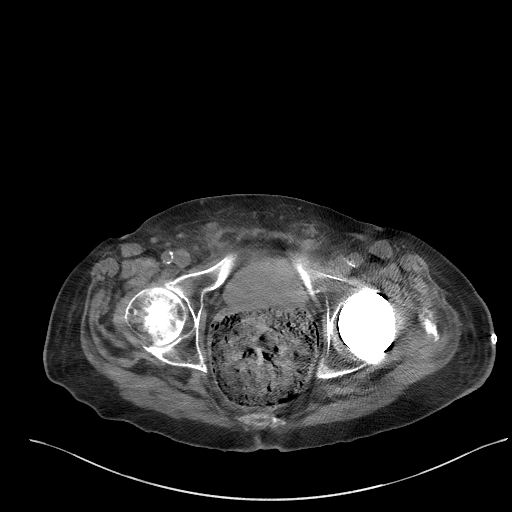
[im 30/102  soft-tissue]
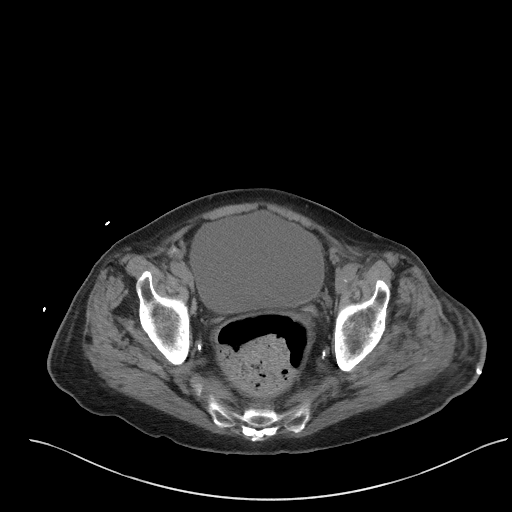
[im 38/102  soft-tissue]
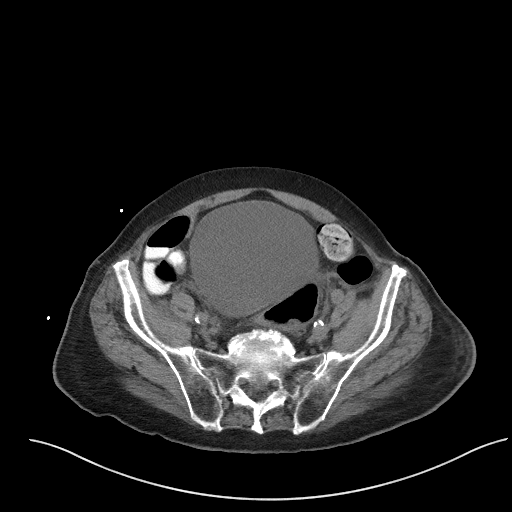
[im 47/102  soft-tissue]
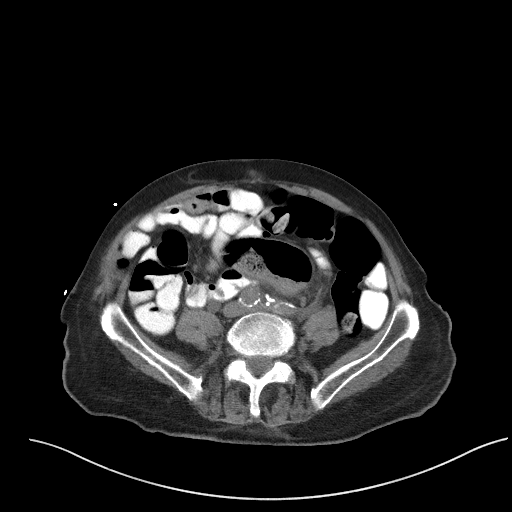
[im 55/102  soft-tissue]
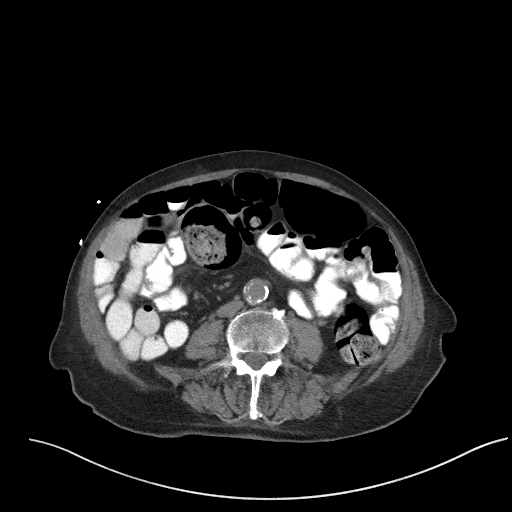
[im 64/102  soft-tissue]
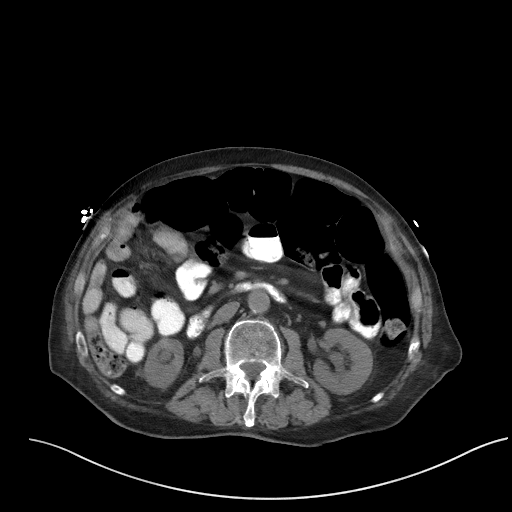
[im 72/102  soft-tissue]
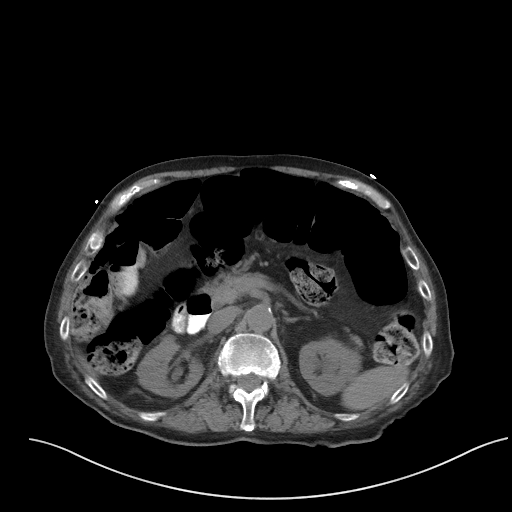
[im 72/102  bone]
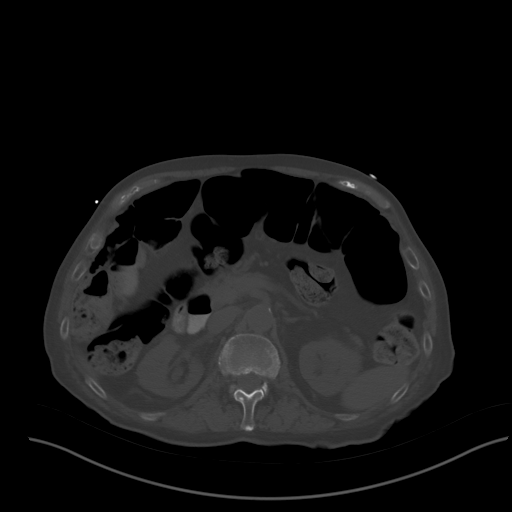
[im 80/102  soft-tissue]
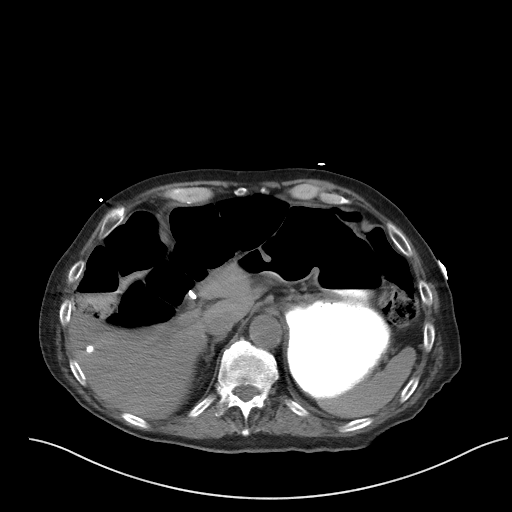
[im 89/102  soft-tissue]
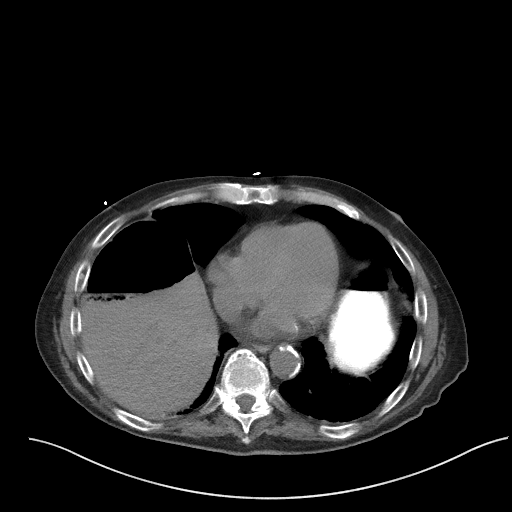
[im 97/102  soft-tissue]
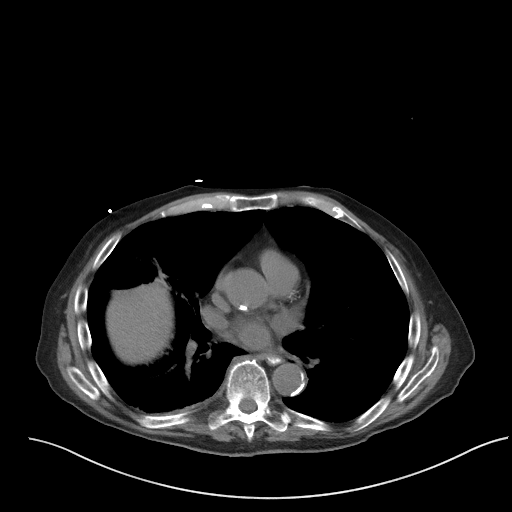

[Series 5: coronal st · coronal · 0.73mm/px · 3 of 84 slices shown]
[im 28/84  soft-tissue]
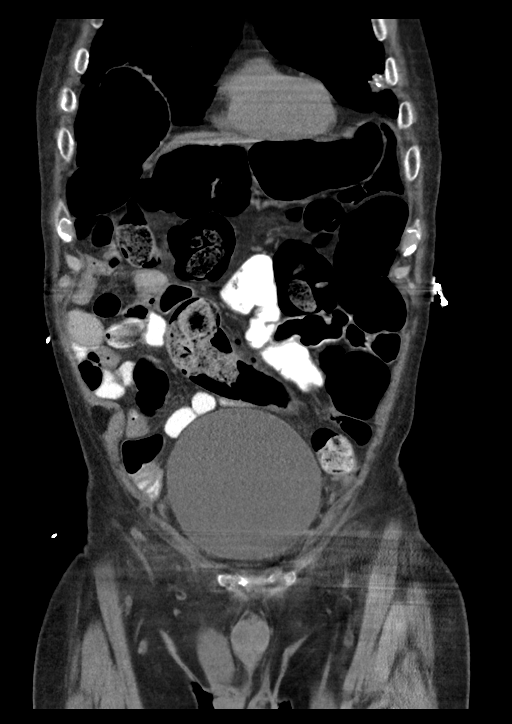
[im 37/84  soft-tissue]
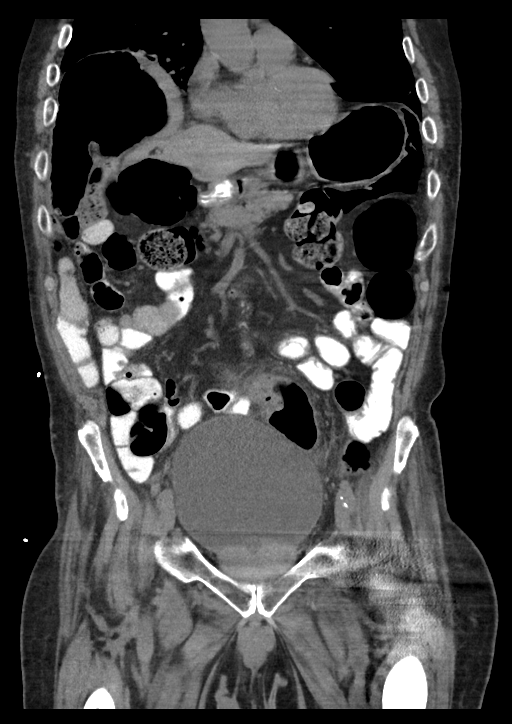
[im 47/84  soft-tissue]
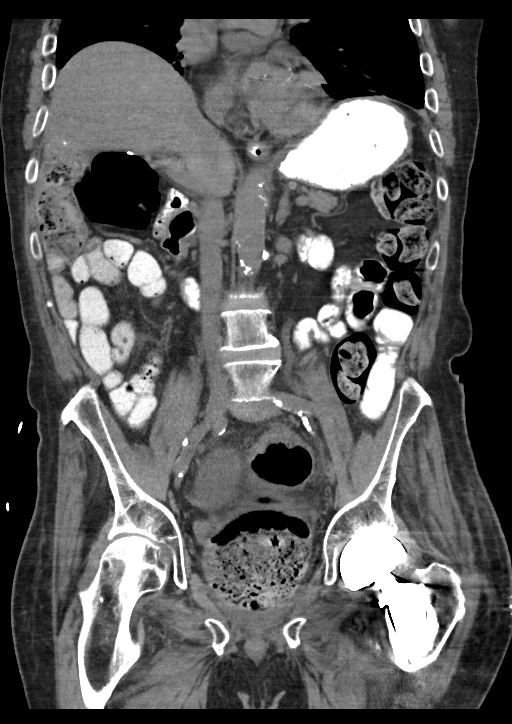

[15 of 46 positions shown; findings below may reference images not displayed]

FINDINGS: Lower chest: Right basilar atelectasis. Scattered hyperdensities
within the atelectatic lung. Coronary artery calcifications with
upper normal heart size. Scattered calcified pleural plaques
suspected.

Liver: Calcified granulomas in the right lobe.

Hepatobiliary: Clips in the gallbladder fossa postcholecystectomy.
No biliary dilatation.

Pancreas: No ductal dilatation or inflammation.

Spleen: Normal.

Adrenal glands: No nodule.  Thickening on the left unchanged.

Kidneys: Mild bilateral hydroureteronephrosis. No obstructing stone.
This may be secondary to bladder distention. There is a 1.4 cm cyst
in the upper right kidney. Small hyperdense focus in the mid right
renal cortex likely a hyperdense cyst.

Stomach/Bowel: There is enteric contrast in the distal esophagus. No
distal esophageal wall thickening. Stomach physiologically distended
with enteric contrast. There are no dilated or thickened small bowel
loops. Large stool burden with significant colonic tortuosity
consistent with constipation. There is a stool ball distending the
rectum, 9.4 cm transverse with mild distal rectal wall thickening.
There is otherwise no colonic wall thickening. Cecum is high-riding
in the right upper quadrant, unchanged. Enteric contrast just
reaches the cecum.

Vascular/Lymphatic: No retroperitoneal adenopathy. Abdominal aorta
is normal in caliber. Moderate atherosclerosis of the abdominal
aorta and its branches without aneurysm.

Reproductive: Prominent sized prostate gland spanning 6.5 cm
transverse. Soft tissue density in the right inguinal canal is
nonspecific, suspect varicoceles.

Bladder: Distended to the level of the umbilicus. No bladder wall
thickening.

Other: No free air, free fluid, or intra-abdominal fluid collection.

Musculoskeletal: There are no acute or suspicious osseous
abnormalities. Left hip arthroplasty.
IMPRESSION: 1. Large stool burden with colonic redundancy consistent with
constipation. Stool ball distending the rectum with wall thickening
suggesting fecal impaction. No small bowel obstruction.
2. Mild bilateral hydronephrosis is likely secondary to urinary
bladder distention. Prostate gland is enlarged, question chronic
bladder outlet obstruction.
3. Enteric contrast in the distal esophagus, reflux versus slow
transit.
4. Atherosclerosis including coronary artery calcifications.
5. Additional chronic findings as described.

## 2017-04-04 IMAGING — CR DG ABDOMEN ACUTE W/ 1V CHEST
3 series · 3 of 3 positions shown · non-contrast
Comparison: 04/23/2013.

CLINICAL DATA: Anorexia.

EXAM:
DG ABDOMEN ACUTE W/ 1V CHEST

[chest pa]
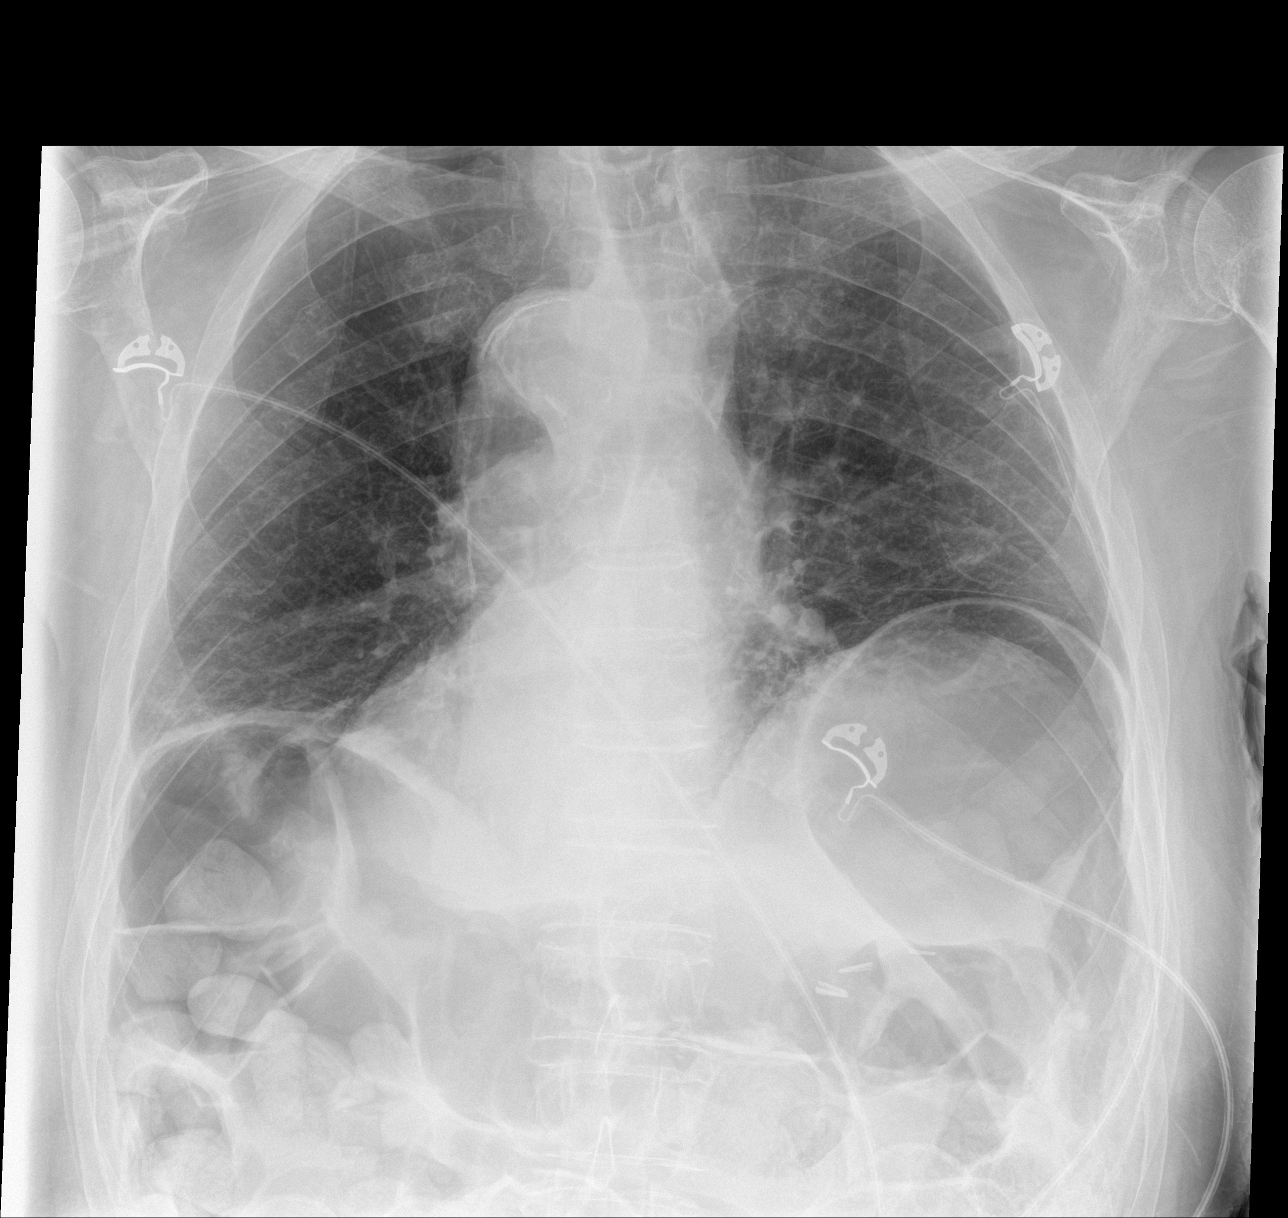

[abdomen erect]
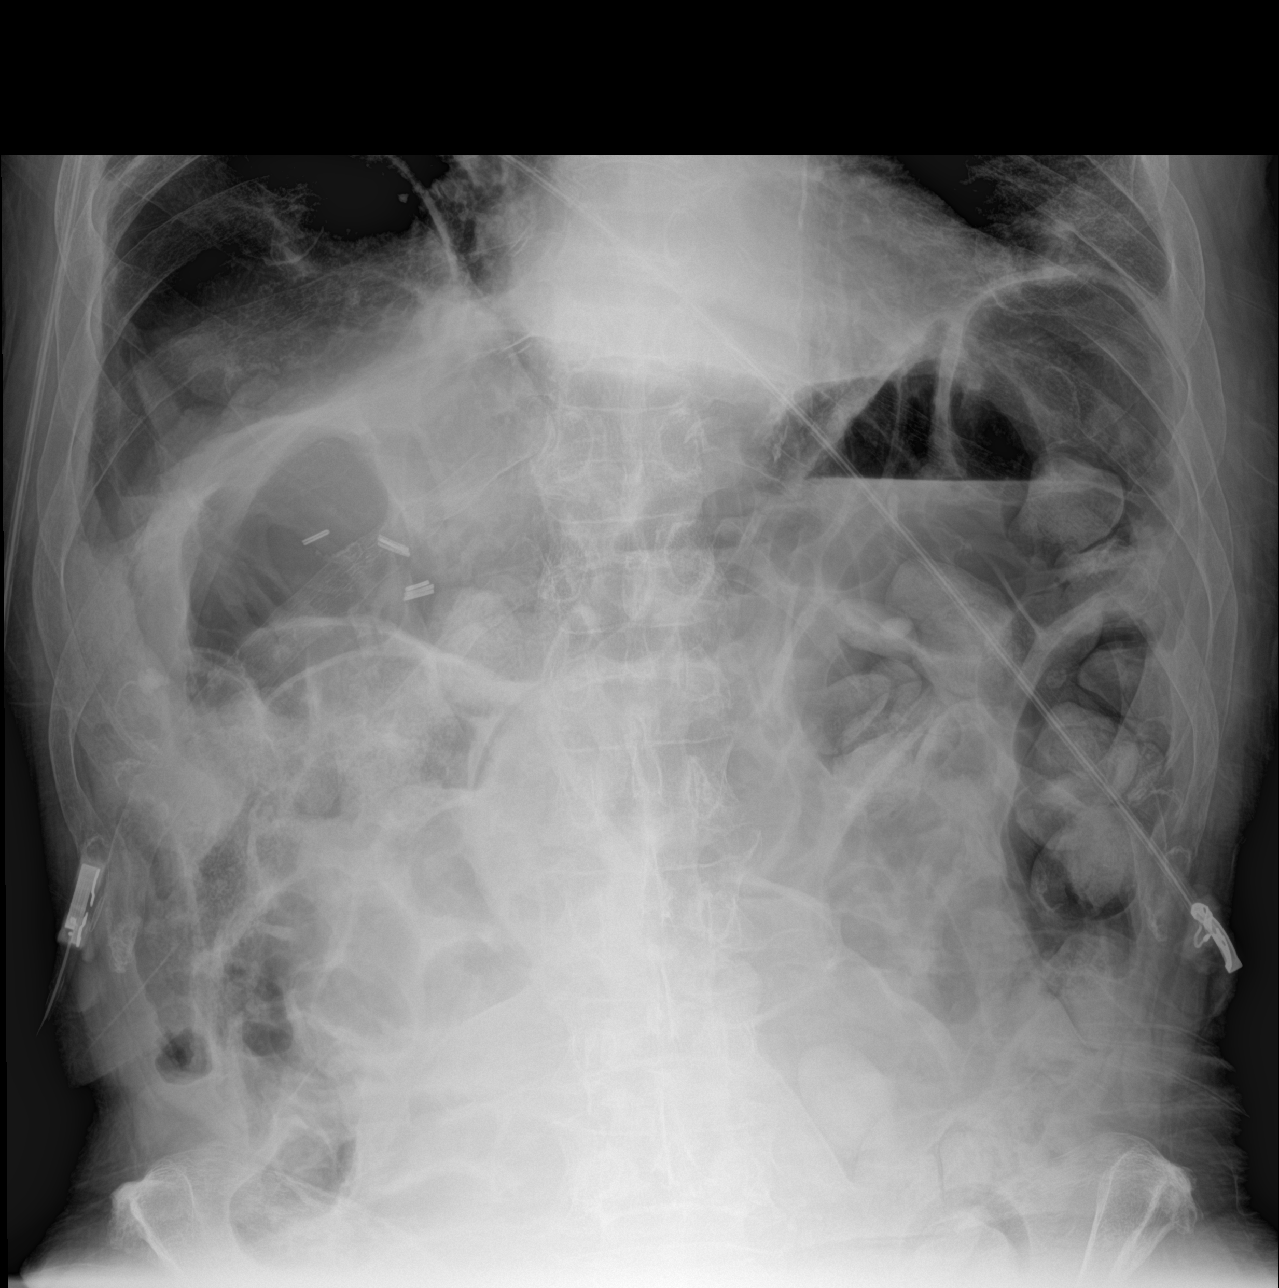

[abdomen supine]
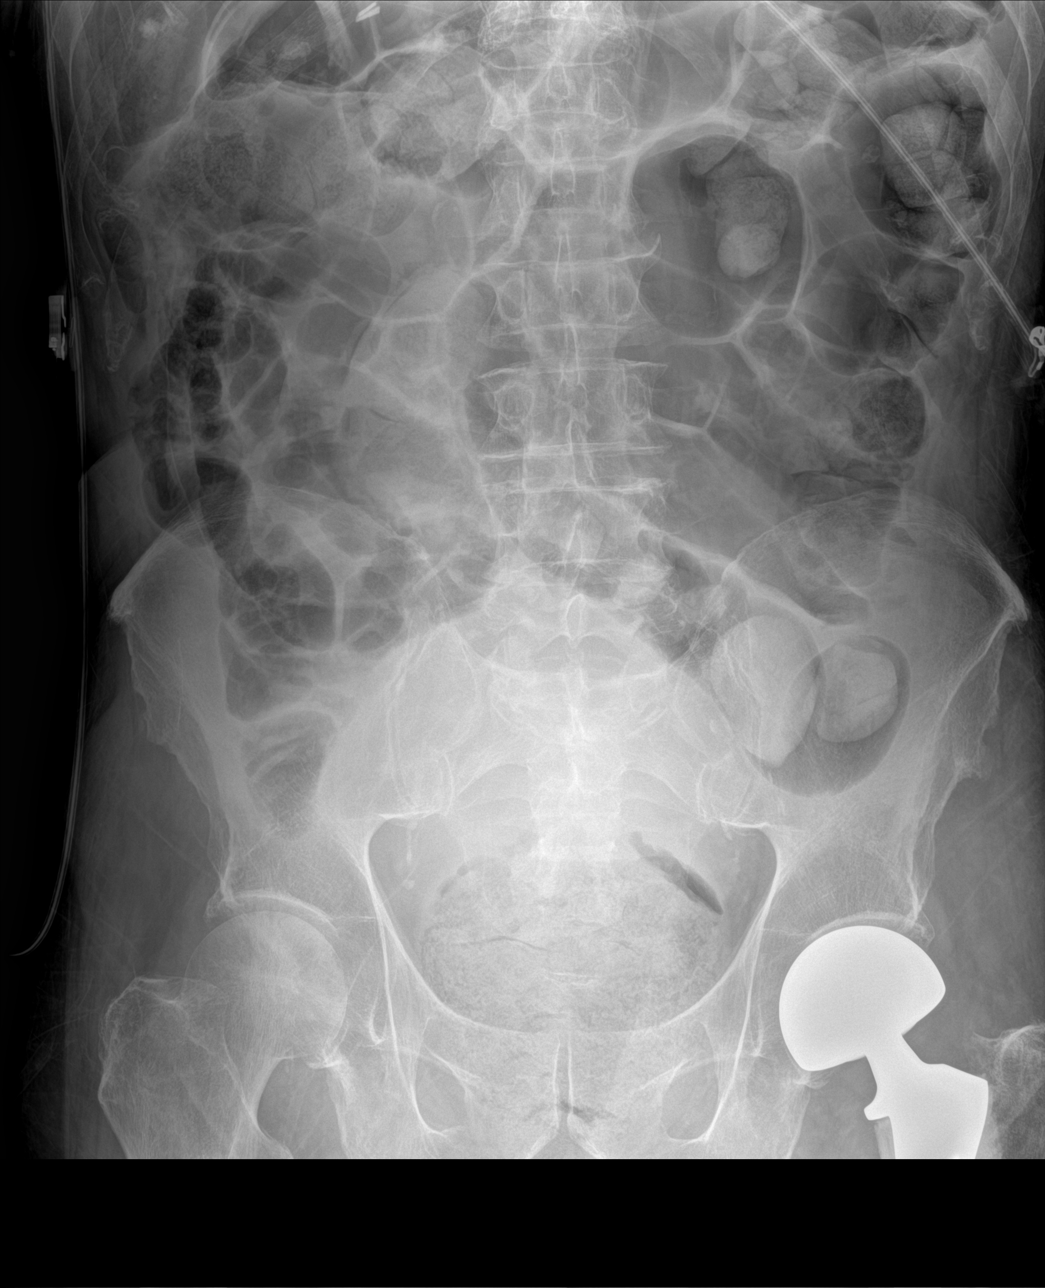

[3 of 3 positions shown; findings below may reference images not displayed]

FINDINGS: The lungs are clear wiithout focal pneumonia, edema, pneumothorax or
pleural effusion. Interstitial markings are diffusely coarsened with
chronic features. The cardiopericardial silhouette is within normal
limits for size. Marked gaseous distension of large and small bowel
is identified in the abdomen with prominent stool volume noted in
the rectum. Bones are diffusely demineralized. Patient is status
post left hip replacement.
IMPRESSION: 1. No acute cardiopulmonary findings.
2. Marked gaseous dilatation of large and small bowel, but
predominantly colon. Imaging features could be compatible with
severe colonic dysmotility although large stool volume in the rectum
raises concern for fecal impaction.

## 2017-04-18 IMAGING — DX DG CHEST 1V PORT
1 series · 1 of 1 positions shown · non-contrast
Comparison: 12/12/2015

CLINICAL DATA: Hypotension, weakness and altered mental status.

EXAM:
PORTABLE CHEST 1 VIEW

[chest ap]
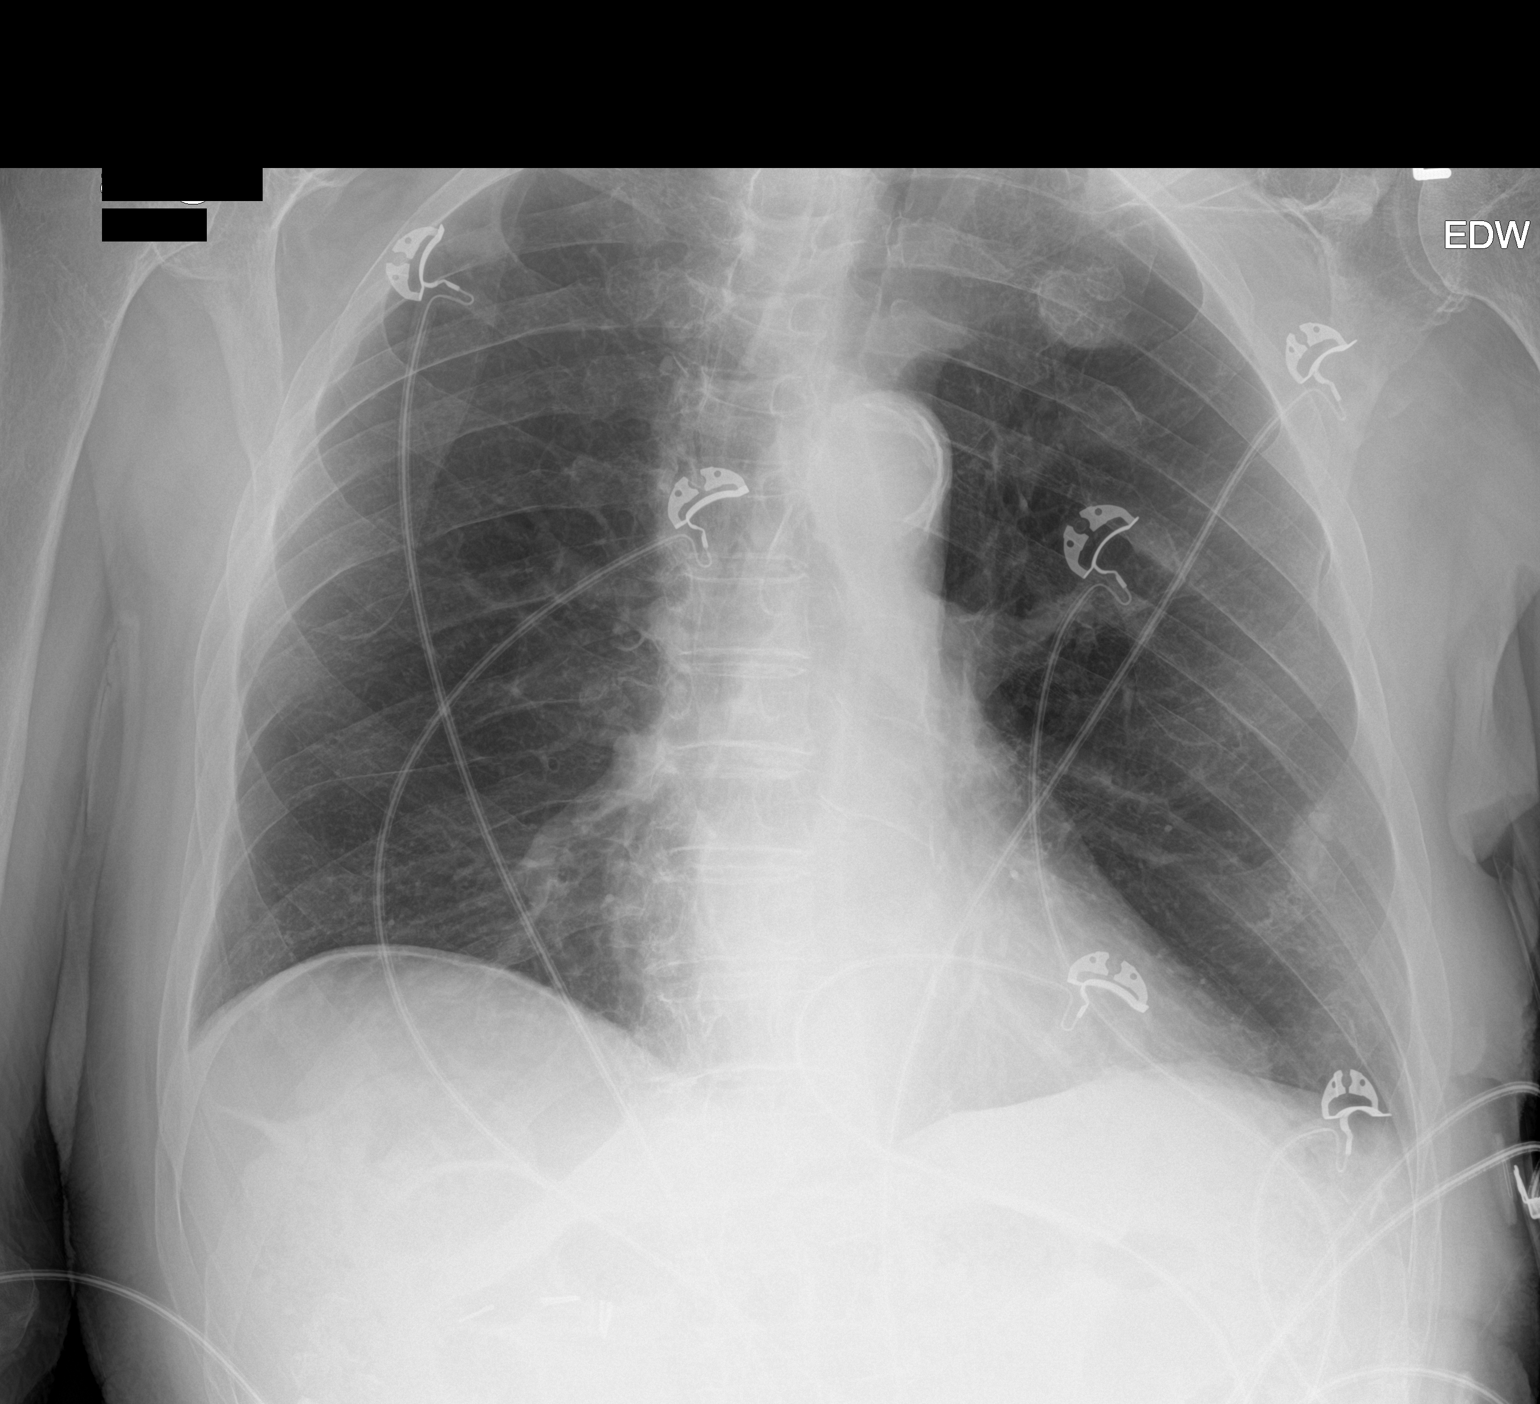

[1 of 1 positions shown; findings below may reference images not displayed]

FINDINGS: Cardiomediastinal silhouette is normal. Mediastinal contours appear
intact. Calcific atherosclerotic disease of the aorta noted.

There is no evidence of focal airspace consolidation, pleural
effusion or pneumothorax. Left basilar opacities again seen, thought
to represent pleural calcifications.

Osseous structures are without acute abnormality. Soft tissues are
grossly normal.
IMPRESSION: No active disease.

Calcific atherosclerotic disease of the aorta.
